# Patient Record
Sex: Female | Born: 1970
Health system: Southern US, Community
[De-identification: ages and names within clinical notes are randomized; demographics above are authoritative.]

## PROBLEM LIST (undated history)

## (undated) DIAGNOSIS — T4145XA Adverse effect of unspecified anesthetic, initial encounter: Secondary | ICD-10-CM

## (undated) DIAGNOSIS — T8859XA Other complications of anesthesia, initial encounter: Secondary | ICD-10-CM

## (undated) DIAGNOSIS — E119 Type 2 diabetes mellitus without complications: Secondary | ICD-10-CM

## (undated) DIAGNOSIS — Z9889 Other specified postprocedural states: Secondary | ICD-10-CM

## (undated) DIAGNOSIS — E785 Hyperlipidemia, unspecified: Secondary | ICD-10-CM

## (undated) DIAGNOSIS — F329 Major depressive disorder, single episode, unspecified: Secondary | ICD-10-CM

## (undated) DIAGNOSIS — J189 Pneumonia, unspecified organism: Secondary | ICD-10-CM

## (undated) DIAGNOSIS — J45909 Unspecified asthma, uncomplicated: Secondary | ICD-10-CM

## (undated) DIAGNOSIS — G4730 Sleep apnea, unspecified: Secondary | ICD-10-CM

## (undated) DIAGNOSIS — K219 Gastro-esophageal reflux disease without esophagitis: Secondary | ICD-10-CM

## (undated) DIAGNOSIS — M549 Dorsalgia, unspecified: Secondary | ICD-10-CM

## (undated) DIAGNOSIS — M2550 Pain in unspecified joint: Secondary | ICD-10-CM

## (undated) DIAGNOSIS — Z8709 Personal history of other diseases of the respiratory system: Secondary | ICD-10-CM

## (undated) DIAGNOSIS — F32A Depression, unspecified: Secondary | ICD-10-CM

## (undated) DIAGNOSIS — E669 Obesity, unspecified: Secondary | ICD-10-CM

## (undated) DIAGNOSIS — R112 Nausea with vomiting, unspecified: Secondary | ICD-10-CM

## (undated) DIAGNOSIS — F419 Anxiety disorder, unspecified: Secondary | ICD-10-CM

## (undated) DIAGNOSIS — K589 Irritable bowel syndrome without diarrhea: Secondary | ICD-10-CM

## (undated) HISTORY — DX: Hyperlipidemia, unspecified: E78.5

## (undated) HISTORY — DX: Dorsalgia, unspecified: M54.9

## (undated) HISTORY — DX: Gastro-esophageal reflux disease without esophagitis: K21.9

## (undated) HISTORY — DX: Sleep apnea, unspecified: G47.30

## (undated) HISTORY — PX: ESOPHAGOPLASTY: SUR459

## (undated) HISTORY — DX: Irritable bowel syndrome, unspecified: K58.9

## (undated) HISTORY — DX: Pain in unspecified joint: M25.50

## (undated) HISTORY — DX: Depression, unspecified: F32.A

## (undated) HISTORY — DX: Major depressive disorder, single episode, unspecified: F32.9

## (undated) HISTORY — PX: TONSILLECTOMY: SUR1361

---
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
## 2005-07-25 ENCOUNTER — Ambulatory Visit (HOSPITAL_COMMUNITY): Admission: RE | Admit: 2005-07-25 | Discharge: 2005-07-25 | Payer: Self-pay | Admitting: Family Medicine

## 2009-12-12 ENCOUNTER — Emergency Department (HOSPITAL_COMMUNITY): Admission: EM | Admit: 2009-12-12 | Discharge: 2009-12-12 | Payer: Self-pay | Admitting: Emergency Medicine

## 2010-01-25 ENCOUNTER — Ambulatory Visit (HOSPITAL_COMMUNITY): Admission: RE | Admit: 2010-01-25 | Discharge: 2010-01-25 | Payer: Self-pay | Admitting: Family Medicine

## 2010-07-02 ENCOUNTER — Emergency Department (HOSPITAL_COMMUNITY): Admission: EM | Admit: 2010-07-02 | Discharge: 2010-07-02 | Payer: Self-pay | Admitting: Family Medicine

## 2010-08-13 ENCOUNTER — Emergency Department (HOSPITAL_COMMUNITY): Admission: EM | Admit: 2010-08-13 | Discharge: 2010-08-13 | Payer: Self-pay | Admitting: Emergency Medicine

## 2010-08-28 ENCOUNTER — Encounter: Admission: RE | Admit: 2010-08-28 | Discharge: 2010-09-26 | Payer: Self-pay | Admitting: Orthopedic Surgery

## 2011-01-29 ENCOUNTER — Ambulatory Visit (HOSPITAL_COMMUNITY)
Admission: RE | Admit: 2011-01-29 | Discharge: 2011-01-29 | Payer: Self-pay | Source: Home / Self Care | Attending: Family Medicine | Admitting: Family Medicine

## 2011-06-19 ENCOUNTER — Inpatient Hospital Stay (INDEPENDENT_AMBULATORY_CARE_PROVIDER_SITE_OTHER)
Admission: RE | Admit: 2011-06-19 | Discharge: 2011-06-19 | Disposition: A | Discharge status: Home / Self Care | Payer: 59 | Source: Ambulatory Visit | Attending: Family Medicine | Admitting: Family Medicine

## 2011-06-19 ENCOUNTER — Inpatient Hospital Stay (HOSPITAL_COMMUNITY)
Admission: RE | Admit: 2011-06-19 | Discharge: 2011-06-19 | Disposition: A | Discharge status: Home / Self Care | Payer: 59 | Source: Ambulatory Visit

## 2011-06-19 DIAGNOSIS — S0083XA Contusion of other part of head, initial encounter: Secondary | ICD-10-CM

## 2011-06-19 DIAGNOSIS — S93409A Sprain of unspecified ligament of unspecified ankle, initial encounter: Secondary | ICD-10-CM

## 2012-01-13 ENCOUNTER — Other Ambulatory Visit (HOSPITAL_COMMUNITY): Payer: Self-pay | Admitting: Family Medicine

## 2012-01-13 DIAGNOSIS — Z1231 Encounter for screening mammogram for malignant neoplasm of breast: Secondary | ICD-10-CM

## 2012-01-15 ENCOUNTER — Encounter (HOSPITAL_COMMUNITY): Payer: Self-pay

## 2012-01-15 ENCOUNTER — Emergency Department (HOSPITAL_COMMUNITY)
Admission: EM | Admit: 2012-01-15 | Discharge: 2012-01-15 | Disposition: A | Discharge status: Home / Self Care | Payer: 59 | Source: Home / Self Care | Attending: Emergency Medicine | Admitting: Emergency Medicine

## 2012-01-15 ENCOUNTER — Other Ambulatory Visit: Payer: Self-pay

## 2012-01-15 DIAGNOSIS — K279 Peptic ulcer, site unspecified, unspecified as acute or chronic, without hemorrhage or perforation: Secondary | ICD-10-CM

## 2012-01-15 DIAGNOSIS — A048 Other specified bacterial intestinal infections: Secondary | ICD-10-CM

## 2012-01-15 HISTORY — DX: Anxiety disorder, unspecified: F41.9

## 2012-01-15 HISTORY — DX: Obesity, unspecified: E66.9

## 2012-01-15 LAB — CBC
HCT: 42.7 % (ref 36.0–46.0)
Hemoglobin: 14.5 g/dL (ref 12.0–15.0)
RBC: 4.9 MIL/uL (ref 3.87–5.11)
WBC: 13.3 10*3/uL — ABNORMAL HIGH (ref 4.0–10.5)

## 2012-01-15 LAB — DIFFERENTIAL
Basophils Absolute: 0.1 10*3/uL (ref 0.0–0.1)
Basophils Relative: 0 % (ref 0–1)
Lymphocytes Relative: 18 % (ref 12–46)
Lymphs Abs: 2.4 10*3/uL (ref 0.7–4.0)
Monocytes Absolute: 0.7 10*3/uL (ref 0.1–1.0)
Monocytes Relative: 5 % (ref 3–12)

## 2012-01-15 LAB — POCT URINALYSIS DIP (DEVICE)
Leukocytes, UA: NEGATIVE
Nitrite: NEGATIVE
Protein, ur: NEGATIVE mg/dL
Specific Gravity, Urine: <=1.005 (ref 1.005–1.030)
Urobilinogen, UA: 0.2 mg/dL (ref 0.0–1.0)

## 2012-01-15 LAB — POCT H PYLORI SCREEN: H. PYLORI SCREEN, POC: POSITIVE — AB

## 2012-01-15 MED ORDER — CLARITHROMYCIN 500 MG PO TABS: 500.0000 mg | ORAL_TABLET | Freq: Two times a day (BID) | ORAL | Status: AC

## 2012-01-15 MED ORDER — OMEPRAZOLE 20 MG PO CPDR: 20.0000 mg | DELAYED_RELEASE_CAPSULE | Freq: Two times a day (BID) | ORAL | Status: DC

## 2012-01-15 MED ORDER — AMOXICILLIN 500 MG PO CAPS: 1000.0000 mg | ORAL_CAPSULE | Freq: Two times a day (BID) | ORAL | Status: AC

## 2012-01-15 NOTE — ED Provider Notes (Signed)
History     CSN: [id number]  Arrival date & time 01/15/12  1110   First MD Initiated Contact with Patient 01/15/12 1115      Chief Complaint  Patient presents with  . Abdominal Pain    (Consider location/radiation/quality/duration/timing/severity/associated sxs/prior treatment) HPI Comments: Pt with intermittent cramping, "gnawing" nonradiating upper abd pain x 3 days. Episodes occur several hours after eating and last for approx 45 min. Had nausea and 2 episodes of nonbloody loose stools this am with sx. Sx have now completely resolved. Pain worse with eating, better with fasting.   Pt did change to high protein, low fat, low carb diet last week in effort to lose more weight. No vomiting, fevers, diaphoresis, CP, SOB, abd distension, urinary c/o, vaginal c/o. Similar sx before, was thought to be gallstones but never had u/s. Has been taking nexium over past 2 days w/o relief.   Patient is a 41 y.o. female presenting with abdominal pain. The history is provided by the patient.  Abdominal Pain The primary symptoms of the illness include abdominal pain. The current episode started more than 2 days ago. The onset of the illness was sudden. The problem has not changed since onset. The patient states that she believes she is currently not pregnant. The patient has not had a change in bowel habit. Symptoms associated with the illness do not include chills, anorexia, diaphoresis, heartburn, constipation, urgency, hematuria, frequency or back pain.    Past Medical History  Diagnosis Date  . Anxiety   . Obesity     Past Surgical History  Procedure Date  . Cesarean section     History reviewed. No pertinent family history.  History  Substance Use Topics  . Smoking status: Former Games developer  . Smokeless tobacco: Not on file  . Alcohol Use: No    OB History    Grav Para Term Preterm Abortions TAB SAB Ect Mult Living                  Review of Systems  Constitutional: Negative for  chills and diaphoresis.  Respiratory: Negative for cough, chest tightness and wheezing.   Cardiovascular: Negative for chest pain.  Gastrointestinal: Positive for abdominal pain. Negative for heartburn, constipation and anorexia.  Genitourinary: Negative for urgency, frequency and hematuria.  Musculoskeletal: Negative for back pain.    Allergies  Sulfa antibiotics  Home Medications   Current Outpatient Rx  Name Route Sig Dispense Refill  . ALPRAZOLAM 0.5 MG PO TABS Oral Take 0.5 mg by mouth at bedtime as needed.    Marland Kitchen FLUOXETINE HCL 20 MG PO CAPS Oral Take 20 mg by mouth daily.    . AMOXICILLIN 500 MG PO CAPS Oral Take 2 capsules (1,000 mg total) by mouth 2 (two) times daily. X 10 days 20 capsule 0  . CLARITHROMYCIN 500 MG PO TABS Oral Take 1 tablet (500 mg total) by mouth 2 (two) times daily. X 10 days 20 tablet 0  . OMEPRAZOLE 20 MG PO CPDR Oral Take 1 capsule (20 mg total) by mouth 2 (two) times daily. X 10 days 40 capsule 0    BP 117/68  Pulse 65  Temp(Src) 97.7 F (36.5 C) (Oral)  SpO2 98%  LMP 01/04/2012  Physical Exam  Nursing note and vitals reviewed. Constitutional: She is oriented to person, place, and time. She appears well-developed and well-nourished.  HENT:  Head: Normocephalic and atraumatic.  Eyes: Conjunctivae and EOM are normal.  Neck: Normal range of motion.  Cardiovascular:  Normal rate, regular rhythm, normal heart sounds and intact distal pulses.   No murmur heard. Pulmonary/Chest: Effort normal and breath sounds normal. No respiratory distress. She has no wheezes. She has no rales. She exhibits no tenderness.  Abdominal: Soft. Normal appearance and bowel sounds are normal. She exhibits no distension and no mass. There is no tenderness. There is no rigidity, no rebound, no guarding, no CVA tenderness and negative Murphy's sign.  Musculoskeletal: Normal range of motion. She exhibits no edema and no tenderness.  Neurological: She is alert and oriented to  person, place, and time.  Skin: Skin is warm and dry.  Psychiatric: She has a normal mood and affect. Her behavior is normal. Judgment and thought content normal.    ED Course  Procedures (including critical care time)  Labs Reviewed  POCT H PYLORI SCREEN - Abnormal; Notable for the following:    H. PYLORI SCREEN, POC POSITIVE (*)    All other components within normal limits  CBC - Abnormal; Notable for the following:    WBC 13.3 (*)    All other components within normal limits  DIFFERENTIAL - Abnormal; Notable for the following:    Neutro Abs 9.9 (*)    All other components within normal limits  POCT URINALYSIS DIP (DEVICE)  LAB REPORT - SCANNED  POCT URINALYSIS DIPSTICK   No results found.   1. H. pylori infection   2. PUD (peptic ulcer disease)      Results for orders placed during the hospital encounter of 01/15/12  POCT H PYLORI SCREEN      Component Value Range   H. PYLORI SCREEN, POC POSITIVE (*) NEGATIVE   CBC      Component Value Range   WBC 13.3 (*) 4.0 - 10.5 (K/uL)   RBC 4.90  3.87 - 5.11 (MIL/uL)   Hemoglobin 14.5  12.0 - 15.0 (g/dL)   HCT 40.9  81.1 - 91.4 (%)   MCV 87.1  78.0 - 100.0 (fL)   MCH 29.6  26.0 - 34.0 (pg)   MCHC 34.0  30.0 - 36.0 (g/dL)   RDW 78.2  95.6 - 21.3 (%)   Platelets 326  150 - 400 (K/uL)  DIFFERENTIAL      Component Value Range   Neutrophils Relative 75  43 - 77 (%)   Neutro Abs 9.9 (*) 1.7 - 7.7 (K/uL)   Lymphocytes Relative 18  12 - 46 (%)   Lymphs Abs 2.4  0.7 - 4.0 (K/uL)   Monocytes Relative 5  3 - 12 (%)   Monocytes Absolute 0.7  0.1 - 1.0 (K/uL)   Eosinophils Relative 1  0 - 5 (%)   Eosinophils Absolute 0.2  0.0 - 0.7 (K/uL)   Basophils Relative 0  0 - 1 (%)   Basophils Absolute 0.1  0.0 - 0.1 (K/uL)  POCT URINALYSIS DIP (DEVICE)      Component Value Range   Glucose, UA NEGATIVE  NEGATIVE (mg/dL)   Bilirubin Urine NEGATIVE  NEGATIVE    Ketones, ur NEGATIVE  NEGATIVE (mg/dL)   Specific Gravity, Urine <=1.005   1.005 - 1.030    Hgb urine dipstick NEGATIVE  NEGATIVE    pH 6.0  5.0 - 8.0    Protein, ur NEGATIVE  NEGATIVE (mg/dL)   Urobilinogen, UA 0.2  0.0 - 1.0 (mg/dL)   Nitrite NEGATIVE  NEGATIVE    Leukocytes, UA NEGATIVE  NEGATIVE     MDM  Previous chart, labs, imaging reviewed.  Date: 01/15/2012  Rate: 69  Rhythm: normal sinus rhythm  QRS Axis: normal  Intervals: normal  ST/T Wave abnormalities: normal  Conduction Disutrbances:none  Narrative Interpretation:   Old EKG Reviewed: none available  Checking EKG, CBC, h pylori. No jaundice. If all neg think that this is most likely cholelithiasis as pt is in 40's, parous, overweight. Pt currenly pain free, VS acceptable, think that pt can have OP workup if no red flags here by PMD. Patient led H. pylori gastritis. Sending home with amoxicillin Biaxin and Prilosec.   On re-evaluation, pt comfortable, VSS, no complaints.   Discussed lab results with patient Emphasized importance of f/u. Pt  agrees.   PMD- Dr. Keturah Barre in [facility]  Luiz Blare, MD 01/16/12 [phone]

## 2012-01-15 NOTE — ED Notes (Signed)
States has been having upper abdominal pain w radiation to mid abdomin, comes and goes for several days; c/o pain @ 10 when comes on, goes away after 30-45 min w rest and heat; has lost weight w diet and exercise; NAD at present, denies pain at present

## 2012-02-11 ENCOUNTER — Ambulatory Visit (HOSPITAL_COMMUNITY)
Admission: RE | Admit: 2012-02-11 | Discharge: 2012-02-11 | Disposition: A | Discharge status: Home / Self Care | Payer: 59 | Source: Ambulatory Visit | Attending: Family Medicine | Admitting: Family Medicine

## 2012-02-11 DIAGNOSIS — Z1231 Encounter for screening mammogram for malignant neoplasm of breast: Secondary | ICD-10-CM | POA: Insufficient documentation

## 2012-02-18 ENCOUNTER — Other Ambulatory Visit: Payer: Self-pay | Admitting: Family Medicine

## 2012-02-18 DIAGNOSIS — R928 Other abnormal and inconclusive findings on diagnostic imaging of breast: Secondary | ICD-10-CM

## 2012-02-24 ENCOUNTER — Encounter (HOSPITAL_COMMUNITY): Payer: Self-pay | Admitting: Emergency Medicine

## 2012-02-24 ENCOUNTER — Emergency Department (HOSPITAL_COMMUNITY)
Admission: EM | Admit: 2012-02-24 | Discharge: 2012-02-24 | Disposition: A | Discharge status: Home / Self Care | Payer: 59 | Source: Home / Self Care | Attending: Emergency Medicine | Admitting: Emergency Medicine

## 2012-02-24 DIAGNOSIS — J069 Acute upper respiratory infection, unspecified: Secondary | ICD-10-CM

## 2012-02-24 MED ORDER — AMOXICILLIN-POT CLAVULANATE 875-125 MG PO TABS: 1.0000 | ORAL_TABLET | Freq: Two times a day (BID) | ORAL | Status: AC

## 2012-02-24 MED ORDER — FLUTICASONE PROPIONATE 50 MCG/ACT NA SUSP: 2.0000 | Freq: Every day | NASAL | Status: DC

## 2012-02-24 NOTE — ED Notes (Signed)
Pt  Has  Symptoms of  Congestion  As  Well   As   sorethroat     And  Earache    Symptoms  X  3  Days   Pt  Is sitting upright on  Exam table  Appears in no   Severe  Distress     Speaking in  Complete  sentances   Symptoms  X  3  Days

## 2012-02-24 NOTE — ED Provider Notes (Signed)
Chief Complaint  Patient presents with  . Otalgia    History of Present Illness:   The patient is a 41 year old female who presents today with a four-day history of left ear fullness, nasal congestion, clear rhinorrhea and sneezing. She also has had a slight sore throat, postnasal drip, dry cough and some wheezing. She denies any fever or chills. She denies any pain in the ear.  Review of Systems:  Other than noted above, the patient denies any of the following symptoms. Systemic:  No fever, chills, sweats, fatigue, myalgias, headache, or anorexia. Eye:  No redness, pain or drainage. ENT:  No earache, nasal congestion, rhinorrhea, sinus pressure, or sore throat. Lungs:  No cough, sputum production, wheezing, shortness of breath. Or chest pain. GI:  No nausea, vomiting, abdominal pain or diarrhea. Skin:  No rash or itching.  PMFSH:  Past medical history, family history, social history, meds, and allergies were reviewed.  Physical Exam:   Vital signs:  BP 112/59  Pulse 70  Temp(Src) 98.1 F (36.7 C) (Oral)  Resp 16  SpO2 100%  LMP 02/01/2012 General:  Alert, in no distress. Eye:  No conjunctival injection or drainage. ENT:  TMs and canals were normal, without erythema or inflammation.  Nasal mucosa was congested, without drainage.  Mucous membranes were moist.  Pharynx was clear, without exudate or drainage.  There were no oral ulcerations or lesions. Neck:  Supple, no adenopathy, tenderness or mass. Lungs:  No respiratory distress.  Lungs were clear to auscultation, without wheezes, rales or rhonchi.  Breath sounds were clear and equal bilaterally. Heart:  Regular rhythm, without gallops, murmers or rubs. Skin:  Clear, warm, and dry, without rash or lesions.   Assessment:   Diagnoses that have been ruled out:  None  Diagnoses that are still under consideration:  None  Final diagnoses:  Viral upper respiratory infection      Plan:   1.  The following meds were prescribed:     New Prescriptions   AMOXICILLIN-CLAVULANATE (AUGMENTIN) 875-125 MG PER TABLET    Take 1 tablet by mouth 2 (two) times daily.   FLUTICASONE (FLONASE) 50 MCG/ACT NASAL SPRAY    Place 2 sprays into the nose daily.   2.  The patient was instructed in symptomatic care and handouts were given. 3.  The patient was told to return if becoming worse in any way, if no better in 3 or 4 days, and given some red flag symptoms that would indicate earlier return.   Roque Lias, MD 02/24/12 [phone]

## 2012-02-24 NOTE — Discharge Instructions (Signed)

## 2012-02-26 ENCOUNTER — Ambulatory Visit
Admission: RE | Admit: 2012-02-26 | Discharge: 2012-02-26 | Disposition: A | Discharge status: Home / Self Care | Payer: 59 | Source: Ambulatory Visit | Attending: Family Medicine | Admitting: Family Medicine

## 2012-02-26 DIAGNOSIS — R928 Other abnormal and inconclusive findings on diagnostic imaging of breast: Secondary | ICD-10-CM

## 2012-09-10 ENCOUNTER — Emergency Department (HOSPITAL_COMMUNITY)
Admission: EM | Admit: 2012-09-10 | Discharge: 2012-09-10 | Disposition: A | Discharge status: Home / Self Care | Payer: 59 | Source: Home / Self Care | Attending: Family Medicine | Admitting: Family Medicine

## 2012-09-10 ENCOUNTER — Encounter (HOSPITAL_COMMUNITY): Payer: Self-pay | Admitting: Emergency Medicine

## 2012-09-10 DIAGNOSIS — L089 Local infection of the skin and subcutaneous tissue, unspecified: Secondary | ICD-10-CM

## 2012-09-10 MED ORDER — DOXYCYCLINE HYCLATE 100 MG PO CAPS: 100.0000 mg | ORAL_CAPSULE | Freq: Two times a day (BID) | ORAL | Status: DC

## 2012-09-10 MED ORDER — IBUPROFEN 800 MG PO TABS: 800.0000 mg | ORAL_TABLET | Freq: Three times a day (TID) | ORAL | Status: AC

## 2012-09-10 MED ORDER — IBUPROFEN 800 MG PO TABS: 800.0000 mg | ORAL_TABLET | Freq: Three times a day (TID) | ORAL | Status: DC

## 2012-09-10 MED ORDER — CEPHALEXIN 500 MG PO CAPS: 500.0000 mg | ORAL_CAPSULE | Freq: Four times a day (QID) | ORAL | Status: AC

## 2012-09-10 MED ORDER — DOXYCYCLINE HYCLATE 100 MG PO CAPS: 100.0000 mg | ORAL_CAPSULE | Freq: Two times a day (BID) | ORAL | Status: AC

## 2012-09-10 MED ORDER — CEPHALEXIN 500 MG PO CAPS: 500.0000 mg | ORAL_CAPSULE | Freq: Four times a day (QID) | ORAL | Status: DC

## 2012-09-10 NOTE — ED Notes (Signed)
PT HERE WITH C/O POSS PARONYCHIA TO RIGHT HAND MIDDLE DISTAL FINGER THAT NOTICED THIS AM.SOAKED IN WARM WATER BUT THROB PAIN NOTED

## 2012-09-10 NOTE — ED Provider Notes (Signed)
History     CSN: [id number]  Arrival date & time 09/10/12  4098   First MD Initiated Contact with Patient 09/10/12 1846      Chief Complaint  Patient presents with  . Nail Problem    (Consider location/radiation/quality/duration/timing/severity/associated sxs/prior treatment) Patient is a 41 y.o. female presenting with hand pain. The history is provided by the patient. No language interpreter was used.  Hand Pain This is a new problem. The current episode started yesterday. The problem occurs constantly. The problem has been gradually worsening. Nothing aggravates the symptoms. Nothing relieves the symptoms. She has tried nothing for the symptoms. The treatment provided no relief.  Pt complains of swelling to his right 3rd finger.  Past Medical History  Diagnosis Date  . Anxiety   . Obesity     Past Surgical History  Procedure Date  . Cesarean section     No family history on file.  History  Substance Use Topics  . Smoking status: Former Games developer  . Smokeless tobacco: Not on file  . Alcohol Use: No    OB History    Grav Para Term Preterm Abortions TAB SAB Ect Mult Living                  Review of Systems  Musculoskeletal: Positive for joint swelling.  All other systems reviewed and are negative.    Allergies  Sulfa antibiotics  Home Medications   Current Outpatient Rx  Name Route Sig Dispense Refill  . ALPRAZOLAM 0.5 MG PO TABS Oral Take 0.5 mg by mouth at bedtime as needed.    . CEPHALEXIN 500 MG PO CAPS Oral Take 1 capsule (500 mg total) by mouth 4 (four) times daily. 40 capsule 0  . DOXYCYCLINE HYCLATE 100 MG PO CAPS Oral Take 1 capsule (100 mg total) by mouth 2 (two) times daily. 20 capsule 0  . FLUOXETINE HCL 20 MG PO CAPS Oral Take 20 mg by mouth daily.    Marland Kitchen FLUTICASONE PROPIONATE 50 MCG/ACT NA SUSP Nasal Place 2 sprays into the nose daily. 16 g 0  . IBUPROFEN 800 MG PO TABS Oral Take 1 tablet (800 mg total) by mouth 3 (three) times daily. 21  tablet 0  . OMEPRAZOLE 20 MG PO CPDR Oral Take 1 capsule (20 mg total) by mouth 2 (two) times daily. X 10 days 40 capsule 0    BP 121/78  Pulse 78  Temp 98.9 F (37.2 C) (Oral)  Resp 14  SpO2 97%  LMP 09/10/2012  Physical Exam  Nursing note and vitals reviewed. Constitutional: She is oriented to person, place, and time. She appears well-developed and well-nourished.  HENT:  Head: Normocephalic and atraumatic.  Musculoskeletal: She exhibits tenderness.       Tender middle finger,  Slight erythema distal tip,  No obvious paronychia  nv and ns intact  Neurological: She is alert and oriented to person, place, and time. She has normal reflexes.  Skin: Skin is warm and dry.  Psychiatric: She has a normal mood and affect.    ED Course  Procedures (including critical care time)  Labs Reviewed - No data to display No results found.   1. Finger infection       MDM  Pt advised soak area, recheck in 2 days,  Keflex and doxycycline        Lonia Skinner [place], [place] 09/10/12 2022

## 2012-09-12 NOTE — ED Provider Notes (Signed)
Medical screening examination/treatment/procedure(s) were performed by resident physician or non-physician practitioner and as supervising physician I was immediately available for consultation/collaboration.   Severo Beber DOUGLAS MD.    Valaree Fresquez D Patience Nuzzo, MD 09/12/12 1046 

## 2012-09-13 ENCOUNTER — Encounter (HOSPITAL_COMMUNITY): Payer: Self-pay | Admitting: *Deleted

## 2012-09-13 ENCOUNTER — Emergency Department (HOSPITAL_COMMUNITY)
Admission: EM | Admit: 2012-09-13 | Discharge: 2012-09-13 | Disposition: A | Discharge status: Home / Self Care | Payer: 59 | Source: Home / Self Care | Attending: Family Medicine | Admitting: Family Medicine

## 2012-09-13 DIAGNOSIS — L03019 Cellulitis of unspecified finger: Secondary | ICD-10-CM

## 2012-09-13 DIAGNOSIS — IMO0001 Reserved for inherently not codable concepts without codable children: Secondary | ICD-10-CM

## 2012-09-13 MED ORDER — TRAMADOL HCL 50 MG PO TABS: 50.0000 mg | ORAL_TABLET | Freq: Four times a day (QID) | ORAL | Status: AC | PRN

## 2012-09-13 MED ORDER — HYDROCODONE-ACETAMINOPHEN 5-325 MG PO TABS: 1.0000 | ORAL_TABLET | Freq: Four times a day (QID) | ORAL | Status: AC | PRN

## 2012-09-13 NOTE — ED Notes (Signed)
Betadine soak. 

## 2012-09-13 NOTE — ED Provider Notes (Signed)
History     CSN: [id number]  Arrival date & time 09/13/12  1113   First MD Initiated Contact with Patient 09/13/12 1115      Chief Complaint  Patient presents with  . Wound Check    (Consider location/radiation/quality/duration/timing/severity/associated sxs/prior treatment) Patient is a 41 y.o. female presenting with hand pain. The history is provided by the patient.  Hand Pain This is a new problem. The current episode started more than 2 days ago (seen 9/12 for paronychia of rmf, sx continue.). The problem has not changed since onset.   Past Medical History  Diagnosis Date  . Anxiety   . Obesity     Past Surgical History  Procedure Date  . Cesarean section     History reviewed. No pertinent family history.  History  Substance Use Topics  . Smoking status: Former Games developer  . Smokeless tobacco: Not on file  . Alcohol Use: No    OB History    Grav Para Term Preterm Abortions TAB SAB Ect Mult Living                  Review of Systems  Constitutional: Negative.   Skin: Positive for rash.    Allergies  Rifampin and Sulfa antibiotics  Home Medications   Current Outpatient Rx  Name Route Sig Dispense Refill  . ALPRAZOLAM 0.5 MG PO TABS Oral Take 0.5 mg by mouth at bedtime as needed.    . CEPHALEXIN 500 MG PO CAPS Oral Take 1 capsule (500 mg total) by mouth 4 (four) times daily. 40 capsule 0  . DOXYCYCLINE HYCLATE 100 MG PO CAPS Oral Take 1 capsule (100 mg total) by mouth 2 (two) times daily. 20 capsule 0  . ESOMEPRAZOLE MAGNESIUM 40 MG PO CPDR Oral Take 40 mg by mouth daily before breakfast.    . FLUOXETINE HCL 20 MG PO CAPS Oral Take 20 mg by mouth daily.    . IBUPROFEN 800 MG PO TABS Oral Take 1 tablet (800 mg total) by mouth 3 (three) times daily. 21 tablet 0  . OMEPRAZOLE 20 MG PO CPDR Oral Take 1 capsule (20 mg total) by mouth 2 (two) times daily. X 10 days 40 capsule 0  . FLUTICASONE PROPIONATE 50 MCG/ACT NA SUSP Nasal Place 2 sprays into the nose  daily. 16 g 0  . HYDROCODONE-ACETAMINOPHEN 5-325 MG PO TABS Oral Take 1 tablet by mouth every 6 (six) hours as needed for pain. 10 tablet 0    BP 112/67  Pulse 64  Temp 98.4 F (36.9 C) (Oral)  Resp 18  SpO2 99%  LMP 09/10/2012  Physical Exam  Nursing note and vitals reviewed. Constitutional: She is oriented to person, place, and time. She appears well-developed and well-nourished.  Musculoskeletal: She exhibits tenderness.       Hands: Neurological: She is alert and oriented to person, place, and time.  Skin: Skin is warm and dry.    ED Course  Drain paronychia Date/Time: 09/13/2012 12:07 PM Performed by: Linna Hoff Authorized by: Bradd Canary D Consent: Verbal consent obtained. Consent given by: patient Preparation: Patient was prepped and draped in the usual sterile fashion. Local anesthesia used: yes Anesthesia: local infiltration Local anesthetic: lidocaine 2% without epinephrine Patient sedated: no Patient tolerance: Patient tolerated the procedure well with no immediate complications. Comments: Culture obtained, betadine soaked., dsd   (including critical care time)   Labs Reviewed  WOUND CULTURE   No results found.   1. Paronychia of third finger,  right       MDM  I+d performed        Linna Hoff, MD 09/13/12 1212

## 2012-09-13 NOTE — ED Notes (Signed)
Here for recheck infection right middle finger - tenderness and swelling remain

## 2012-09-15 LAB — WOUND CULTURE

## 2013-03-01 ENCOUNTER — Other Ambulatory Visit (HOSPITAL_COMMUNITY): Payer: Self-pay | Admitting: Family Medicine

## 2013-03-01 DIAGNOSIS — Z1231 Encounter for screening mammogram for malignant neoplasm of breast: Secondary | ICD-10-CM

## 2013-03-09 ENCOUNTER — Ambulatory Visit (HOSPITAL_COMMUNITY)
Admission: RE | Admit: 2013-03-09 | Discharge: 2013-03-09 | Disposition: A | Discharge status: Home / Self Care | Payer: 59 | Source: Ambulatory Visit | Attending: Family Medicine | Admitting: Family Medicine

## 2013-03-09 DIAGNOSIS — Z1231 Encounter for screening mammogram for malignant neoplasm of breast: Secondary | ICD-10-CM | POA: Insufficient documentation

## 2014-02-24 ENCOUNTER — Other Ambulatory Visit (HOSPITAL_COMMUNITY): Payer: Self-pay | Admitting: Family Medicine

## 2014-02-24 DIAGNOSIS — Z1231 Encounter for screening mammogram for malignant neoplasm of breast: Secondary | ICD-10-CM

## 2014-03-14 ENCOUNTER — Ambulatory Visit (HOSPITAL_COMMUNITY)
Admission: RE | Admit: 2014-03-14 | Discharge: 2014-03-14 | Disposition: A | Discharge status: Home / Self Care | Payer: 59 | Source: Ambulatory Visit | Attending: Family Medicine | Admitting: Family Medicine

## 2014-03-14 DIAGNOSIS — Z1231 Encounter for screening mammogram for malignant neoplasm of breast: Secondary | ICD-10-CM

## 2014-06-02 ENCOUNTER — Ambulatory Visit (INDEPENDENT_AMBULATORY_CARE_PROVIDER_SITE_OTHER): Payer: 59 | Admitting: Family Medicine

## 2014-06-02 VITALS — BP 126/74 | HR 86 | Temp 97.9°F | Resp 16 | Ht 62.0 in | Wt 255.6 lb

## 2014-06-02 DIAGNOSIS — M25519 Pain in unspecified shoulder: Secondary | ICD-10-CM

## 2014-06-02 DIAGNOSIS — M67919 Unspecified disorder of synovium and tendon, unspecified shoulder: Secondary | ICD-10-CM

## 2014-06-02 DIAGNOSIS — M25511 Pain in right shoulder: Secondary | ICD-10-CM

## 2014-06-02 DIAGNOSIS — M719 Bursopathy, unspecified: Secondary | ICD-10-CM

## 2014-06-02 DIAGNOSIS — M7581 Other shoulder lesions, right shoulder: Secondary | ICD-10-CM

## 2014-06-02 DIAGNOSIS — E669 Obesity, unspecified: Secondary | ICD-10-CM | POA: Insufficient documentation

## 2014-06-02 MED ORDER — METHOCARBAMOL 500 MG PO TABS: 500.0000 mg | ORAL_TABLET | Freq: Three times a day (TID) | ORAL | Status: DC | PRN

## 2014-06-02 MED ORDER — MELOXICAM 7.5 MG PO TABS: 7.5000 mg | ORAL_TABLET | Freq: Every day | ORAL | Status: DC

## 2014-06-02 NOTE — Progress Notes (Signed)
[facility] [address] [place] [address] [phone]  Date:  06/02/2014   Name:  Stacey Clark   DOB:  [birth year]   MRN:  [id number]  PCP:  Myrlene Broker, MD    Chief Complaint: Shoulder Pain   History of Present Illness:  Stacey Clark is a 43 y.o. very pleasant female patient who presents with the following:  She is here today with a problem with her right shoulder.  She notes pain especially with abduction and flexion.   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   She had noted trouble with her shoulder for a few months, but just worse now over the last couple of days.   She is right handed.   She can have more pain when she lies down at night Taking OTC medications as needed No prior significant histoyr of shoulder trouble, no acute injury.    History of DM but no medicaitons at thsi time.    There are no active problems to display for this patient.   Past Medical History  Diagnosis Date  . Anxiety   . Obesity     Past Surgical History  Procedure Laterality Date  . Cesarean section      History  Substance Use Topics  . Smoking status: Former Research scientist (life sciences)  . Smokeless tobacco: Not on file  . Alcohol Use: No    No family history on file.  Allergies  Allergen Reactions  . Rifampin   . Sulfa Antibiotics     Medication list has been reviewed and updated.  Current Outpatient Prescriptions on File Prior to Visit  Medication Sig Dispense Refill  . ALPRAZolam (XANAX) 0.5 MG tablet Take 0.5 mg by mouth at bedtime as needed.      Marland Kitchen esomeprazole (NEXIUM) 40 MG capsule Take 40 mg by mouth daily before breakfast.      . FLUoxetine (PROZAC) 20 MG capsule Take 20 mg by mouth daily.      . fluticasone (FLONASE) 50 MCG/ACT nasal spray Place 2 sprays into the nose daily.  16 g  0  . omeprazole (PRILOSEC) 20 MG capsule Take 1 capsule (20 mg total) by mouth 2 (two) times daily. X 10 days   40 capsule  0   No current facility-administered medications on file prior to visit.    Review of Systems:  As per HPI- otherwise negative.   Physical Examination: Filed Vitals:   06/02/14 1615  BP: 126/74  Pulse: 86  Temp: 97.9 F (36.6 C)  Resp: 16   Filed Vitals:   06/02/14 1615  Height: 5\' 2"  (1.575 m)  Weight: 255 lb 9.6 oz (115.939 kg)   Body mass index is 46.74 kg/(m^2). Ideal Body Weight: Weight in (lb) to have BMI = 25: 136.4  GEN: WDWN, NAD, Non-toxic, A & O x 3, obese, looks well HEENT: Atraumatic, Normocephalic. Neck supple. No masses, No LAD. Ears and Nose: No external deformity. CV: RRR, No M/G/R. No JVD. No thrill. No extra heart sounds. PULM: CTA B, no wheezes, crackles, rhonchi. No retractions. No resp. distress. No accessory muscle use. EXTR: No c/c/e NEURO Normal gait.  PSYCH: Normally interactive. Conversant. Not depressed or anxious appearing.  Calm demeanor.  Right shoulder; she is tender over the anterior RCT insertion.  Able to flex to about 90 before she has pain. Abduct to 110 before she has pain.  External  rotation is ok, internal rotation is painful.  Normal strength, negative empty can test.    Assessment and Plan: Right rotator cuff tendonitis - Plan: meloxicam (MOBIC) 7.5 MG tablet, methocarbamol (ROBAXIN) 500 MG tablet  Right shoulder pain  Right shoulder strain.  Treat with mobic and robaxin as needed.  Gave hand- out with RCT exercises.   See patient instructions for more details.     Signed Lamar Blinks, MD

## 2014-06-02 NOTE — Patient Instructions (Signed)
Use your mobic (NSAID) once a day as needed- do not take with ibuprofen or aleve.   Also use the robaxin (muscle relaxer) as needed.  Remember it can make you a bit sleepy, especially if combined with xanax.    Avoid lifting your arms over your heartline when you are working.  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  Use the exercises on a regular basis, and let me know if you want to pursue formal physical therapy or an orthopedist visit in a couple of weeks

## 2014-11-27 ENCOUNTER — Ambulatory Visit (INDEPENDENT_AMBULATORY_CARE_PROVIDER_SITE_OTHER): Payer: 59 | Admitting: Family Medicine

## 2014-11-27 VITALS — BP 136/78 | HR 77 | Temp 97.9°F | Resp 16 | Ht 62.5 in | Wt 259.8 lb

## 2014-11-27 DIAGNOSIS — R05 Cough: Secondary | ICD-10-CM

## 2014-11-27 DIAGNOSIS — J069 Acute upper respiratory infection, unspecified: Secondary | ICD-10-CM

## 2014-11-27 DIAGNOSIS — R059 Cough, unspecified: Secondary | ICD-10-CM

## 2014-11-27 MED ORDER — BENZONATATE 100 MG PO CAPS: 100.0000 mg | ORAL_CAPSULE | Freq: Three times a day (TID) | ORAL | Status: DC | PRN

## 2014-11-27 MED ORDER — AZITHROMYCIN 250 MG PO TABS: ORAL_TABLET | ORAL | Status: DC

## 2014-11-27 NOTE — Progress Notes (Signed)
Subjective:    Patient ID: Stacey Clark, female    DOB: 1971-07-09, 43 y.o.   MRN: [id number] This chart was scribed for Stacey Ray, MD by Marti Sleigh, Medical Scribe. This patient was seen in Room 1 and the patient's care was started a 11:23 AM.    HPI HPI Comments: Stacey Clark is a 43 y.o. female who presents to [facility] complaining of productive sinus congestion with yellow purulent that started three days ago. Pt endorses ear pain, mild cough, swollen glands, and sick contacts. Pt's niece and nephew have both had recent URI with abx treatment. Pt states that she had slight trouble breathing last night - congested, but no wheeze.  Pt denies fever or SOB. Pt has been taking advil sinus and congestion OTC.     Patient Active Problem List   Diagnosis Date Noted  . Obesity, unspecified 06/02/2014   Past Medical History  Diagnosis Date  . Anxiety   . Obesity    Past Surgical History  Procedure Laterality Date  . Cesarean section     Allergies  Allergen Reactions  . Rifampin   . Sulfa Antibiotics    Prior to Admission medications   Medication Sig Start Date End Date Taking? Authorizing Provider  ALPRAZolam Duanne Moron) 0.5 MG tablet Take 0.5 mg by mouth at bedtime as needed.   Yes Historical Provider, MD  buPROPion (WELLBUTRIN SR) 150 MG 12 hr tablet Take 150 mg by mouth 2 (two) times daily.   Yes Historical Provider, MD  escitalopram (LEXAPRO) 5 MG tablet Take 5 mg by mouth daily.   Yes Historical Provider, MD  esomeprazole (NEXIUM) 40 MG capsule Take 40 mg by mouth daily before breakfast.   Yes Historical Provider, MD  fluticasone (FLONASE) 50 MCG/ACT nasal spray Place 2 sprays into the nose daily. 02/24/12 02/23/13  Harden Mo, MD  meloxicam (MOBIC) 7.5 MG tablet Take 1 tablet (7.5 mg total) by mouth daily. Use as needed for shoulder pain Patient not taking: Reported on 11/27/2014 06/02/14   Darreld Mclean, MD  methocarbamol (ROBAXIN) 500 MG tablet Take 1 tablet (500 mg  total) by mouth every 8 (eight) hours as needed for muscle spasms. Patient not taking: Reported on 11/27/2014 06/02/14   Darreld Mclean, MD  omeprazole (PRILOSEC) 20 MG capsule Take 1 capsule (20 mg total) by mouth 2 (two) times daily. X 10 days 01/15/12 01/14/13  Melynda Ripple, MD   History   Social History  . Marital Status: Married    Spouse Name: N/A    Number of Children: N/A  . Years of Education: N/A   Occupational History  . Not on file.   Social History Main Topics  . Smoking status: Former Research scientist (life sciences)  . Smokeless tobacco: Not on file  . Alcohol Use: No  . Drug Use: No  . Sexual Activity: Not on file   Other Topics Concern  . Not on file   Social History Narrative    Review of Systems  Constitutional: Positive for fatigue. Negative for fever.  HENT: Positive for congestion, ear pain, postnasal drip, rhinorrhea and sinus pressure.   Respiratory: Positive for cough.   Neurological: Positive for headaches.       Objective:   Physical Exam  Constitutional: She is oriented to person, place, and time. She appears well-developed and well-nourished. No distress.  HENT:  Head: Normocephalic and atraumatic.  Right Ear: Hearing, tympanic membrane, external ear and ear canal normal.  Left Ear: Hearing, tympanic  membrane, external ear and ear canal normal.  Nose: Nose normal.  Mouth/Throat: Oropharynx is clear and moist. No oropharyngeal exudate.  No sinus tenderness.  Eyes: Conjunctivae and EOM are normal. Pupils are equal, round, and reactive to light.  Neck: Neck supple.  Cardiovascular: Normal rate, regular rhythm, normal heart sounds and intact distal pulses.   No murmur heard. Pulmonary/Chest: Effort normal and breath sounds normal. No respiratory distress. She has no wheezes. She has no rhonchi.  Lymphadenopathy:    She has no cervical adenopathy.  Neurological: She is alert and oriented to person, place, and time.  Skin: Skin is warm and dry. No rash noted.    Psychiatric: She has a normal mood and affect. Her behavior is normal.  Vitals reviewed.    Filed Vitals:   11/27/14 1026  BP: 136/78  Pulse: 77  Temp: 97.9 F (36.6 C)  TempSrc: Oral  Resp: 16  Height: 5' 2.5" (1.588 m)  Weight: 259 lb 12.8 oz (117.845 kg)  SpO2: 96%       Assessment & Plan:   Stacey Clark is a 43 y.o. female Acute upper respiratory infection  Cough - Plan: benzonatate (TESSALON) 100 MG capsule, azithromycin (ZITHROMAX) 250 MG tablet Suspected viral URI. Sx care discussed with Tessalon, Mucinex (samples #8 given), saline NS. Discussed typical self-limited illness, but if not improving in next week - Z pack for sinobronchitis given.  Initially she asked about steroid injection to help with symptomatic improvement quicker, but discussed reasons for not using this at present time.  RTC precautions discussed.   Meds ordered this encounter  . benzonatate (TESSALON) 100 MG capsule    Sig: Take 1 capsule (100 mg total) by mouth 3 (three) times daily as needed for cough.    Dispense:  20 capsule    Refill:  0  . azithromycin (ZITHROMAX) 250 MG tablet    Sig: Take 2 pills by mouth on day 1, then 1 pill by mouth per day on days 2 through 5.    Dispense:  6 tablet    Refill:  0   Patient Instructions  Saline nasal spray atleast 4 times per day, over the counter mucinex or mucinex DM, or tessalon perles if needed for cough, drink plenty of fluids and rest as much as possible. If your cough is not starting to improve in next week - can fill prescription for antibiotic as discussed, but Return to the clinic or go to the nearest emergency room if any of your symptoms worsen or new symptoms occur. If coughing is keeping you awake at night - let me know and I can prescribe stronger medicine if needed.   Upper Respiratory Infection, Adult An upper respiratory infection (URI) is also sometimes known as the common cold. The upper respiratory tract includes the nose,  sinuses, throat, trachea, and bronchi. Bronchi are the airways leading to the lungs. Most people improve within 1 week, but symptoms can last up to 2 weeks. A residual cough may last even longer.  CAUSES Many different viruses can infect the tissues lining the upper respiratory tract. The tissues become irritated and inflamed and often become very moist. Mucus production is also common. A cold is contagious. You can easily spread the virus to others by oral contact. This includes kissing, sharing a glass, coughing, or sneezing. Touching your mouth or nose and then touching a surface, which is then touched by another person, can also spread the virus. SYMPTOMS  Symptoms typically develop 1  to 3 days after you come in contact with a cold virus. Symptoms vary from person to person. They may include:  Runny nose.  Sneezing.  Nasal congestion.  Sinus irritation.  Sore throat.  Loss of voice (laryngitis).  Cough.  Fatigue.  Muscle aches.  Loss of appetite.  Headache.  Low-grade fever. DIAGNOSIS  You might diagnose your own cold based on familiar symptoms, since most people get a cold 2 to 3 times a year. Your caregiver can confirm this based on your exam. Most importantly, your caregiver can check that your symptoms are not due to another disease such as strep throat, sinusitis, pneumonia, asthma, or epiglottitis. Blood tests, throat tests, and X-rays are not necessary to diagnose a common cold, but they may sometimes be helpful in excluding other more serious diseases. Your caregiver will decide if any further tests are required. RISKS AND COMPLICATIONS  You may be at risk for a more severe case of the common cold if you smoke cigarettes, have chronic heart disease (such as heart failure) or lung disease (such as asthma), or if you have a weakened immune system. The very young and very old are also at risk for more serious infections. Bacterial sinusitis, middle ear infections, and  bacterial pneumonia can complicate the common cold. The common cold can worsen asthma and chronic obstructive pulmonary disease (COPD). Sometimes, these complications can require emergency medical care and may be life-threatening. PREVENTION  The best way to protect against getting a cold is to practice good hygiene. Avoid oral or hand contact with people with cold symptoms. Wash your hands often if contact occurs. There is no clear evidence that vitamin C, vitamin E, echinacea, or exercise reduces the chance of developing a cold. However, it is always recommended to get plenty of rest and practice good nutrition. TREATMENT  Treatment is directed at relieving symptoms. There is no cure. Antibiotics are not effective, because the infection is caused by a virus, not by bacteria. Treatment may include:  Increased fluid intake. Sports drinks offer valuable electrolytes, sugars, and fluids.  Breathing heated mist or steam (vaporizer or shower).  Eating chicken soup or other clear broths, and maintaining good nutrition.  Getting plenty of rest.  Using gargles or lozenges for comfort.  Controlling fevers with ibuprofen or acetaminophen as directed by your caregiver.  Increasing usage of your inhaler if you have asthma. Zinc gel and zinc lozenges, taken in the first 24 hours of the common cold, can shorten the duration and lessen the severity of symptoms. Pain medicines may help with fever, muscle aches, and throat pain. A variety of non-prescription medicines are available to treat congestion and runny nose. Your caregiver can make recommendations and may suggest nasal or lung inhalers for other symptoms.  HOME CARE INSTRUCTIONS   Only take over-the-counter or prescription medicines for pain, discomfort, or fever as directed by your caregiver.  Use a warm mist humidifier or inhale steam from a shower to increase air moisture. This may keep secretions moist and make it easier to breathe.  Drink  enough water and fluids to keep your urine clear or pale yellow.  Rest as needed.  Return to work when your temperature has returned to normal or as your caregiver advises. You may need to stay home longer to avoid infecting others. You can also use a face mask and careful hand washing to prevent spread of the virus. SEEK MEDICAL CARE IF:   After the first few days, you feel you are  getting worse rather than better.  You need your caregiver's advice about medicines to control symptoms.  You develop chills, worsening shortness of breath, or brown or red sputum. These may be signs of pneumonia.  You develop yellow or brown nasal discharge or pain in the face, especially when you bend forward. These may be signs of sinusitis.  You develop a fever, swollen neck glands, pain with swallowing, or white areas in the back of your throat. These may be signs of strep throat. SEEK IMMEDIATE MEDICAL CARE IF:   You have a fever.  You develop severe or persistent headache, ear pain, sinus pain, or chest pain.  You develop wheezing, a prolonged cough, cough up blood, or have a change in your usual mucus (if you have chronic lung disease).  You develop sore muscles or a stiff neck. Document Released: 06/11/2001 Document Revised: 03/09/2012 Document Reviewed: 03/23/2014 [facility] Patient Information 2015 [place], [place]. This information is not intended to replace advice given to you by your health care provider. Make sure you discuss any questions you have with your health care provider.   Cough, Adult  A cough is a reflex that helps clear your throat and airways. It can help heal the body or may be a reaction to an irritated airway. A cough may only last 2 or 3 weeks (acute) or may last more than 8 weeks (chronic).  CAUSES Acute cough:  Viral or bacterial infections. Chronic cough:  Infections.  Allergies.  Asthma.  Post-nasal drip.  Smoking.  Heartburn or acid reflux.  Some  medicines.  Chronic lung problems (COPD).  Cancer. SYMPTOMS   Cough.  Fever.  Chest pain.  Increased breathing rate.  High-pitched whistling sound when breathing (wheezing).  Colored mucus that you cough up (sputum). TREATMENT   A bacterial cough may be treated with antibiotic medicine.  A viral cough must run its course and will not respond to antibiotics.  Your caregiver may recommend other treatments if you have a chronic cough. HOME CARE INSTRUCTIONS   Only take over-the-counter or prescription medicines for pain, discomfort, or fever as directed by your caregiver. Use cough suppressants only as directed by your caregiver.  Use a cold steam vaporizer or humidifier in your bedroom or home to help loosen secretions.  Sleep in a semi-upright position if your cough is worse at night.  Rest as needed.  Stop smoking if you smoke. SEEK IMMEDIATE MEDICAL CARE IF:   You have pus in your sputum.  Your cough starts to worsen.  You cannot control your cough with suppressants and are losing sleep.  You begin coughing up blood.  You have difficulty breathing.  You develop pain which is getting worse or is uncontrolled with medicine.  You have a fever. MAKE SURE YOU:   Understand these instructions.  Will watch your condition.  Will get help right away if you are not doing well or get worse. Document Released: 06/14/2011 Document Revised: 03/09/2012 Document Reviewed: 06/14/2011 [facility] Patient Information 2015 [place], [place]. This information is not intended to replace advice given to you by your health care provider. Make sure you discuss any questions you have with your health care provider.     I personally performed the services described in this documentation, which was scribed in my presence. The recorded information has been reviewed and considered, and addended by me as needed.

## 2014-11-27 NOTE — Patient Instructions (Signed)
Saline nasal spray atleast 4 times per day, over the counter mucinex or mucinex DM, or tessalon perles if needed for cough, drink plenty of fluids and rest as much as possible. If your cough is not starting to improve in next week - can fill prescription for antibiotic as discussed, but Return to the clinic or go to the nearest emergency room if any of your symptoms worsen or new symptoms occur. If coughing is keeping you awake at night - let me know and I can prescribe stronger medicine if needed.   Upper Respiratory Infection, Adult An upper respiratory infection (URI) is also sometimes known as the common cold. The upper respiratory tract includes the nose, sinuses, throat, trachea, and bronchi. Bronchi are the airways leading to the lungs. Most people improve within 1 week, but symptoms can last up to 2 weeks. A residual cough may last even longer.  CAUSES Many different viruses can infect the tissues lining the upper respiratory tract. The tissues become irritated and inflamed and often become very moist. Mucus production is also common. A cold is contagious. You can easily spread the virus to others by oral contact. This includes kissing, sharing a glass, coughing, or sneezing. Touching your mouth or nose and then touching a surface, which is then touched by another person, can also spread the virus. SYMPTOMS  Symptoms typically develop 1 to 3 days after you come in contact with a cold virus. Symptoms vary from person to person. They may include:  Runny nose.  Sneezing.  Nasal congestion.  Sinus irritation.  Sore throat.  Loss of voice (laryngitis).  Cough.  Fatigue.  Muscle aches.  Loss of appetite.  Headache.  Low-grade fever. DIAGNOSIS  You might diagnose your own cold based on familiar symptoms, since most people get a cold 2 to 3 times a year. Your caregiver can confirm this based on your exam. Most importantly, your caregiver can check that your symptoms are not due to  another disease such as strep throat, sinusitis, pneumonia, asthma, or epiglottitis. Blood tests, throat tests, and X-rays are not necessary to diagnose a common cold, but they may sometimes be helpful in excluding other more serious diseases. Your caregiver will decide if any further tests are required. RISKS AND COMPLICATIONS  You may be at risk for a more severe case of the common cold if you smoke cigarettes, have chronic heart disease (such as heart failure) or lung disease (such as asthma), or if you have a weakened immune system. The very young and very old are also at risk for more serious infections. Bacterial sinusitis, middle ear infections, and bacterial pneumonia can complicate the common cold. The common cold can worsen asthma and chronic obstructive pulmonary disease (COPD). Sometimes, these complications can require emergency medical care and may be life-threatening. PREVENTION  The best way to protect against getting a cold is to practice good hygiene. Avoid oral or hand contact with people with cold symptoms. Wash your hands often if contact occurs. There is no clear evidence that vitamin C, vitamin E, echinacea, or exercise reduces the chance of developing a cold. However, it is always recommended to get plenty of rest and practice good nutrition. TREATMENT  Treatment is directed at relieving symptoms. There is no cure. Antibiotics are not effective, because the infection is caused by a virus, not by bacteria. Treatment may include:  Increased fluid intake. Sports drinks offer valuable electrolytes, sugars, and fluids.  Breathing heated mist or steam (vaporizer or shower).  Eating  chicken soup or other clear broths, and maintaining good nutrition.  Getting plenty of rest.  Using gargles or lozenges for comfort.  Controlling fevers with ibuprofen or acetaminophen as directed by your caregiver.  Increasing usage of your inhaler if you have asthma. Zinc gel and zinc lozenges,  taken in the first 24 hours of the common cold, can shorten the duration and lessen the severity of symptoms. Pain medicines may help with fever, muscle aches, and throat pain. A variety of non-prescription medicines are available to treat congestion and runny nose. Your caregiver can make recommendations and may suggest nasal or lung inhalers for other symptoms.  HOME CARE INSTRUCTIONS   Only take over-the-counter or prescription medicines for pain, discomfort, or fever as directed by your caregiver.  Use a warm mist humidifier or inhale steam from a shower to increase air moisture. This may keep secretions moist and make it easier to breathe.  Drink enough water and fluids to keep your urine clear or pale yellow.  Rest as needed.  Return to work when your temperature has returned to normal or as your caregiver advises. You may need to stay home longer to avoid infecting others. You can also use a face mask and careful hand washing to prevent spread of the virus. SEEK MEDICAL CARE IF:   After the first few days, you feel you are getting worse rather than better.  You need your caregiver's advice about medicines to control symptoms.  You develop chills, worsening shortness of breath, or brown or red sputum. These may be signs of pneumonia.  You develop yellow or brown nasal discharge or pain in the face, especially when you bend forward. These may be signs of sinusitis.  You develop a fever, swollen neck glands, pain with swallowing, or white areas in the back of your throat. These may be signs of strep throat. SEEK IMMEDIATE MEDICAL CARE IF:   You have a fever.  You develop severe or persistent headache, ear pain, sinus pain, or chest pain.  You develop wheezing, a prolonged cough, cough up blood, or have a change in your usual mucus (if you have chronic lung disease).  You develop sore muscles or a stiff neck. Document Released: 06/11/2001 Document Revised: 03/09/2012 Document  Reviewed: 03/23/2014 [facility] Patient Information 2015 [place], [place]. This information is not intended to replace advice given to you by your health care provider. Make sure you discuss any questions you have with your health care provider.   Cough, Adult  A cough is a reflex that helps clear your throat and airways. It can help heal the body or may be a reaction to an irritated airway. A cough may only last 2 or 3 weeks (acute) or may last more than 8 weeks (chronic).  CAUSES Acute cough:  Viral or bacterial infections. Chronic cough:  Infections.  Allergies.  Asthma.  Post-nasal drip.  Smoking.  Heartburn or acid reflux.  Some medicines.  Chronic lung problems (COPD).  Cancer. SYMPTOMS   Cough.  Fever.  Chest pain.  Increased breathing rate.  High-pitched whistling sound when breathing (wheezing).  Colored mucus that you cough up (sputum). TREATMENT   A bacterial cough may be treated with antibiotic medicine.  A viral cough must run its course and will not respond to antibiotics.  Your caregiver may recommend other treatments if you have a chronic cough. HOME CARE INSTRUCTIONS   Only take over-the-counter or prescription medicines for pain, discomfort, or fever as directed by your caregiver. Use  cough suppressants only as directed by your caregiver.  Use a cold steam vaporizer or humidifier in your bedroom or home to help loosen secretions.  Sleep in a semi-upright position if your cough is worse at night.  Rest as needed.  Stop smoking if you smoke. SEEK IMMEDIATE MEDICAL CARE IF:   You have pus in your sputum.  Your cough starts to worsen.  You cannot control your cough with suppressants and are losing sleep.  You begin coughing up blood.  You have difficulty breathing.  You develop pain which is getting worse or is uncontrolled with medicine.  You have a fever. MAKE SURE YOU:   Understand these instructions.  Will watch your  condition.  Will get help right away if you are not doing well or get worse. Document Released: 06/14/2011 Document Revised: 03/09/2012 Document Reviewed: 06/14/2011 [facility] Patient Information 2015 [place], [place]. This information is not intended to replace advice given to you by your health care provider. Make sure you discuss any questions you have with your health care provider.

## 2015-02-21 ENCOUNTER — Other Ambulatory Visit (HOSPITAL_COMMUNITY): Payer: Self-pay | Admitting: Family Medicine

## 2015-02-21 DIAGNOSIS — Z1231 Encounter for screening mammogram for malignant neoplasm of breast: Secondary | ICD-10-CM

## 2015-03-21 ENCOUNTER — Ambulatory Visit (HOSPITAL_COMMUNITY)
Admission: RE | Admit: 2015-03-21 | Discharge: 2015-03-21 | Disposition: A | Discharge status: Home / Self Care | Payer: 59 | Source: Ambulatory Visit | Attending: Family Medicine | Admitting: Family Medicine

## 2015-03-21 ENCOUNTER — Ambulatory Visit (HOSPITAL_COMMUNITY): Payer: 59

## 2015-03-21 DIAGNOSIS — Z1231 Encounter for screening mammogram for malignant neoplasm of breast: Secondary | ICD-10-CM | POA: Insufficient documentation

## 2015-12-20 ENCOUNTER — Ambulatory Visit (INDEPENDENT_AMBULATORY_CARE_PROVIDER_SITE_OTHER): Payer: 59 | Admitting: Family Medicine

## 2015-12-20 VITALS — BP 122/76 | HR 92 | Temp 98.3°F | Resp 16 | Ht 62.0 in | Wt 245.0 lb

## 2015-12-20 DIAGNOSIS — H6692 Otitis media, unspecified, left ear: Secondary | ICD-10-CM | POA: Diagnosis not present

## 2015-12-20 DIAGNOSIS — J029 Acute pharyngitis, unspecified: Secondary | ICD-10-CM | POA: Diagnosis not present

## 2015-12-20 MED ORDER — HYDROCODONE-ACETAMINOPHEN 5-325 MG PO TABS: 1.0000 | ORAL_TABLET | Freq: Every evening | ORAL | Status: DC | PRN

## 2015-12-20 MED ORDER — AMOXICILLIN 875 MG PO TABS
875.0000 mg | ORAL_TABLET | Freq: Two times a day (BID) | ORAL | Status: DC
Start: 2015-12-20 — End: 2016-08-31

## 2015-12-20 NOTE — Progress Notes (Signed)
@UMFCLOGO @  By signing my name below, I, Raven Small, attest that this documentation has been prepared under the direction and in the presence of Robyn Haber, MD.  Electronically Signed: Thea Alken, ED Scribe. 12/20/2015. 8:45 PM.  Patient ID: Stacey Clark MRN: [id number], DOB: 05-29-1971, 44 y.o. Date of Encounter: 12/20/2015, 8:45 PM  Primary Physician: Myrlene Broker, MD  Chief Complaint:  Chief Complaint  Patient presents with  . Sore Throat    on left x 1 day   . Ear Pain    left, x 1 day   . Sinus Problem   HPI: 44 y.o. year old female with history below presents with sore throat and left ear pain that began yesterday. Pt states she woke up yesterday with otalgia, post nasal drip and eyes watering that worsened today with rhinorrhea and sore throat. She has tried nyquil without relief to symptoms. Pt is allergic to Sulfa drugs.  Her daughter is undergoing chemo for Hodgkin's and is concerned of her symptoms.  Pt works as a Emergency planning/management officer.     Past Medical History  Diagnosis Date  . Anxiety   . Obesity      Home Meds: Prior to Admission medications   Medication Sig Start Date End Date Taking? Authorizing Provider  ALPRAZolam Duanne Moron) 0.5 MG tablet Take 0.5 mg by mouth at bedtime as needed.   Yes Historical Provider, MD  buPROPion (WELLBUTRIN SR) 150 MG 12 hr tablet Take 150 mg by mouth 2 (two) times daily.   Yes Historical Provider, MD  escitalopram (LEXAPRO) 5 MG tablet Take 5 mg by mouth daily.   Yes Historical Provider, MD  esomeprazole (NEXIUM) 40 MG capsule Take 40 mg by mouth daily before breakfast.   Yes Historical Provider, MD  Liraglutide (VICTOZA [place]) Inject 1.2 mg into the skin.   Yes Historical Provider, MD  fluticasone (FLONASE) 50 MCG/ACT nasal spray Place 2 sprays into the nose daily. 02/24/12 02/23/13  Harden Mo, MD  omeprazole (PRILOSEC) 20 MG capsule Take 1 capsule (20 mg total) by mouth 2 (two) times daily. X 10 days 01/15/12 01/14/13  Melynda Ripple, MD    Allergies:  Allergies  Allergen Reactions  . Rifampin   . Sulfa Antibiotics     Social History   Social History  . Marital Status: Married    Spouse Name: N/A  . Number of Children: N/A  . Years of Education: N/A   Occupational History  . Not on file.   Social History Main Topics  . Smoking status: Former Research scientist (life sciences)  . Smokeless tobacco: Never Used  . Alcohol Use: No  . Drug Use: No  . Sexual Activity: Not on file   Other Topics Concern  . Not on file   Social History Narrative     Review of Systems: Constitutional: negative for chills, fever, night sweats, weight changes, or fatigue  HEENT: negative for vision changes, hearing loss, congestion, rhinorrhea, ST, epistaxis, or sinus pressure Cardiovascular: negative for chest pain or palpitations Respiratory: negative for hemoptysis, wheezing, shortness of breath, or cough Abdominal: negative for abdominal pain, nausea, vomiting, diarrhea, or constipation Dermatological: negative for rash Neurologic: negative for headache, dizziness, or syncope All other systems reviewed and are otherwise negative with the exception to those above and in the HPI.   Physical Exam: Blood pressure 122/76, pulse 92, temperature 98.3 F (36.8 C), temperature source Oral, resp. rate 16, height 5\' 2"  (1.575 m), weight 245 lb (111.131 kg), SpO2 98 %., Body mass  index is 44.8 kg/(m^2). General: Well developed, well nourished, in no acute distress. Head: Normocephalic, atraumatic, eyes without discharge, sclera non-icteric, nares are without discharge. Bilateral auditory canals clear, Distended ear drum on the left. Throat with erythema.  Neck: Supple. No thyromegaly. Full ROM. No lymphadenopathy. Msk:  Strength and tone normal for age. Extremities/Skin: Warm and dry. No clubbing or cyanosis. No edema. No rashes or suspicious lesions. Neuro: Alert and oriented X 3. Moves all extremities spontaneously. Gait is normal. CNII-XII  grossly in tact. Psych:  Responds to questions appropriately with a normal affect.    ASSESSMENT AND PLAN:  44 y.o. year old female with otitis and pharyngitis This chart was scribed in my presence and reviewed by me personally.    ICD-9-CM ICD-10-CM   1. Acute left otitis media, recurrence not specified, unspecified otitis media type 382.9 H66.92 amoxicillin (AMOXIL) 875 MG tablet     HYDROcodone-acetaminophen (NORCO) 5-325 MG tablet     Care order/instruction  2. Acute pharyngitis, unspecified etiology 462 J02.9 amoxicillin (AMOXIL) 875 MG tablet     HYDROcodone-acetaminophen (NORCO) 5-325 MG tablet     Care order/instruction      Signed, Robyn Haber, MD 12/20/2015 8:45 PM

## 2015-12-20 NOTE — Patient Instructions (Signed)

## 2015-12-29 ENCOUNTER — Ambulatory Visit (INDEPENDENT_AMBULATORY_CARE_PROVIDER_SITE_OTHER): Payer: 59 | Admitting: Family Medicine

## 2015-12-29 VITALS — BP 114/68 | HR 57 | Temp 97.4°F | Resp 16 | Ht 62.0 in | Wt 245.0 lb

## 2015-12-29 DIAGNOSIS — R062 Wheezing: Secondary | ICD-10-CM

## 2015-12-29 DIAGNOSIS — H6692 Otitis media, unspecified, left ear: Secondary | ICD-10-CM

## 2015-12-29 DIAGNOSIS — J019 Acute sinusitis, unspecified: Secondary | ICD-10-CM

## 2015-12-29 MED ORDER — CLARITHROMYCIN ER 500 MG PO TB24: ORAL_TABLET | ORAL | Status: DC

## 2015-12-29 MED ORDER — ALBUTEROL SULFATE HFA 108 (90 BASE) MCG/ACT IN AERS: 2.0000 | INHALATION_SPRAY | RESPIRATORY_TRACT | Status: DC | PRN

## 2015-12-29 NOTE — Progress Notes (Signed)
Patient ID: Stacey Clark, female    DOB: 20-Oct-1971  Age: 44 y.o. MRN: [id number]  Chief Complaint  Patient presents with  . Sinusitis    x 1 week  . Cough    x 1 week    Subjective:   44 year old lady who was here last week and treated with amoxicillin for an otitis. She has continued to have symptoms. Her ear still bothering her. She has had congestion. She has some hoarseness. She has a mild cough, nonproductive. She's not been febrile.  Current allergies, medications, problem list, past/family and social histories reviewed.  Objective:  BP 114/68 mmHg  Pulse 57  Temp(Src) 97.4 F (36.3 C) (Oral)  Resp 16  Ht 5\' 2"  (1.575 m)  Wt 245 lb (111.131 kg)  BMI 44.80 kg/m2  SpO2 93%  No major acute distress. Her TMs are fairly normal on the right. The left is not inflamed and red, but has some residual prominence of the tiny vessels on the drum. Her neck is supple without significant nodes. Throat looks clear. Chest clear to auscultation except for a little end-expiratory wheeze.  Assessment & Plan:   Assessment: 1. Acute sinusitis, recurrence not specified, unspecified location   2. Wheeze   3. Subacute otitis media of left ear, recurrence not specified, unspecified otitis media type       Plan: See instructions. If not doing better might need chest and/or sinus x-rays.    Meds ordered this encounter  Medications  . albuterol (PROVENTIL HFA;VENTOLIN HFA) 108 (90 Base) MCG/ACT inhaler    Sig: Inhale 2 puffs into the lungs every 4 (four) hours as needed for wheezing or shortness of breath (cough, shortness of breath or wheezing.).    Dispense:  1 Inhaler    Refill:  1  . clarithromycin (BIAXIN XL) 500 MG 24 hr tablet    Sig: Take one twice daily for infection    Dispense:  20 tablet    Refill:  0         Patient Instructions  Use the fluticasone (Flonase) nose spray that you have 2 sprays each nostril twice daily for about 3-4 days, then decrease to once daily  for a week or so  Use the albuterol inhaler 2 inhalations every 4-6 hours as needed for chest symptoms  Take the Biaxin (clarithromycin) one twice daily with food  You can also take a over-the-counter Claritin-D or Allegra-D if needed for too much congestion.  Return if not improving     Return if symptoms worsen or fail to improve.   Garner Dullea, MD 12/29/2015

## 2015-12-29 NOTE — Patient Instructions (Signed)
Use the fluticasone (Flonase) nose spray that you have 2 sprays each nostril twice daily for about 3-4 days, then decrease to once daily for a week or so  Use the albuterol inhaler 2 inhalations every 4-6 hours as needed for chest symptoms  Take the Biaxin (clarithromycin) one twice daily with food  You can also take a over-the-counter Claritin-D or Allegra-D if needed for too much congestion.  Return if not improving

## 2016-01-01 MED FILL — VENTOLIN HFA 90 MCG INHALER: 108 (90 BAS | 17 days supply | Qty: 18 | Fill #0

## 2016-01-09 MED FILL — VICTOZA 18 MG/3 ML INJECT P: 18 | 90 days supply | Qty: 18 | Fill #1

## 2016-01-09 MED FILL — UNIFINE PENTIPS 8MM 31G: 31G X 8 MM | 90 days supply | Qty: 100 | Fill #1

## 2016-01-15 MED FILL — OMEPRAZOLE DR 40 MG CAPSULE: 40 | 90 days supply | Qty: 90 | Fill #0

## 2016-01-23 MED FILL — BUPROPION SR 150 MG TABLET: 150 | 90 days supply | Qty: 180 | Fill #2

## 2016-01-24 MED FILL — ALPRAZolam 0.5 MG TABS: 0.5 | 90 days supply | Qty: 180 | Fill #0

## 2016-02-28 ENCOUNTER — Other Ambulatory Visit (HOSPITAL_BASED_OUTPATIENT_CLINIC_OR_DEPARTMENT_OTHER): Payer: Self-pay | Admitting: Family Medicine

## 2016-02-28 DIAGNOSIS — Z1231 Encounter for screening mammogram for malignant neoplasm of breast: Secondary | ICD-10-CM

## 2016-03-14 MED FILL — ESCITALOPRAM 10 MG TABLET: 10 | 90 days supply | Qty: 90 | Fill #2

## 2016-03-21 ENCOUNTER — Ambulatory Visit (HOSPITAL_BASED_OUTPATIENT_CLINIC_OR_DEPARTMENT_OTHER)
Admission: RE | Admit: 2016-03-21 | Discharge: 2016-03-21 | Disposition: A | Discharge status: Home / Self Care | Payer: 59 | Source: Ambulatory Visit | Attending: Family Medicine | Admitting: Family Medicine

## 2016-03-21 DIAGNOSIS — Z1231 Encounter for screening mammogram for malignant neoplasm of breast: Secondary | ICD-10-CM | POA: Insufficient documentation

## 2016-03-21 DIAGNOSIS — R928 Other abnormal and inconclusive findings on diagnostic imaging of breast: Secondary | ICD-10-CM | POA: Diagnosis not present

## 2016-03-22 ENCOUNTER — Ambulatory Visit (HOSPITAL_BASED_OUTPATIENT_CLINIC_OR_DEPARTMENT_OTHER): Payer: 59

## 2016-03-25 ENCOUNTER — Other Ambulatory Visit: Payer: Self-pay | Admitting: Family Medicine

## 2016-03-25 DIAGNOSIS — J111 Influenza due to unidentified influenza virus with other respiratory manifestations: Secondary | ICD-10-CM | POA: Diagnosis not present

## 2016-03-25 DIAGNOSIS — R928 Other abnormal and inconclusive findings on diagnostic imaging of breast: Secondary | ICD-10-CM

## 2016-04-03 ENCOUNTER — Ambulatory Visit
Admission: RE | Admit: 2016-04-03 | Discharge: 2016-04-03 | Disposition: A | Discharge status: Home / Self Care | Payer: 59 | Source: Ambulatory Visit | Attending: Family Medicine | Admitting: Family Medicine

## 2016-04-03 DIAGNOSIS — R928 Other abnormal and inconclusive findings on diagnostic imaging of breast: Secondary | ICD-10-CM

## 2016-04-03 DIAGNOSIS — N6001 Solitary cyst of right breast: Secondary | ICD-10-CM | POA: Diagnosis not present

## 2016-04-03 DIAGNOSIS — N63 Unspecified lump in breast: Secondary | ICD-10-CM | POA: Diagnosis not present

## 2016-04-23 MED FILL — VICTOZA 18 MG/3 ML INJECT P: 18 | 90 days supply | Qty: 18 | Fill #0

## 2016-05-20 MED FILL — UNIFINE PENTIPS 8MM 31G: 31G X 8 MM | 90 days supply | Qty: 100 | Fill #2

## 2016-06-04 MED FILL — ALPRAZolam 0.5 MG TABS: 0.5 | 90 days supply | Qty: 180 | Fill #1

## 2016-06-04 MED FILL — ESCITALOPRAM 10 MG TABLET: 10 | 90 days supply | Qty: 90 | Fill #3

## 2016-08-09 DIAGNOSIS — E349 Endocrine disorder, unspecified: Secondary | ICD-10-CM | POA: Diagnosis not present

## 2016-08-09 DIAGNOSIS — E1165 Type 2 diabetes mellitus with hyperglycemia: Secondary | ICD-10-CM | POA: Diagnosis not present

## 2016-08-09 DIAGNOSIS — Z01419 Encounter for gynecological examination (general) (routine) without abnormal findings: Secondary | ICD-10-CM | POA: Diagnosis not present

## 2016-08-09 MED FILL — VICTOZA 18 MG/3 ML INJECT P: 18 | 90 days supply | Qty: 18 | Fill #0

## 2016-08-19 MED FILL — ALPRAZolam 0.5 MG TABS: 0.5 | 90 days supply | Qty: 180 | Fill #0

## 2016-08-28 DIAGNOSIS — R05 Cough: Secondary | ICD-10-CM | POA: Diagnosis not present

## 2016-08-28 DIAGNOSIS — Z0001 Encounter for general adult medical examination with abnormal findings: Secondary | ICD-10-CM | POA: Diagnosis not present

## 2016-08-28 MED FILL — ESCITALOPRAM 20 MG TABLET: 20 | 90 days supply | Qty: 90 | Fill #0 | Status: TO

## 2016-08-28 MED FILL — BUPROPION SR 150 MG TABLET: 150 | 90 days supply | Qty: 180 | Fill #0 | Status: TO

## 2016-08-28 MED FILL — OMEPRAZOLE DR 40 MG CAPSULE: 40 | 90 days supply | Qty: 90 | Fill #0 | Status: TO

## 2016-08-31 ENCOUNTER — Emergency Department (HOSPITAL_BASED_OUTPATIENT_CLINIC_OR_DEPARTMENT_OTHER)
Admission: EM | Admit: 2016-08-31 | Discharge: 2016-08-31 | Disposition: A | Discharge status: Home / Self Care | Payer: 59 | Attending: Emergency Medicine | Admitting: Emergency Medicine

## 2016-08-31 ENCOUNTER — Encounter (HOSPITAL_BASED_OUTPATIENT_CLINIC_OR_DEPARTMENT_OTHER): Payer: Self-pay

## 2016-08-31 ENCOUNTER — Emergency Department (HOSPITAL_BASED_OUTPATIENT_CLINIC_OR_DEPARTMENT_OTHER): Payer: 59

## 2016-08-31 DIAGNOSIS — Z87891 Personal history of nicotine dependence: Secondary | ICD-10-CM | POA: Insufficient documentation

## 2016-08-31 DIAGNOSIS — M545 Low back pain: Secondary | ICD-10-CM | POA: Diagnosis not present

## 2016-08-31 LAB — URINALYSIS, ROUTINE W REFLEX MICROSCOPIC
Bilirubin Urine: NEGATIVE
GLUCOSE, UA: NEGATIVE mg/dL
Hgb urine dipstick: NEGATIVE
Ketones, ur: NEGATIVE mg/dL
LEUKOCYTES UA: NEGATIVE
Nitrite: NEGATIVE
PROTEIN: NEGATIVE mg/dL
SPECIFIC GRAVITY, URINE: 1.005 (ref 1.005–1.030)
pH: 6 (ref 5.0–8.0)

## 2016-08-31 LAB — PREGNANCY, URINE: PREG TEST UR: NEGATIVE

## 2016-08-31 MED ORDER — IBUPROFEN 400 MG PO TABS
600.0000 mg | ORAL_TABLET | Freq: Once | ORAL | Status: AC
  Administered 2016-08-31: 600 mg via ORAL
  Filled 2016-08-31: qty 1

## 2016-08-31 MED ORDER — IBUPROFEN 600 MG PO TABS: 600.0000 mg | ORAL_TABLET | Freq: Four times a day (QID) | ORAL | 0 refills | Status: DC | PRN

## 2016-08-31 MED ORDER — OXYCODONE-ACETAMINOPHEN 5-325 MG PO TABS
1.0000 | ORAL_TABLET | Freq: Once | ORAL | Status: AC
Start: 2016-08-31 — End: 2016-08-31
  Administered 2016-08-31: 1 via ORAL
  Filled 2016-08-31: qty 1

## 2016-08-31 MED ORDER — CYCLOBENZAPRINE HCL 10 MG PO TABS
10.0000 mg | ORAL_TABLET | Freq: Two times a day (BID) | ORAL | 0 refills | Status: DC | PRN
Start: 2016-08-31 — End: 2018-04-10

## 2016-08-31 NOTE — ED Notes (Signed)
Pt given d/c instructions as per chart. Rx x 2. Verbalizes understanding. No questions. 

## 2016-08-31 NOTE — Discharge Instructions (Signed)
Follow-up with your primary doctor, call to schedule follow-up appointment. Consider physical therapy.

## 2016-08-31 NOTE — ED Provider Notes (Signed)
[facility] Provider Note   CSN: [id number] Arrival date & time: 08/31/16  1600 By signing my name below, I, Dyke Brackett, attest that this documentation has been prepared under the direction and in the presence of Dorie Rank, MD . Electronically Signed: Dyke Brackett, Scribe. 08/31/2016. 5:17 PM.   History   Chief Complaint Chief Complaint  Patient presents with  . Back Pain    HPI Stacey Clark is a 45 y.o. female who presents to the Emergency Department complaining of sudden onset, moderate lower back pain onset this morning. Pt describes her pain as constant, sharp and shooting.  She also notes radiation to left leg and groin. No alleviating or modifying factors noted. She has taken ibuprofen with no relief. She denies any fall or trauma. Pt denies fever, chills, abdominal pain, nausea, vomiting, weakness, or numbness.  Patient also is concerned because she has a family member was diagnosed with bone cancer in her 57s. She presented initially with back pain.  The history is provided by the patient. No language interpreter was used.    Past Medical History:  Diagnosis Date  . Anxiety   . Obesity     Patient Active Problem List   Diagnosis Date Noted  . Obesity, unspecified 06/02/2014    Past Surgical History:  Procedure Laterality Date  . CESAREAN SECTION      OB History    No data available       Home Medications    Prior to Admission medications   Medication Sig Start Date End Date Taking? Authorizing Provider  albuterol (PROVENTIL HFA;VENTOLIN HFA) 108 (90 Base) MCG/ACT inhaler Inhale 2 puffs into the lungs every 4 (four) hours as needed for wheezing or shortness of breath (cough, shortness of breath or wheezing.). 12/29/15   Posey Boyer, MD  ALPRAZolam Duanne Moron) 0.5 MG tablet Take 0.5 mg by mouth at bedtime as needed.    Historical Provider, MD  buPROPion (WELLBUTRIN SR) 150 MG 12 hr tablet Take 150 mg by mouth 2 (two) times daily.    Historical  Provider, MD  cyclobenzaprine (FLEXERIL) 10 MG tablet Take 1 tablet (10 mg total) by mouth 2 (two) times daily as needed for muscle spasms. 08/31/16   Dorie Rank, MD  escitalopram (LEXAPRO) 5 MG tablet Take 5 mg by mouth daily.    Historical Provider, MD  ibuprofen (ADVIL,MOTRIN) 600 MG tablet Take 1 tablet (600 mg total) by mouth every 6 (six) hours as needed. 08/31/16   Dorie Rank, MD  Liraglutide (VICTOZA [place]) Inject 1.2 mg into the skin.    Historical Provider, MD  omeprazole (PRILOSEC) 20 MG capsule Take 1 capsule (20 mg total) by mouth 2 (two) times daily. X 10 days 01/15/12 01/14/13  Melynda Ripple, MD    Family History No family history on file.  Social History Social History  Substance Use Topics  . Smoking status: Former Research scientist (life sciences)  . Smokeless tobacco: Never Used  . Alcohol use No    Allergies   Hydrocodone; Rifampin; and Sulfa antibiotics  Review of Systems Review of Systems  Gastrointestinal: Negative for nausea and vomiting.  Musculoskeletal: Positive for back pain and myalgias.  Neurological: Negative for weakness and numbness.   Physical Exam Updated Vital Signs BP 123/57 (BP Location: Right Arm)   Pulse (!) 59   Temp 98.6 F (37 C) (Oral)   Resp 18   Ht 5\' 1"  (1.549 m)   Wt 114.8 kg   LMP 08/02/2016   SpO2 98%  BMI 47.80 kg/m   Physical Exam  Constitutional: She appears well-developed and well-nourished. No distress.  HENT:  Head: Normocephalic and atraumatic.  Right Ear: External ear normal.  Left Ear: External ear normal.  Nose: Nose normal.  Eyes: Conjunctivae and EOM are normal. Right eye exhibits no discharge. Left eye exhibits no discharge. No scleral icterus.  Neck: Neck supple. No tracheal deviation present.  Cardiovascular: Normal rate.   Pulmonary/Chest: Effort normal. No stridor. No respiratory distress.  Musculoskeletal: She exhibits no edema or tenderness.       Lumbar back: She exhibits decreased range of motion, pain and spasm. She exhibits  no swelling and no edema.  Neurological: She is alert. She is not disoriented. No sensory deficit. Cranial nerve deficit: no gross deficits. She exhibits normal muscle tone. Coordination normal.  5 out of 5 plantar flexion and dorsiflexion bilaterally, normal sensation to the lower extremities bilaterally  Skin: Skin is warm and dry. No rash noted. She is not diaphoretic. No erythema.  Psychiatric: She has a normal mood and affect. Her behavior is normal. Thought content normal.  Nursing note and vitals reviewed.   ED Treatments / Results  DIAGNOSTIC STUDIES:  Oxygen Saturation is 98% on RA, normal by my interpretation.    COORDINATION OF CARE:  5:14 PM Discussed treatment plan which includes DG lumbar spine with pt at bedside and pt agreed to plan.   Radiology Dg Lumbar Spine Complete  Result Date: 08/31/2016 CLINICAL DATA:  Acute low back/ lumbar spine pain today. Left leg pain and numbness. Initial encounter. EXAM: LUMBAR SPINE - COMPLETE 4+ VIEW COMPARISON:  None. FINDINGS: Mild compression of T12 and L1 identified-age indeterminate. Moderate degenerative disc disease at T12-L1 and L1-2 noted. There is no evidence of subluxation. No focal bony lesions or spondylolysis noted. IMPRESSION: Mild compression of T12 and L1 -age indeterminate. Moderate degenerative disc disease and spondylosis at T12-L1 and L1-2. Electronically Signed   By: Margarette Canada M.D.   On: 08/31/2016 18:26   Ct Lumbar Spine Wo Contrast  Result Date: 08/31/2016 CLINICAL DATA:  Sudden onset moderate low back pain this morning. Pain radiates to the left leg and groin. Thoracolumbar compression deformities on radiographs. EXAM: CT LUMBAR SPINE WITHOUT CONTRAST TECHNIQUE: Multidetector CT imaging of the lumbar spine was performed without intravenous contrast administration. Multiplanar CT image reconstructions were also generated. COMPARISON:  Lumbar spine radiographs earlier today FINDINGS: Lumbar segmentation is normal.  Minimal retrolisthesis of L1 on L2 measures 2 mm, likely degenerative. Mild disc space narrowing and endplate irregularity are present from T9-10 to L1-2 with multiple small Schmorl's node type deformities, most notable posteriorly at T11-12 and T12-L1. No frank compression fracture is evident. Anterior and lateral endplate spurring and mild degenerative endplate sclerosis are present at these levels in the lower thoracic and upper lumbar spine. The paraspinal soft tissues are unremarkable. There is a small partially calcified left central disc protrusion at T11-12 without gross stenosis. Leftward disc bulging and spurring are present at T12-L1, also without gross stenosis. Discs are normal in appearance from L2-3 to L5-S1. IMPRESSION: 1. No evidence of fracture. 2. Lower thoracic and upper lumbar disc degeneration with multiple small Schmorl's node deformities. No gross stenosis. Electronically Signed   By: Logan Bores M.D.   On: 08/31/2016 20:35    Procedures Procedures (including critical care time)  Medications Ordered in ED Medications  ibuprofen (ADVIL,MOTRIN) tablet 600 mg (600 mg Oral Given 08/31/16 1951)  oxyCODONE-acetaminophen (PERCOCET/ROXICET) 5-325 MG per tablet 1 tablet (  1 tablet Oral Given 08/31/16 1951)     Initial Impression / Assessment and Plan / ED Course  I have reviewed the triage vital signs and the nursing notes.  Pertinent labs & imaging results that were available during my care of the patient were reviewed by me and considered in my medical decision making (see chart for details).  Clinical Course  Comment By Time  Plain films reviewed.  Will add on CT scans Dorie Rank, MD 09/02 1903    Patient's CT scan does not show any compression fracture. I suspect her symptoms are related to degenerative disc disease. Plan on discharge home with prescription for NSAIDs and muscle relaxant. We discussed outpatient follow-up with her primary doctor. Consider physical  therapy  Final Clinical Impressions(s) / ED Diagnoses   Final diagnoses:  Left low back pain, with sciatica presence unspecified    New Prescriptions New Prescriptions   CYCLOBENZAPRINE (FLEXERIL) 10 MG TABLET    Take 1 tablet (10 mg total) by mouth 2 (two) times daily as needed for muscle spasms.   IBUPROFEN (ADVIL,MOTRIN) 600 MG TABLET    Take 1 tablet (600 mg total) by mouth every 6 (six) hours as needed.  I personally performed the services described in this documentation, which was scribed in my presence.  The recorded information has been reviewed and is accurate.    Dorie Rank, MD 08/31/16 2112

## 2016-08-31 NOTE — ED Triage Notes (Signed)
C/o lower back pain that radiates to left leg and groin-NAD-steady gait

## 2016-09-11 DIAGNOSIS — G4733 Obstructive sleep apnea (adult) (pediatric): Secondary | ICD-10-CM | POA: Diagnosis not present

## 2016-09-18 DIAGNOSIS — M9903 Segmental and somatic dysfunction of lumbar region: Secondary | ICD-10-CM | POA: Diagnosis not present

## 2016-09-18 DIAGNOSIS — M5417 Radiculopathy, lumbosacral region: Secondary | ICD-10-CM | POA: Diagnosis not present

## 2016-10-09 MED FILL — ULTICARE PEN NDL 8MM 31G: 31G X 8 MM | 90 days supply | Qty: 100 | Fill #0

## 2016-10-11 DIAGNOSIS — G4733 Obstructive sleep apnea (adult) (pediatric): Secondary | ICD-10-CM | POA: Diagnosis not present

## 2016-10-16 MED FILL — VICTOZA 18 MG/3 ML INJECT P: 18 | 30 days supply | Qty: 9 | Fill #0

## 2016-10-31 DIAGNOSIS — M5417 Radiculopathy, lumbosacral region: Secondary | ICD-10-CM | POA: Diagnosis not present

## 2016-10-31 DIAGNOSIS — M9903 Segmental and somatic dysfunction of lumbar region: Secondary | ICD-10-CM | POA: Diagnosis not present

## 2016-11-11 DIAGNOSIS — G4733 Obstructive sleep apnea (adult) (pediatric): Secondary | ICD-10-CM | POA: Diagnosis not present

## 2016-11-18 MED FILL — VICTOZA 18 MG/3 ML INJECT P: 18 | 30 days supply | Qty: 9 | Fill #1

## 2016-11-19 DIAGNOSIS — H5213 Myopia, bilateral: Secondary | ICD-10-CM | POA: Diagnosis not present

## 2016-11-19 DIAGNOSIS — E119 Type 2 diabetes mellitus without complications: Secondary | ICD-10-CM | POA: Diagnosis not present

## 2016-11-19 DIAGNOSIS — H52223 Regular astigmatism, bilateral: Secondary | ICD-10-CM | POA: Diagnosis not present

## 2016-11-19 DIAGNOSIS — Z7984 Long term (current) use of oral hypoglycemic drugs: Secondary | ICD-10-CM | POA: Diagnosis not present

## 2016-11-19 DIAGNOSIS — H524 Presbyopia: Secondary | ICD-10-CM | POA: Diagnosis not present

## 2016-12-11 DIAGNOSIS — G4733 Obstructive sleep apnea (adult) (pediatric): Secondary | ICD-10-CM | POA: Diagnosis not present

## 2016-12-13 DIAGNOSIS — G4733 Obstructive sleep apnea (adult) (pediatric): Secondary | ICD-10-CM | POA: Diagnosis not present

## 2016-12-25 MED FILL — ESCITALOPRAM 20 MG TABLET: 20 | 90 days supply | Qty: 90 | Fill #0

## 2016-12-25 MED FILL — BUPROPION SR 150 MG TABLET: 150 | 90 days supply | Qty: 180 | Fill #0

## 2016-12-25 MED FILL — ALPRAZolam 0.5 MG TABS: 0.5 | 90 days supply | Qty: 180 | Fill #0

## 2016-12-25 MED FILL — VICTOZA 18 MG/3 ML INJECT P: 18 | 30 days supply | Qty: 9 | Fill #2

## 2016-12-25 MED FILL — OMEPRAZOLE DR 40 MG CAPSULE: 40 | 90 days supply | Qty: 90 | Fill #0

## 2017-01-11 DIAGNOSIS — G4733 Obstructive sleep apnea (adult) (pediatric): Secondary | ICD-10-CM | POA: Diagnosis not present

## 2017-01-15 MED FILL — ULTICARE PEN NDL 8MM 31G: 31G X 8 MM | 90 days supply | Qty: 100 | Fill #1

## 2017-02-05 MED FILL — VICTOZA 18 MG/3 ML INJECT P: 18 | 30 days supply | Qty: 9 | Fill #3

## 2017-02-11 DIAGNOSIS — G4733 Obstructive sleep apnea (adult) (pediatric): Secondary | ICD-10-CM | POA: Diagnosis not present

## 2017-03-04 ENCOUNTER — Other Ambulatory Visit: Payer: Self-pay | Admitting: Family Medicine

## 2017-03-04 DIAGNOSIS — Z1231 Encounter for screening mammogram for malignant neoplasm of breast: Secondary | ICD-10-CM

## 2017-03-04 MED FILL — VICTOZA 18 MG/3 ML INJECT P: 18 | 30 days supply | Qty: 9 | Fill #0

## 2017-03-11 DIAGNOSIS — G4733 Obstructive sleep apnea (adult) (pediatric): Secondary | ICD-10-CM | POA: Diagnosis not present

## 2017-03-25 MED FILL — ESCITALOPRAM 20 MG TABLET: 20 | 90 days supply | Qty: 90 | Fill #1

## 2017-04-11 DIAGNOSIS — G4733 Obstructive sleep apnea (adult) (pediatric): Secondary | ICD-10-CM | POA: Diagnosis not present

## 2017-04-11 MED FILL — ULTICARE PEN NDL 8MM 31G: 31G X 8 MM | 90 days supply | Qty: 100 | Fill #2

## 2017-04-11 MED FILL — VICTOZA 18 MG/3 ML INJECT P: 18 | 60 days supply | Qty: 18 | Fill #1

## 2017-04-11 MED FILL — OMEPRAZOLE DR 40 MG CAPSULE: 40 | 90 days supply | Qty: 90 | Fill #1

## 2017-04-11 MED FILL — BUPROPION SR 150 MG TABLET: 150 | 90 days supply | Qty: 180 | Fill #1

## 2017-04-17 DIAGNOSIS — Z6841 Body Mass Index (BMI) 40.0 and over, adult: Secondary | ICD-10-CM | POA: Diagnosis not present

## 2017-04-17 DIAGNOSIS — R590 Localized enlarged lymph nodes: Secondary | ICD-10-CM | POA: Diagnosis not present

## 2017-04-17 MED FILL — AMOX-CLAV 875-125 MG TABLET: 875-125 | 14 days supply | Qty: 28 | Fill #0

## 2017-04-21 DIAGNOSIS — R590 Localized enlarged lymph nodes: Secondary | ICD-10-CM | POA: Diagnosis not present

## 2017-04-21 DIAGNOSIS — T360X5A Adverse effect of penicillins, initial encounter: Secondary | ICD-10-CM | POA: Diagnosis not present

## 2017-04-21 MED FILL — CLARITHROMYCIN 500 MG TAB: 500 | 10 days supply | Qty: 10 | Fill #0

## 2017-04-22 ENCOUNTER — Ambulatory Visit
Admission: RE | Admit: 2017-04-22 | Discharge: 2017-04-22 | Disposition: A | Discharge status: Home / Self Care | Payer: 59 | Source: Ambulatory Visit | Attending: Family Medicine | Admitting: Family Medicine

## 2017-04-22 DIAGNOSIS — Z1231 Encounter for screening mammogram for malignant neoplasm of breast: Secondary | ICD-10-CM | POA: Diagnosis not present

## 2017-04-23 ENCOUNTER — Other Ambulatory Visit: Payer: Self-pay | Admitting: Family Medicine

## 2017-04-23 DIAGNOSIS — R928 Other abnormal and inconclusive findings on diagnostic imaging of breast: Secondary | ICD-10-CM

## 2017-04-25 ENCOUNTER — Other Ambulatory Visit: Payer: Self-pay | Admitting: Family Medicine

## 2017-04-25 ENCOUNTER — Ambulatory Visit
Admission: RE | Admit: 2017-04-25 | Discharge: 2017-04-25 | Disposition: A | Discharge status: Home / Self Care | Payer: 59 | Source: Ambulatory Visit | Attending: Family Medicine | Admitting: Family Medicine

## 2017-04-25 DIAGNOSIS — R590 Localized enlarged lymph nodes: Secondary | ICD-10-CM | POA: Diagnosis not present

## 2017-04-25 DIAGNOSIS — R928 Other abnormal and inconclusive findings on diagnostic imaging of breast: Secondary | ICD-10-CM

## 2017-04-25 DIAGNOSIS — R599 Enlarged lymph nodes, unspecified: Secondary | ICD-10-CM

## 2017-05-01 DIAGNOSIS — R5383 Other fatigue: Secondary | ICD-10-CM | POA: Diagnosis not present

## 2017-05-01 DIAGNOSIS — Z6841 Body Mass Index (BMI) 40.0 and over, adult: Secondary | ICD-10-CM | POA: Diagnosis not present

## 2017-05-01 DIAGNOSIS — R590 Localized enlarged lymph nodes: Secondary | ICD-10-CM | POA: Diagnosis not present

## 2017-05-01 DIAGNOSIS — Z1389 Encounter for screening for other disorder: Secondary | ICD-10-CM | POA: Diagnosis not present

## 2017-05-06 MED FILL — DOXYCYCLINE HYCLATE 100 MG: 100 | 10 days supply | Qty: 20 | Fill #0

## 2017-05-11 DIAGNOSIS — G4733 Obstructive sleep apnea (adult) (pediatric): Secondary | ICD-10-CM | POA: Diagnosis not present

## 2017-06-18 DIAGNOSIS — F39 Unspecified mood [affective] disorder: Secondary | ICD-10-CM | POA: Diagnosis not present

## 2017-06-18 DIAGNOSIS — E1165 Type 2 diabetes mellitus with hyperglycemia: Secondary | ICD-10-CM | POA: Diagnosis not present

## 2017-06-18 DIAGNOSIS — K219 Gastro-esophageal reflux disease without esophagitis: Secondary | ICD-10-CM | POA: Diagnosis not present

## 2017-06-18 DIAGNOSIS — M79641 Pain in right hand: Secondary | ICD-10-CM | POA: Diagnosis not present

## 2017-06-18 MED FILL — VICTOZA 18 MG/3 ML INJECT P: 18 | 90 days supply | Qty: 27 | Fill #0

## 2017-06-18 MED FILL — ALPRAZolam 0.5 MG TABS: 0.5 | 90 days supply | Qty: 180 | Fill #0

## 2017-06-18 MED FILL — predniSONE 10 MG TABS: 10 | 6 days supply | Qty: 21 | Fill #0

## 2017-06-18 MED FILL — MELOXICAM 15 MG TABLET: 15 | 30 days supply | Qty: 30 | Fill #0

## 2017-06-18 MED FILL — ESCITALOPRAM 20 MG TABLET: 20 | 90 days supply | Qty: 90 | Fill #0

## 2017-06-24 ENCOUNTER — Ambulatory Visit
Admission: RE | Admit: 2017-06-24 | Discharge: 2017-06-24 | Disposition: A | Discharge status: Home / Self Care | Payer: 59 | Source: Ambulatory Visit | Attending: Family Medicine | Admitting: Family Medicine

## 2017-06-24 DIAGNOSIS — R599 Enlarged lymph nodes, unspecified: Secondary | ICD-10-CM

## 2017-06-24 DIAGNOSIS — R590 Localized enlarged lymph nodes: Secondary | ICD-10-CM | POA: Diagnosis not present

## 2017-07-22 DIAGNOSIS — L247 Irritant contact dermatitis due to plants, except food: Secondary | ICD-10-CM | POA: Diagnosis not present

## 2017-07-24 MED FILL — METHYLPREDNISOLONE 4 MG TAB: 4 | 6 days supply | Qty: 21 | Fill #0

## 2017-07-24 MED FILL — hydrOXYzine HCL 25 MG TABS: 25 | 10 days supply | Qty: 30 | Fill #0

## 2017-07-29 MED FILL — TRIAMCINOLONE 0.5% CREAM: 0.5 | 15 days supply | Qty: 15 | Fill #0

## 2017-08-03 ENCOUNTER — Ambulatory Visit (HOSPITAL_COMMUNITY)
Admission: EM | Admit: 2017-08-03 | Discharge: 2017-08-03 | Disposition: A | Discharge status: Home / Self Care | Payer: 59 | Attending: Internal Medicine | Admitting: Internal Medicine

## 2017-08-03 ENCOUNTER — Encounter (HOSPITAL_COMMUNITY): Payer: Self-pay | Admitting: Emergency Medicine

## 2017-08-03 DIAGNOSIS — R21 Rash and other nonspecific skin eruption: Secondary | ICD-10-CM | POA: Diagnosis not present

## 2017-08-03 DIAGNOSIS — L2489 Irritant contact dermatitis due to other agents: Secondary | ICD-10-CM | POA: Diagnosis not present

## 2017-08-03 MED ORDER — HYDROXYZINE HCL 25 MG PO TABS: 25.0000 mg | ORAL_TABLET | Freq: Four times a day (QID) | ORAL | 0 refills | Status: DC

## 2017-08-03 MED ORDER — METHYLPREDNISOLONE SODIUM SUCC 125 MG IJ SOLR
INTRAMUSCULAR | Status: AC
  Filled 2017-08-03: qty 2

## 2017-08-03 MED ORDER — PREDNISONE 10 MG (21) PO TBPK: ORAL_TABLET | Freq: Every day | ORAL | 0 refills | Status: DC

## 2017-08-03 MED ORDER — METHYLPREDNISOLONE SODIUM SUCC 125 MG IJ SOLR
125.0000 mg | Freq: Once | INTRAMUSCULAR | Status: AC
  Administered 2017-08-03: 125 mg via INTRAMUSCULAR

## 2017-08-03 NOTE — Discharge Instructions (Signed)
You were treated for contact dermatitis. You were given higher dosages of prednisone, which may change your blood glucose level. Contact your PCP tomorrow to discuss blood glucose. Take prednisone as directed. Hydroxyzine for itchiness as needed. Avoid hot showers, monitor your rashes, if not improving, please follow up with PCP for referrals needed to dermatology. If experiencing worsening of symptoms, trouble breathing, trouble swallowing, swelling of the throat, go to the ED for further evaluation.

## 2017-08-03 NOTE — ED Provider Notes (Signed)
CSN: [id number]     Arrival date & time 08/03/17  1201 History   None    Chief Complaint  Patient presents with  . Rash   (Consider location/radiation/quality/duration/timing/severity/associated sxs/prior Treatment) 46 year old female with history of anxiety, obesity, diabetes, comes in for 2 week history of rash. She was treated by her primary care for contact dermatitis, poison oak, with hydrocortisone cream, Kenalog injection once, and prednisone taper of 4 mg, 6-5-4-3-2-1. Patient has been taking Zyrtec and Zantac for itchiness. Has noticed that heat exacerbates the symptoms. Denies fever, chills, night sweats. Denies trouble breathing, trouble swallowing, swelling of the throat. Patient is diabetic, with well-controlled sugars, last A1c 6.0. She does not check her glucose daily due to well-controlled sugars.      Past Medical History:  Diagnosis Date  . Anxiety   . Obesity    Past Surgical History:  Procedure Laterality Date  . CESAREAN SECTION     History reviewed. No pertinent family history. Social History  Substance Use Topics  . Smoking status: Former Research scientist (life sciences)  . Smokeless tobacco: Never Used  . Alcohol use No   OB History    No data available     Review of Systems  Reason unable to perform ROS: See HPI as above.  Constitutional: Negative for chills, diaphoresis, fatigue and fever.  Skin: Positive for rash. Negative for wound.    Allergies  Hydrocodone; Rifampin; and Sulfa antibiotics  Home Medications   Prior to Admission medications   Medication Sig Start Date End Date Taking? Authorizing Provider  albuterol (PROVENTIL HFA;VENTOLIN HFA) 108 (90 Base) MCG/ACT inhaler Inhale 2 puffs into the lungs every 4 (four) hours as needed for wheezing or shortness of breath (cough, shortness of breath or wheezing.). 12/29/15   Posey Boyer, MD  ALPRAZolam Duanne Moron) 0.5 MG tablet Take 0.5 mg by mouth at bedtime as needed.    [provider]  buPROPion (WELLBUTRIN  SR) 150 MG 12 hr tablet Take 150 mg by mouth 2 (two) times daily.    [provider]  cyclobenzaprine (FLEXERIL) 10 MG tablet Take 1 tablet (10 mg total) by mouth 2 (two) times daily as needed for muscle spasms. 08/31/16   Dorie Rank, MD  escitalopram (LEXAPRO) 5 MG tablet Take 5 mg by mouth daily.    [provider]  hydrOXYzine (ATARAX/VISTARIL) 25 MG tablet Take 1 tablet (25 mg total) by mouth every 6 (six) hours. 08/03/17   Tasia Catchings, Jolly Carlini V, PA-C  ibuprofen (ADVIL,MOTRIN) 600 MG tablet Take 1 tablet (600 mg total) by mouth every 6 (six) hours as needed. 08/31/16   Dorie Rank, MD  Liraglutide (VICTOZA [place]) Inject 1.2 mg into the skin.    [provider]  omeprazole (PRILOSEC) 20 MG capsule Take 1 capsule (20 mg total) by mouth 2 (two) times daily. X 10 days 01/15/12 01/14/13  Melynda Ripple, MD  predniSONE (STERAPRED UNI-PAK 21 TAB) 10 MG (21) TBPK tablet Take by mouth daily. Take 6 tabs by mouth day 1, then 5 tabs, then 4 tabs, then 3 tabs, 2 tabs, then 1 tab for the last day 08/03/17   Ok Edwards, PA-C   Meds Ordered and Administered this Visit   Medications  methylPREDNISolone sodium succinate (SOLU-MEDROL) 125 mg/2 mL injection 125 mg (125 mg Intramuscular Given 08/03/17 1321)    BP 125/72 (BP Location: Left Arm)   Pulse 79   Temp 98.4 F (36.9 C) (Oral)   Resp 16   SpO2 97%  No data  found.   Physical Exam  Constitutional: She is oriented to person, place, and time. She appears well-developed and well-nourished. No distress.  HENT:  Head: Normocephalic and atraumatic.  Pulmonary/Chest: Effort normal and breath sounds normal.  Neurological: She is alert and oriented to person, place, and time.  Skin:  Urticaria rashes on her face, right neck, bilateral arms, abdomen. Scratch marks seen on the arms. No increased warmth, wounds, discharge seen.         Urgent Care Course     Procedures (including critical care time)  Labs Review Labs Reviewed - No data to  display  Imaging Review No results found.     MDM   1. Irritant contact dermatitis due to other agents    Discussed with patient history and exam consistent with contact dermatitis. Discussed possible continued exposure, to have patient monitor closely. Given patient had seen improvement with Kenalog injection, discussed with patient may need increased dosage of prednisone to fully resolve rash. Given patient with DM, discussed risks of elevated blood sugars. Patient has been well controlled on Victoza for many years, last a1c 6. Patient will like to attempt treatment, and will contact PCP for glucose monitoring. Solu-Medrol shot in the office injection in the office. Prednisone taper pack. Hydroxyzine for itchiness. No signs of bacterial infection today, patient to monitor for worsening of symptoms, increased warmth/redness, fever, follow-up with PCP for further evaluation. Patient to discuss with PCP for possible referral as needed if rash does not resolve. Patient to go to the emergency department if experiencing trouble breathing, trouble swallowing, swelling of the throat.   Ok Edwards, PA-C 08/03/17 1357

## 2017-08-03 NOTE — ED Triage Notes (Signed)
The patient presented to the [facility] with a complaint of a rash all over her body x 2 weeks. The patient stated that she had a kenalog injection and completed a 6 day course of oral steroids yesterday.

## 2017-08-25 DIAGNOSIS — L918 Other hypertrophic disorders of the skin: Secondary | ICD-10-CM | POA: Diagnosis not present

## 2017-08-25 DIAGNOSIS — D171 Benign lipomatous neoplasm of skin and subcutaneous tissue of trunk: Secondary | ICD-10-CM | POA: Diagnosis not present

## 2017-08-25 DIAGNOSIS — L820 Inflamed seborrheic keratosis: Secondary | ICD-10-CM | POA: Diagnosis not present

## 2017-08-29 DIAGNOSIS — F39 Unspecified mood [affective] disorder: Secondary | ICD-10-CM | POA: Diagnosis not present

## 2017-08-29 DIAGNOSIS — E1165 Type 2 diabetes mellitus with hyperglycemia: Secondary | ICD-10-CM | POA: Diagnosis not present

## 2017-08-29 DIAGNOSIS — Z0000 Encounter for general adult medical examination without abnormal findings: Secondary | ICD-10-CM | POA: Diagnosis not present

## 2017-08-29 MED FILL — FREESTYLE LITE TEST STRIP: 90 days supply | Qty: 100 | Fill #0 | Status: TO

## 2017-08-29 MED FILL — FREESTYLE LANCETS: 90 days supply | Qty: 100 | Fill #0 | Status: TO

## 2017-08-29 MED FILL — FREESTYLE LITE METER: 30 days supply | Qty: 1 | Fill #0

## 2017-09-02 MED FILL — PRAVASTATIN NA 20 MG TAB: 20 | 90 days supply | Qty: 90 | Fill #0

## 2017-09-15 MED FILL — TRULICITY 0.75 MG/0.5 ML PE: 0.75 | 28 days supply | Qty: 2 | Fill #0

## 2017-09-25 DIAGNOSIS — D171 Benign lipomatous neoplasm of skin and subcutaneous tissue of trunk: Secondary | ICD-10-CM | POA: Diagnosis not present

## 2017-09-30 ENCOUNTER — Other Ambulatory Visit: Payer: Self-pay | Admitting: General Surgery

## 2017-09-30 DIAGNOSIS — D171 Benign lipomatous neoplasm of skin and subcutaneous tissue of trunk: Secondary | ICD-10-CM

## 2017-10-03 ENCOUNTER — Ambulatory Visit
Admission: RE | Admit: 2017-10-03 | Discharge: 2017-10-03 | Disposition: A | Discharge status: Home / Self Care | Payer: 59 | Source: Ambulatory Visit | Attending: General Surgery | Admitting: General Surgery

## 2017-10-03 DIAGNOSIS — R221 Localized swelling, mass and lump, neck: Secondary | ICD-10-CM | POA: Diagnosis not present

## 2017-10-03 DIAGNOSIS — D171 Benign lipomatous neoplasm of skin and subcutaneous tissue of trunk: Secondary | ICD-10-CM

## 2017-10-09 ENCOUNTER — Other Ambulatory Visit: Payer: Self-pay | Admitting: General Surgery

## 2017-10-09 DIAGNOSIS — D171 Benign lipomatous neoplasm of skin and subcutaneous tissue of trunk: Secondary | ICD-10-CM

## 2017-10-17 ENCOUNTER — Ambulatory Visit
Admission: RE | Admit: 2017-10-17 | Discharge: 2017-10-17 | Disposition: A | Discharge status: Home / Self Care | Payer: 59 | Source: Ambulatory Visit | Attending: General Surgery | Admitting: General Surgery

## 2017-10-17 DIAGNOSIS — D171 Benign lipomatous neoplasm of skin and subcutaneous tissue of trunk: Secondary | ICD-10-CM

## 2017-10-17 DIAGNOSIS — J101 Influenza due to other identified influenza virus with other respiratory manifestations: Secondary | ICD-10-CM | POA: Diagnosis not present

## 2017-10-17 DIAGNOSIS — R918 Other nonspecific abnormal finding of lung field: Secondary | ICD-10-CM | POA: Diagnosis not present

## 2017-10-17 HISTORY — DX: Type 2 diabetes mellitus without complications: E11.9

## 2017-10-17 HISTORY — DX: Unspecified asthma, uncomplicated: J45.909

## 2017-10-17 MED ORDER — IOPAMIDOL (ISOVUE-300) INJECTION 61%
75.0000 mL | Freq: Once | INTRAVENOUS | Status: AC | PRN
  Administered 2017-10-17: 75 mL via INTRAVENOUS

## 2017-10-23 MED FILL — levoFLOXacin 750 MG TABS: 750 | 10 days supply | Qty: 10 | Fill #0

## 2017-10-29 MED FILL — ALPRAZolam 0.5 MG TABS: 0.5 | 90 days supply | Qty: 180 | Fill #0

## 2017-10-29 MED FILL — TRULICITY 0.75 MG/0.5 ML PE: 0.75 | 28 days supply | Qty: 2 | Fill #1

## 2017-10-29 MED FILL — ESCITALOPRAM 20 MG TABLET: 20 | 90 days supply | Qty: 90 | Fill #1

## 2017-10-29 MED FILL — BUPROPION SR 150 MG TABLET: 150 | 90 days supply | Qty: 180 | Fill #0

## 2017-10-31 DIAGNOSIS — J189 Pneumonia, unspecified organism: Secondary | ICD-10-CM | POA: Diagnosis not present

## 2017-10-31 DIAGNOSIS — J181 Lobar pneumonia, unspecified organism: Secondary | ICD-10-CM | POA: Diagnosis not present

## 2017-10-31 MED FILL — levoFLOXacin 750 MG TABS: 750 | 4 days supply | Qty: 4 | Fill #0

## 2017-11-19 DIAGNOSIS — R5381 Other malaise: Secondary | ICD-10-CM | POA: Diagnosis not present

## 2017-11-19 DIAGNOSIS — J4531 Mild persistent asthma with (acute) exacerbation: Secondary | ICD-10-CM | POA: Diagnosis not present

## 2017-11-19 DIAGNOSIS — J189 Pneumonia, unspecified organism: Secondary | ICD-10-CM | POA: Diagnosis not present

## 2017-11-19 DIAGNOSIS — R5383 Other fatigue: Secondary | ICD-10-CM | POA: Diagnosis not present

## 2017-11-19 DIAGNOSIS — J181 Lobar pneumonia, unspecified organism: Secondary | ICD-10-CM | POA: Diagnosis not present

## 2017-11-19 MED FILL — SYMBICORT 160-4.5 MCG INH: 160-4.5 | 30 days supply | Qty: 10 | Fill #0

## 2017-11-26 DIAGNOSIS — E119 Type 2 diabetes mellitus without complications: Secondary | ICD-10-CM | POA: Diagnosis not present

## 2017-12-03 MED FILL — TRULICITY 0.75 MG/0.5 ML PE: 0.75 | 28 days supply | Qty: 2 | Fill #2

## 2017-12-04 MED FILL — IBUPROFEN 600 MG TABLET: 600 | 4 days supply | Qty: 16 | Fill #0

## 2017-12-04 MED FILL — TRAMADOL-ACETAMINOPHN 37.5-: 37.5-325 | 2 days supply | Qty: 10 | Fill #0

## 2017-12-04 MED FILL — CLINDAMYCIN HCL 150 MG CAPS: 150 | 6 days supply | Qty: 25 | Fill #0

## 2017-12-31 MED FILL — TRULICITY 0.75 MG/0.5 ML PE: 0.75 | 28 days supply | Qty: 2 | Fill #3

## 2018-01-26 MED FILL — TRULICITY 0.75 MG/0.5 ML PE: 0.75 | 28 days supply | Qty: 2 | Fill #4

## 2018-01-26 MED FILL — ALPRAZolam 0.5 MG TABS: 0.5 | 90 days supply | Qty: 180 | Fill #1

## 2018-01-26 MED FILL — BUPROPION SR 150 MG TABLET: 150 | 90 days supply | Qty: 180 | Fill #1

## 2018-01-29 DIAGNOSIS — G4700 Insomnia, unspecified: Secondary | ICD-10-CM | POA: Diagnosis not present

## 2018-01-29 DIAGNOSIS — E1165 Type 2 diabetes mellitus with hyperglycemia: Secondary | ICD-10-CM | POA: Diagnosis not present

## 2018-01-29 DIAGNOSIS — F39 Unspecified mood [affective] disorder: Secondary | ICD-10-CM | POA: Diagnosis not present

## 2018-01-29 DIAGNOSIS — E349 Endocrine disorder, unspecified: Secondary | ICD-10-CM | POA: Diagnosis not present

## 2018-01-29 MED FILL — VIIBRYD 20 MG TABLET: 20 | 30 days supply | Qty: 30 | Fill #0

## 2018-01-29 MED FILL — FREESTYLE LITE METER: 30 days supply | Qty: 1 | Fill #0

## 2018-02-18 DIAGNOSIS — E2740 Unspecified adrenocortical insufficiency: Secondary | ICD-10-CM | POA: Diagnosis not present

## 2018-02-25 MED FILL — TRULICITY 0.75 MG/0.5 ML PE: 0.75 | 28 days supply | Qty: 2 | Fill #5

## 2018-03-09 MED FILL — ESCITALOPRAM 20 MG TABLET: 20 | 90 days supply | Qty: 90 | Fill #2

## 2018-03-12 DIAGNOSIS — B370 Candidal stomatitis: Secondary | ICD-10-CM | POA: Diagnosis not present

## 2018-03-12 MED FILL — CLOTRIMAZOLE 10 MG TROCHE: 10 | 10 days supply | Qty: 50 | Fill #0

## 2018-03-30 ENCOUNTER — Other Ambulatory Visit: Payer: Self-pay | Admitting: Family Medicine

## 2018-03-30 DIAGNOSIS — Z139 Encounter for screening, unspecified: Secondary | ICD-10-CM

## 2018-03-31 MED FILL — TRULICITY 0.75 MG/0.5 ML PE: 0.75 | 28 days supply | Qty: 2 | Fill #6

## 2018-04-02 ENCOUNTER — Ambulatory Visit: Payer: 59 | Admitting: Endocrinology

## 2018-04-02 MED FILL — MAGIC MW (BENADRYL/KAOPEC): 3 days supply | Qty: 240 | Fill #0

## 2018-04-08 DIAGNOSIS — K120 Recurrent oral aphthae: Secondary | ICD-10-CM | POA: Diagnosis not present

## 2018-04-08 DIAGNOSIS — K141 Geographic tongue: Secondary | ICD-10-CM | POA: Diagnosis not present

## 2018-04-08 DIAGNOSIS — E1165 Type 2 diabetes mellitus with hyperglycemia: Secondary | ICD-10-CM | POA: Diagnosis not present

## 2018-04-08 DIAGNOSIS — K148 Other diseases of tongue: Secondary | ICD-10-CM | POA: Diagnosis not present

## 2018-04-08 MED FILL — metFORMIN HCL 500 MG TABS: 500 | 90 days supply | Qty: 180 | Fill #0

## 2018-04-08 MED FILL — TRIAMCINOLONE 0.1% PASTE: 0.1 | 10 days supply | Qty: 5 | Fill #0

## 2018-04-10 ENCOUNTER — Encounter: Payer: Self-pay | Admitting: Endocrinology

## 2018-04-10 ENCOUNTER — Ambulatory Visit: Payer: 59 | Admitting: Endocrinology

## 2018-04-10 VITALS — BP 126/70 | HR 76 | Ht 61.0 in | Wt 278.4 lb

## 2018-04-10 DIAGNOSIS — N951 Menopausal and female climacteric states: Secondary | ICD-10-CM | POA: Diagnosis not present

## 2018-04-10 DIAGNOSIS — R635 Abnormal weight gain: Secondary | ICD-10-CM | POA: Diagnosis not present

## 2018-04-10 DIAGNOSIS — R5383 Other fatigue: Secondary | ICD-10-CM

## 2018-04-10 NOTE — Progress Notes (Addendum)
Patient ID: Stacey Clark, female   DOB: Feb 18, 1971, 47 y.o.   MRN: [id number]          Referring physician: Janace Litten  Chief complaint: Weight gain  History of Present Illness:   She started having weight gain in August 2018 She has gained 30 pounds although this is leveled off now About the same time she started having much more fatigue; she gets tired very easily She feels like if she is working 8 hours she feels like she has worked much longer However she does feel tired when she wakes up also  Thyroid levels including free T4 were normal from PCP evaluation recently  In August 2018 she was also having problems with more difficulty sleeping, irritability was given Lexapro in addition to her Wellbutrin for depression  She also has had hot flashes for several months sweating although not waking up at night frequently with these Over the last couple of years she has had reduced frequency and duration of menstrual cycles, did have a cycle each month the last 2 months but relatively light  She had low normal estradiol level of 29 with an [place] of 5.3 in 1/19  Her primary care physician was also testing her for cortisol which was low at 1.8 However she has had treatment with steroids including Kenalog periodically including in last December, ACTH level was also low    Past Medical History:  Diagnosis Date  . Anxiety   . Asthma   . Diabetes mellitus without complication ([place])   . Obesity     Past Surgical History:  Procedure Laterality Date  . CESAREAN SECTION      Family History  Problem Relation Age of Onset  . Thyroid disease Neg Hx     Social History:  reports that she has quit smoking. She has never used smokeless tobacco. She reports that she does not drink alcohol or use drugs.  Allergies:  Allergies  Allergen Reactions  . Cephalexin Swelling  . Hydrocodone Nausea And Vomiting  . Rifampin Nausea And Vomiting, Rash and Other (See Comments)    Nausea,  Vomiting  . Sulfa Antibiotics Hives  . Augmentin [Amoxicillin-Pot Clavulanate] Nausea And Vomiting  . Penicillins Swelling    Allergies as of 04/10/2018      Reactions   Cephalexin Swelling   Hydrocodone Nausea And Vomiting   Rifampin Nausea And Vomiting, Rash, Other (See Comments)   Nausea, Vomiting   Sulfa Antibiotics Hives   Augmentin [amoxicillin-pot Clavulanate] Nausea And Vomiting   Penicillins Swelling      Medication List        Accurate as of 04/10/18 11:59 PM. Always use your most recent med list.          ALPRAZolam 0.5 MG tablet Commonly known as:  XANAX Take 0.5 mg by mouth at bedtime as needed.   buPROPion 150 MG 12 hr tablet Commonly known as:  WELLBUTRIN SR Take 150 mg by mouth 2 (two) times daily.   escitalopram 5 MG tablet Commonly known as:  LEXAPRO Take 5 mg by mouth daily.   omeprazole 20 MG capsule Commonly known as:  PRILOSEC Take 1 capsule (20 mg total) by mouth 2 (two) times daily. X 10 days   TRULICITY 1.5 FO/2.7XA Sopn Generic drug:  Dulaglutide Inject 1.5 mg into the skin once a week.       LABS:  No visits with results within 1 Week(s) from this visit.  Latest known visit with results is:  Admission on 08/31/2016, Discharged on 08/31/2016  Component Date Value Ref Range Status  . Color, Urine 08/31/2016 YELLOW  YELLOW Final  . APPearance 08/31/2016 CLEAR  CLEAR Final  . Specific Gravity, Urine 08/31/2016 1.005  1.005 - 1.030 Final  . pH 08/31/2016 6.0  5.0 - 8.0 Final  . Glucose, UA 08/31/2016 NEGATIVE  NEGATIVE mg/dL Final  . Hgb urine dipstick 08/31/2016 NEGATIVE  NEGATIVE Final  . Bilirubin Urine 08/31/2016 NEGATIVE  NEGATIVE Final  . Ketones, ur 08/31/2016 NEGATIVE  NEGATIVE mg/dL Final  . Protein, ur 08/31/2016 NEGATIVE  NEGATIVE mg/dL Final  . Nitrite 08/31/2016 NEGATIVE  NEGATIVE Final  . Leukocytes, UA 08/31/2016 NEGATIVE  NEGATIVE Final   MICROSCOPIC NOT DONE ON URINES WITH NEGATIVE PROTEIN, BLOOD, LEUKOCYTES,  NITRITE, OR GLUCOSE <1000 mg/dL.  . Preg Test, Ur 08/31/2016 NEGATIVE  NEGATIVE Final   Comment:        THE SENSITIVITY OF THIS METHODOLOGY IS >20 mIU/mL.         Review of Systems  Constitutional: Positive for weight gain.  HENT: Negative for headaches.   Eyes: Negative for blurred vision.  Respiratory: Negative for shortness of breath.   Cardiovascular: Negative for palpitations and leg swelling.  Gastrointestinal: Positive for diarrhea.       He has increased postprandial bowel movements  Endocrine: Positive for menstrual changes and fatigue.        She has had diabetes since [age], taking Trulicity, last W4R 6.8 done in 1/19  Musculoskeletal: Negative for joint pain.  Skin:       No excessive bruising  Neurological: Negative for weakness.  Psychiatric/Behavioral: Positive for insomnia.Negative for depressed mood.       On treatment for 26 yrs, Lexapro added in 2018     PHYSICAL EXAM:  BP 126/70 (BP Location: Left Arm, Patient Position: Sitting, Cuff Size: Large)   Pulse 76   Ht 5\' 1"  (1.549 m)   Wt 278 lb 6.4 oz (126.3 kg)   SpO2 96%   BMI 52.60 kg/m   GENERAL: She has generalized obesity without cushingoid features  No pallor, clubbing or edema.    Skin:  no rash or pigmentation.  No tenting of the skin, ecchymoses, purplish striae.  EYES:  Externally normal.  ENT: Oral mucosa and tongue normal.  THYROID:  Not palpable No cervical lymphadenopathy  HEART:  Normal  S1 and S2; no murmur or click.  CHEST:  Normal shape.  Lungs: Vescicular breath sounds heard equally.  No crepitations/ wheeze.  ABDOMEN:  No distention.  Liver and spleen not palpable.   No other mass or tenderness.  NEUROLOGICAL: .Reflexes are bilaterally normal at biceps.  JOINTS:  Normal.   ASSESSMENT:    Weight gain: This is likely to be from her going through depression, menopause, lack of exercise and exposure to recurrent long-acting steroids in the last few months.  Her weight  has leveled off.  However she is still having difficulty with weight loss and does need to be in a weight loss program especially since she is not able to do this on her own, not exercising currently and needs supervision and possible pharmacological treatment  Fatigue: This is not related to any endocrine disease as her thyroid levels are quite normal  Low cortisol levels measured on her labs: This is related to various long-acting steroids including Kenalog she had prior to testing.  She has no evidence of Cushing's disease clinically  Perimenopausal status with oligomenorrhea, hot flashes.  Although her  FSH is currently normal her estradiol level is low normal.  No other evidence of hypopituitarism  Diabetes, mild with last A1c 6.8, currently being managed on TRULICITY   PLAN:   Explained to the patient that she does not have any endocrine abnormality explaining her symptoms   Although she is a good candidate for HRT for symptomatic relief of her perimenopausal symptoms she is not wanting to do this now She agrees to try OTC Black cohosh for symptomatic relief May need to have follow-up if [facility] level done when she stops having menstrual cycles and if this is still not high with consider pituitary evaluation  For her weight loss would recommend that she continue taking Trulicity and not switch to metformin since she will start gaining weight with the change.  May also do better with other weight loss medications  She agrees to be referred to the weight loss management at Dr. Migdalia Dk office since she will need ongoing treatment and monitoring  She needs to continue with her PCP for management of her depression, insomnia and other issues  Follow-up here as needed  Consultation note sent to the referring physician  Elayne Snare 04/17/2018, 11:30 AM

## 2018-04-10 NOTE — Patient Instructions (Signed)
Black Cohosh

## 2018-04-13 ENCOUNTER — Ambulatory Visit: Payer: 59 | Admitting: Endocrinology

## 2018-04-17 ENCOUNTER — Encounter: Payer: Self-pay | Admitting: Endocrinology

## 2018-04-21 ENCOUNTER — Ambulatory Visit: Payer: 59 | Admitting: Endocrinology

## 2018-04-24 ENCOUNTER — Ambulatory Visit
Admission: RE | Admit: 2018-04-24 | Discharge: 2018-04-24 | Disposition: A | Discharge status: Home / Self Care | Payer: 59 | Source: Ambulatory Visit | Attending: Family Medicine | Admitting: Family Medicine

## 2018-04-24 DIAGNOSIS — Z139 Encounter for screening, unspecified: Secondary | ICD-10-CM

## 2018-04-24 DIAGNOSIS — Z1231 Encounter for screening mammogram for malignant neoplasm of breast: Secondary | ICD-10-CM | POA: Diagnosis not present

## 2018-04-30 ENCOUNTER — Encounter (INDEPENDENT_AMBULATORY_CARE_PROVIDER_SITE_OTHER): Payer: Self-pay

## 2018-05-01 MED FILL — glipiZIDE 5 MG TABS: 5 | 90 days supply | Qty: 180 | Fill #0

## 2018-05-01 MED FILL — TRULICITY 0.75 MG/0.5 ML PE: 0.75 | 28 days supply | Qty: 2 | Fill #7 | Status: TO

## 2018-05-04 DIAGNOSIS — L859 Epidermal thickening, unspecified: Secondary | ICD-10-CM | POA: Diagnosis not present

## 2018-05-04 DIAGNOSIS — R0982 Postnasal drip: Secondary | ICD-10-CM | POA: Diagnosis not present

## 2018-05-04 DIAGNOSIS — Z1281 Encounter for screening for malignant neoplasm of oral cavity: Secondary | ICD-10-CM | POA: Diagnosis not present

## 2018-05-04 DIAGNOSIS — K148 Other diseases of tongue: Secondary | ICD-10-CM | POA: Diagnosis not present

## 2018-05-04 DIAGNOSIS — Z87891 Personal history of nicotine dependence: Secondary | ICD-10-CM | POA: Diagnosis not present

## 2018-05-04 DIAGNOSIS — K1329 Other disturbances of oral epithelium, including tongue: Secondary | ICD-10-CM | POA: Diagnosis not present

## 2018-05-04 DIAGNOSIS — Z7289 Other problems related to lifestyle: Secondary | ICD-10-CM | POA: Diagnosis not present

## 2018-05-07 ENCOUNTER — Encounter (INDEPENDENT_AMBULATORY_CARE_PROVIDER_SITE_OTHER): Payer: Self-pay

## 2018-05-14 ENCOUNTER — Encounter (INDEPENDENT_AMBULATORY_CARE_PROVIDER_SITE_OTHER): Payer: Self-pay | Admitting: Family Medicine

## 2018-05-14 ENCOUNTER — Ambulatory Visit (INDEPENDENT_AMBULATORY_CARE_PROVIDER_SITE_OTHER): Payer: 59 | Admitting: Family Medicine

## 2018-05-14 VITALS — BP 115/74 | HR 61 | Temp 98.0°F | Ht 61.0 in | Wt 277.0 lb

## 2018-05-14 DIAGNOSIS — Z6841 Body Mass Index (BMI) 40.0 and over, adult: Secondary | ICD-10-CM | POA: Diagnosis not present

## 2018-05-14 DIAGNOSIS — E7849 Other hyperlipidemia: Secondary | ICD-10-CM | POA: Diagnosis not present

## 2018-05-14 DIAGNOSIS — Z9189 Other specified personal risk factors, not elsewhere classified: Secondary | ICD-10-CM | POA: Diagnosis not present

## 2018-05-14 DIAGNOSIS — E119 Type 2 diabetes mellitus without complications: Secondary | ICD-10-CM | POA: Diagnosis not present

## 2018-05-14 DIAGNOSIS — G4733 Obstructive sleep apnea (adult) (pediatric): Secondary | ICD-10-CM | POA: Diagnosis not present

## 2018-05-14 DIAGNOSIS — R5383 Other fatigue: Secondary | ICD-10-CM

## 2018-05-14 DIAGNOSIS — R0602 Shortness of breath: Secondary | ICD-10-CM | POA: Diagnosis not present

## 2018-05-14 DIAGNOSIS — Z1331 Encounter for screening for depression: Secondary | ICD-10-CM | POA: Diagnosis not present

## 2018-05-14 DIAGNOSIS — Z0289 Encounter for other administrative examinations: Secondary | ICD-10-CM

## 2018-05-14 MED FILL — BUPROPION SR 150 MG TABLET: 150 | 90 days supply | Qty: 180 | Fill #2

## 2018-05-15 LAB — FOLATE: Folate: 7.7 ng/mL (ref >3.0)

## 2018-05-15 LAB — COMPREHENSIVE METABOLIC PANEL
ALBUMIN: 3.9 g/dL (ref 3.5–5.5)
ALT: 15 IU/L (ref 0–32)
AST: 19 IU/L (ref 0–40)
Albumin/Globulin Ratio: 1.4 (ref 1.2–2.2)
Alkaline Phosphatase: 93 IU/L (ref 39–117)
BUN/Creatinine Ratio: 12 (ref 9–23)
BUN: 9 mg/dL (ref 6–24)
Bilirubin Total: <0.2 mg/dL (ref 0.0–1.2)
CALCIUM: 9.3 mg/dL (ref 8.7–10.2)
CHLORIDE: 101 mmol/L (ref 96–106)
CO2: 22 mmol/L (ref 20–29)
CREATININE: 0.75 mg/dL (ref 0.57–1.00)
GFR, EST AFRICAN AMERICAN: 109 mL/min/{1.73_m2} (ref >59)
GFR, EST NON AFRICAN AMERICAN: 95 mL/min/{1.73_m2} (ref >59)
GLUCOSE: 85 mg/dL (ref 65–99)
Globulin, Total: 2.7 g/dL (ref 1.5–4.5)
Potassium: 4.4 mmol/L (ref 3.5–5.2)
Sodium: 137 mmol/L (ref 134–144)
TOTAL PROTEIN: 6.6 g/dL (ref 6.0–8.5)

## 2018-05-15 LAB — CBC WITH DIFFERENTIAL
Basophils Absolute: 0.1 10*3/uL (ref 0.0–0.2)
Basos: 1 %
EOS (ABSOLUTE): 0.2 10*3/uL (ref 0.0–0.4)
Eos: 2 %
HEMOGLOBIN: 13.2 g/dL (ref 11.1–15.9)
Hematocrit: 39.8 % (ref 34.0–46.6)
IMMATURE GRANS (ABS): 0 10*3/uL (ref 0.0–0.1)
Immature Granulocytes: 0 %
LYMPHS: 23 %
Lymphocytes Absolute: 2.3 10*3/uL (ref 0.7–3.1)
MCH: 28.1 pg (ref 26.6–33.0)
MCHC: 33.2 g/dL (ref 31.5–35.7)
MCV: 85 fL (ref 79–97)
MONOCYTES: 6 %
Monocytes Absolute: 0.6 10*3/uL (ref 0.1–0.9)
NEUTROS ABS: 6.9 10*3/uL (ref 1.4–7.0)
NEUTROS PCT: 68 %
RBC: 4.69 x10E6/uL (ref 3.77–5.28)
RDW: 14.4 % (ref 12.3–15.4)
WBC: 10 10*3/uL (ref 3.4–10.8)

## 2018-05-15 LAB — VITAMIN D 25 HYDROXY (VIT D DEFICIENCY, FRACTURES): VIT D 25 HYDROXY: 30.9 ng/mL (ref 30.0–100.0)

## 2018-05-15 LAB — T3: T3 TOTAL: 137 ng/dL (ref 71–180)

## 2018-05-15 LAB — INSULIN, RANDOM: INSULIN: 41.6 u[IU]/mL — AB (ref 2.6–24.9)

## 2018-05-15 LAB — VITAMIN B12: Vitamin B-12: 262 pg/mL (ref 232–1245)

## 2018-05-15 LAB — LIPID PANEL WITH LDL/HDL RATIO
CHOLESTEROL TOTAL: 200 mg/dL — AB (ref 100–199)
HDL: 46 mg/dL (ref >39)
LDL Calculated: 129 mg/dL — ABNORMAL HIGH (ref 0–99)
LDl/HDL Ratio: 2.8 ratio (ref 0.0–3.2)
TRIGLYCERIDES: 125 mg/dL (ref 0–149)
VLDL Cholesterol Cal: 25 mg/dL (ref 5–40)

## 2018-05-15 LAB — T4, FREE: Free T4: 0.99 ng/dL (ref 0.82–1.77)

## 2018-05-15 LAB — HEMOGLOBIN A1C
ESTIMATED AVERAGE GLUCOSE: 148 mg/dL
Hgb A1c MFr Bld: 6.8 % — ABNORMAL HIGH (ref 4.8–5.6)

## 2018-05-15 LAB — TSH: TSH: 2.36 u[IU]/mL (ref 0.450–4.500)

## 2018-05-18 MED FILL — ALPRAZolam 0.5 MG TABS: 0.5 | 90 days supply | Qty: 180 | Fill #0

## 2018-05-18 NOTE — Progress Notes (Signed)
.  Office: [phone]  /  Fax: [phone]   HPI:   Chief Complaint: Stacey Clark (MR# [id number]) is a 47 y.o. female who presents on 05/14/2018 for obesity evaluation and treatment. Current BMI is Body mass index is 52.34 kg/m.Marland Kitchen Stacey Clark has struggled with obesity for years and has been unsuccessful in either losing weight or maintaining long term weight loss. Stacey Clark attended our information session and states she is currently in the action stage of change and ready to dedicate time achieving and maintaining a healthier weight.  Stacey Clark has done multiple things to lose weight like HCG and phentermine in the past, but hasn't been able to keep off weight.   Stacey Clark states her family eats meals together she thinks her family will eat healthier with  her her desired weight loss is 137 lbs she has been heavy most of  her life she started gaining weight last year around August her heaviest weight ever was 280 lbs she has significant food cravings issues  she skips meals frequently she is frequently drinking liquids with calories she frequently makes poor food choices she frequently eats larger portions than normal  she struggles with emotional eating    Fatigue Stacey Clark feels her energy is lower than it should be. This has worsened with weight gain and has not worsened recently. Stacey Clark admits to daytime somnolence and  admits to waking up still tired. Stacey Clark has a history of obstructive sleep apnea with the use of CPAP. Patent has a history of symptoms of daytime fatigue. Stacey Clark generally gets 6 hours of sleep per night, and states they generally have nightime awakenings. Snoring is present. Apneic episodes are present. Epworth Sleepiness Score is 7.  Dyspnea on exertion Dimitra notes increasing shortness of breath with exercising and seems to be worsening over time with weight gain. She notes getting out of breath sooner with activity than she used to. This has not gotten worse  recently. Sherrell denies orthopnea.  Diabetes II Stacey Clark has a diagnosis of diabetes type II. Fizza is on Trulicity and glimiperide, didn't tolerate metformin due to GI side effects. She is not checking BGs at home, unknown control. She denies any hypoglycemic episodes. She has been working on intensive lifestyle modifications including diet, exercise, and weight loss to help control her blood glucose levels.  Hyperlipidemia Stacey Clark has hyperlipidemia and she is not on a statin. She denies chest pain and would like to try to diet control her cholesterol levels with intensive lifestyle modification including a low saturated fat diet, exercise and weight loss.  Sleep Apnea Stacey Clark has sleep apnea. She has a CPAP but not using it. She notes fatigue, witnessed apnea, and snoring.  At risk for cardiovascular disease Stacey Clark is at a higher than average risk for cardiovascular disease due to obesity, diabetes II, hyperlipidemia, and sleep apnea. She currently denies any chest pain.  Depression Screen Stacey Clark (modified PHQ-9) score was  Depression screen PHQ 2/9 05/14/2018  Decreased Interest 3  Down, Depressed, Hopeless 2  PHQ - 2 Score 5  Altered sleeping 3  Tired, decreased energy 3  Change in appetite 3  Feeling bad or failure about yourself  0  Trouble concentrating 1  Moving slowly or fidgety/restless 0  Suicidal thoughts 0  PHQ-9 Score 15  Difficult doing work/chores Not difficult at all    ALLERGIES: Allergies  Allergen Reactions  . Cephalexin Swelling  . Hydrocodone Nausea And Vomiting  . Rifampin Nausea And Vomiting, Rash and  Other (See Comments)    Nausea, Vomiting  . Sulfa Antibiotics Hives  . Augmentin [Amoxicillin-Pot Clavulanate] Nausea And Vomiting  . Metformin And Related Other (See Comments)    GI upset  . Penicillins Swelling    MEDICATIONS: Current Outpatient Medications on File Prior to Visit  Medication Sig Dispense Refill  . ALPRAZolam (XANAX) 0.5 MG  tablet Take 0.5 mg by mouth at bedtime as needed.    Marland Kitchen buPROPion (WELLBUTRIN SR) 150 MG 12 hr tablet Take 150 mg by mouth 2 (two) times daily.    . Dulaglutide (TRULICITY) 1.5 ZO/1.0RU SOPN Inject 1.5 mg into the skin once a week.    . escitalopram (LEXAPRO) 5 MG tablet Take 5 mg by mouth daily.    Marland Kitchen glipiZIDE (GLUCOTROL) 5 MG tablet Take 5 mg by mouth 2 (two) times daily before a meal.     No current facility-administered medications on file prior to visit.     PAST MEDICAL HISTORY: Past Medical History:  Diagnosis Date  . Anxiety   . Asthma   . Back pain   . Depression   . Diabetes mellitus without complication ([place])   . GERD (gastroesophageal reflux disease)   . HLD (hyperlipidemia)   . IBS (irritable bowel syndrome)   . Joint pain   . Obesity   . Sleep apnea     PAST SURGICAL HISTORY: Past Surgical History:  Procedure Laterality Date  . CESAREAN SECTION    . TONSILLECTOMY      SOCIAL HISTORY: Social History   Tobacco Use  . Smoking status: Former Smoker    Last attempt to quit: 12/30/2006    Years since quitting: 11.3  . Smokeless tobacco: Never Used  Substance Use Topics  . Alcohol use: No    Alcohol/week: 0.0 oz  . Drug use: No    FAMILY HISTORY: Family History  Problem Relation Age of Onset  . Hypertension Mother   . Cancer Mother   . Depression Mother   . Anxiety disorder Mother   . Sleep apnea Mother   . Obesity Mother   . Hyperlipidemia Father   . Cancer Father   . Depression Father   . Anxiety disorder Father   . Bipolar disorder Father   . Alcoholism Father   . Obesity Father   . Thyroid disease Neg Hx     ROS: Review of Systems  Constitutional: Positive for malaise/fatigue. Negative for weight loss.       + Trouble sleeping  Eyes:       + Wear glasses or contacts  Respiratory: Positive for shortness of breath (with exertion).   Cardiovascular: Negative for chest pain and orthopnea.  Gastrointestinal: Positive for diarrhea and  heartburn.  Genitourinary: Positive for frequency.  Skin:       + Dryness  Neurological: Positive for weakness.  Endo/Heme/Allergies: Positive for polydipsia.  Psychiatric/Behavioral: Positive for depression. Negative for suicidal ideas.       + Stress    PHYSICAL EXAM: Blood pressure 115/74, pulse 61, temperature 98 F (36.7 C), temperature source Oral, height 5\' 1"  (1.549 m), weight 277 lb (125.6 kg), last menstrual period 04/28/2018, SpO2 98 %. Body mass index is 52.34 kg/m. Physical Exam  Constitutional: She is oriented to person, place, and time. She appears well-developed and well-nourished.  HENT:  Head: Normocephalic and atraumatic.  Nose: Nose normal.  Eyes: EOM are normal. No scleral icterus.  Neck: Normal range of motion. Neck supple. No thyromegaly present.  Cardiovascular: Normal rate.  Pulmonary/Chest: Effort normal. No respiratory distress.  Abdominal: Soft. There is no tenderness.  + Obesity  Musculoskeletal:  Range of Motion normal in all 4 extremities Trace edema noted in bilateral lower extremities  Neurological: She is alert and oriented to person, place, and time. Coordination normal.  Skin: Skin is warm and dry.  Psychiatric: She has a normal Clark and affect. Her behavior is normal.  Vitals reviewed.   RECENT LABS AND TESTS: BMET    Component Value Date/Time   NA 137 05/14/2018 0919   K 4.4 05/14/2018 0919   CL 101 05/14/2018 0919   CO2 22 05/14/2018 0919   GLUCOSE 85 05/14/2018 0919   BUN 9 05/14/2018 0919   CREATININE 0.75 05/14/2018 0919   CALCIUM 9.3 05/14/2018 0919   GFRNONAA 95 05/14/2018 0919   GFRAA 109 05/14/2018 0919   Lab Results  Component Value Date   HGBA1C 6.8 (H) 05/14/2018   Lab Results  Component Value Date   INSULIN 41.6 (H) 05/14/2018   CBC    Component Value Date/Time   WBC 10.0 05/14/2018 0919   WBC 13.3 (H) 01/15/2012 1237   RBC 4.69 05/14/2018 0919   RBC 4.90 01/15/2012 1237   HGB 13.2 05/14/2018 0919    HCT 39.8 05/14/2018 0919   PLT 326 01/15/2012 1237   MCV 85 05/14/2018 0919   MCH 28.1 05/14/2018 0919   MCH 29.6 01/15/2012 1237   MCHC 33.2 05/14/2018 0919   MCHC 34.0 01/15/2012 1237   RDW 14.4 05/14/2018 0919   LYMPHSABS 2.3 05/14/2018 0919   MONOABS 0.7 01/15/2012 1237   EOSABS 0.2 05/14/2018 0919   BASOSABS 0.1 05/14/2018 0919   Iron/TIBC/Ferritin/ %Sat No results found for: IRON, TIBC, FERRITIN, IRONPCTSAT Lipid Panel     Component Value Date/Time   CHOL 200 (H) 05/14/2018 0919   TRIG 125 05/14/2018 0919   HDL 46 05/14/2018 0919   LDLCALC 129 (H) 05/14/2018 0919   Hepatic Function Panel     Component Value Date/Time   PROT 6.6 05/14/2018 0919   ALBUMIN 3.9 05/14/2018 0919   AST 19 05/14/2018 0919   ALT 15 05/14/2018 0919   ALKPHOS 93 05/14/2018 0919   BILITOT <0.2 05/14/2018 0919      Component Value Date/Time   TSH 2.360 05/14/2018 0919   Vitamin D No recent labs  ECG  shows NSR with a rate of 62 BPM INDIRECT CALORIMETER done today shows a VO2 of 318 and a REE of 2211. Her calculated basal metabolic rate is 4174 thus her basal metabolic rate is better than expected.    ASSESSMENT AND PLAN: Other fatigue - Plan: EKG 12-Lead, Vitamin B12, CBC With Differential, Folate, T3, T4, free, TSH, VITAMIN D 25 Hydroxy (Vit-D Deficiency, Fractures)  Shortness of breath on exertion - Plan: CBC With Differential  Type 2 diabetes mellitus without complication, without long-term current use of insulin (HCC) - Plan: Comprehensive metabolic panel, Hemoglobin A1c, Insulin, random  Other hyperlipidemia - Plan: Lipid Panel With LDL/HDL Ratio  Obstructive sleep apnea syndrome  Depression screening  At risk for heart disease  Class 3 severe obesity with serious comorbidity and body mass index (BMI) of 50.0 to 59.9 in adult, unspecified obesity type (HCC)  PLAN:  Fatigue Stacey Clark was informed that her fatigue may be related to obesity, depression or many other causes.  Labs will be ordered, and in the meanwhile Stacey Clark has agreed to work on diet, exercise and weight loss to help with fatigue. Proper sleep hygiene was  discussed including the need for 7-8 hours of quality sleep each night. A sleep study was not ordered based on symptoms and Epworth score.  Dyspnea on exertion Stacey Clark's shortness of breath appears to be obesity related and exercise induced. She has agreed to work on weight loss and gradually increase exercise to treat her exercise induced shortness of breath. If Keyara follows our instructions and loses weight without improvement of her shortness of breath, we will plan to refer to pulmonology. We will monitor this condition regularly. Valicia agrees to this plan.  Diabetes II Stacey Clark has been given extensive diabetes education by myself today including ideal fasting and post-prandial blood glucose readings, individual ideal Hgb A1c goals and hypoglycemia prevention. We discussed the importance of good blood sugar control to decrease the likelihood of diabetic complications such as nephropathy, neuropathy, limb loss, blindness, coronary artery disease, and death. We discussed the importance of intensive lifestyle modification including diet, exercise and weight loss as the first line treatment for diabetes. We will check labs and Stacey Clark is to start diet and exercise. Stacey Clark agrees to continue her diabetes medications and she agrees to follow up with our clinic in 2 weeks.  Hyperlipidemia Stacey Clark was informed of the American Heart Association Guidelines emphasizing intensive lifestyle modifications as the first line treatment for hyperlipidemia. We discussed many lifestyle modifications today in depth, and Dandria will continue to work on decreasing saturated fats such as fatty red meat, butter and many fried foods. We will check labs and Stacey Clark is to start diet, and she will also increase vegetables and lean protein in her diet and continue to work on exercise and weight  loss efforts. Stacey Clark agrees to follow up with our clinic in 2 weeks.  Sleep Apnea Stacey Clark was educated on the importance of using CPAP and weight loss, she agreed to start using CPAP. Stacey Clark agrees to follow up with our clinic in 2 weeks.  Cardiovascular risk counselling Stacey Clark was given extended (15 minutes) coronary artery disease prevention counseling today. She is 47 y.o. female and has risk factors for heart disease including obesity, diabetes II, hyperlipidemia, and sleep apnea. We discussed intensive lifestyle modifications today with an emphasis on specific weight loss instructions and strategies. Pt was also informed of the importance of increasing exercise and decreasing saturated fats to help prevent heart disease.  Depression Screen Stacey Clark had a strongly positive depression screening. Depression is commonly associated with obesity and often results in emotional eating behaviors. We will monitor this closely and work on CBT to help improve the non-hunger eating patterns. Referral to Psychology may be required if no improvement is seen as she continues in our clinic.  Obesity Stacey Clark is currently in the action stage of change and her goal is to continue with weight loss efforts She has agreed to follow the Category 3 plan Stacey Clark has been instructed to work up to a goal of 150 minutes of combined cardio and strengthening exercise per week for weight loss and overall health benefits. We discussed the following Behavioral Modification Strategies today: increasing lean protein intake, decreasing simple carbohydrates  and work on meal planning and easy cooking plans  Calisa has agreed to follow up with our clinic in 2 weeks. She was informed of the importance of frequent follow up visits to maximize her success with intensive lifestyle modifications for her multiple health conditions. She was informed we would discuss her lab results at her next visit unless there is a critical issue that needs to be  addressed  sooner. Antigone agreed to keep her next visit at the agreed upon time to discuss these results.    OBESITY BEHAVIORAL INTERVENTION VISIT  Today's visit was # 1 out of 22.  Starting weight: 277 lbs Starting date: 05/14/18 Today's weight : 277 lbs  Today's date: 05/14/2018 Total lbs lost to date: 0 (Patients must lose 7 lbs in the first 6 months to continue with counseling)   ASK: We discussed the diagnosis of obesity with Julieta Gutting today and Nikesha agreed to give [place] permission to discuss obesity behavioral modification therapy today.  ASSESS: Tavi has the diagnosis of obesity and her BMI today is 52.37 Poet is in the action stage of change   ADVISE: Cherisa was educated on the multiple health risks of obesity as well as the benefit of weight loss to improve her health. She was advised of the need for long term treatment and the importance of lifestyle modifications.  AGREE: Multiple dietary modification options and treatment options were discussed and  Kenzlie agreed to the above obesity treatment plan.   I, Trixie Dredge, am acting as transcriptionist for Dennard Nip, MD   I have reviewed the above documentation for accuracy and completeness, and I agree with the above. -Dennard Nip, MD

## 2018-05-20 ENCOUNTER — Encounter (INDEPENDENT_AMBULATORY_CARE_PROVIDER_SITE_OTHER): Payer: Self-pay | Admitting: Family Medicine

## 2018-05-28 ENCOUNTER — Ambulatory Visit (INDEPENDENT_AMBULATORY_CARE_PROVIDER_SITE_OTHER): Payer: 59 | Admitting: Family Medicine

## 2018-05-28 VITALS — BP 116/76 | HR 72 | Temp 97.9°F | Ht 61.0 in | Wt 269.0 lb

## 2018-05-28 DIAGNOSIS — Z6841 Body Mass Index (BMI) 40.0 and over, adult: Secondary | ICD-10-CM | POA: Diagnosis not present

## 2018-05-28 DIAGNOSIS — E7849 Other hyperlipidemia: Secondary | ICD-10-CM

## 2018-05-28 DIAGNOSIS — Z9189 Other specified personal risk factors, not elsewhere classified: Secondary | ICD-10-CM

## 2018-05-28 DIAGNOSIS — E559 Vitamin D deficiency, unspecified: Secondary | ICD-10-CM

## 2018-05-28 DIAGNOSIS — E119 Type 2 diabetes mellitus without complications: Secondary | ICD-10-CM | POA: Diagnosis not present

## 2018-05-28 MED ORDER — VITAMIN D (ERGOCALCIFEROL) 1.25 MG (50000 UNIT) PO CAPS: 50000.0000 [IU] | ORAL_CAPSULE | ORAL | 0 refills | Status: DC

## 2018-05-29 MED FILL — VIT D2 1.25 MG (50,000 UNIT: 1.25 MG | 28 days supply | Qty: 4 | Fill #0

## 2018-05-29 MED FILL — TRULICITY 0.75 MG/0.5 ML PE: 0.75 | 28 days supply | Qty: 2 | Fill #0

## 2018-05-29 NOTE — Progress Notes (Signed)
Office: [phone]  /  Fax: [phone]   HPI:   Chief Complaint: OBESITY Stacey Clark is here to discuss her progress with her obesity treatment plan. She is on the  follow the Category 3 plan and is following her eating plan approximately 95 % of the time. She states she is exercising 30 minutes 7 times per week. Stacey Clark continues to do well with weight loss on the Category 3 meal plan but did get tired of eating eggs. She struggles to eat all her food, her hunger was controlled but she would like to look at additional options. Her weight is 269 lb (122 kg) today and has had a weight loss of 2 pounds over a period of 2 weeks since her last visit. She has lost 8 lbs since starting treatment with [place].  Hyperlipidemia Stacey Clark has hyperlipidemia and has been trying to improve her cholesterol levels with intensive lifestyle modification including a low saturated fat diet, exercise and weight loss. She denies any chest pain, claudication or myalgias. She is currently on a statin.   Vitamin D deficiency Stacey Clark has a new diagnosis of vitamin D deficiency. She is not currently taking vit D and denies nausea, vomiting or muscle weakness. She admits to having fatigue.   Diabetes II Stacey Clark has a diagnosis of diabetes type II. Stacey Clark states BGs range between 95 and 125 and denies any hypoglycemic episodes. Last A1c was Hemoglobin A1C Latest Ref Rng & Units 05/14/2018  HGBA1C 4.8 - 5.6 % 6.8(H)  Some recent data might be hidden    She has been working on intensive lifestyle modifications including diet, exercise, and weight loss to help control her blood glucose levels. We discontinued her glipizide due to high chances of hypoglycemia. Her polyphagia has dereased.    At risk for cardiovascular disease Stacey Clark is at a higher than average risk for cardiovascular disease due to obesity. She currently denies any chest pain.    ALLERGIES: Allergies  Allergen Reactions  . Cephalexin Swelling  . Hydrocodone  Nausea And Vomiting  . Rifampin Nausea And Vomiting, Rash and Other (See Comments)    Nausea, Vomiting  . Sulfa Antibiotics Hives  . Augmentin [Amoxicillin-Pot Clavulanate] Nausea And Vomiting  . Metformin And Related Other (See Comments)    GI upset  . Penicillins Swelling    MEDICATIONS: Current Outpatient Medications on File Prior to Visit  Medication Sig Dispense Refill  . ALPRAZolam (XANAX) 0.5 MG tablet Take 0.5 mg by mouth at bedtime as needed.    Marland Kitchen buPROPion (WELLBUTRIN SR) 150 MG 12 hr tablet Take 150 mg by mouth 2 (two) times daily.    . Dulaglutide (TRULICITY) 1.5 AS/3.4HD SOPN Inject 1.5 mg into the skin once a week.    . escitalopram (LEXAPRO) 5 MG tablet Take 5 mg by mouth daily.     No current facility-administered medications on file prior to visit.     PAST MEDICAL HISTORY: Past Medical History:  Diagnosis Date  . Anxiety   . Asthma   . Back pain   . Depression   . Diabetes mellitus without complication ([place])   . GERD (gastroesophageal reflux disease)   . HLD (hyperlipidemia)   . IBS (irritable bowel syndrome)   . Joint pain   . Obesity   . Sleep apnea     PAST SURGICAL HISTORY: Past Surgical History:  Procedure Laterality Date  . CESAREAN SECTION    . TONSILLECTOMY      SOCIAL HISTORY: Social History   Tobacco  Use  . Smoking status: Former Smoker    Last attempt to quit: 12/30/2006    Years since quitting: 11.4  . Smokeless tobacco: Never Used  Substance Use Topics  . Alcohol use: No    Alcohol/week: 0.0 oz  . Drug use: No    FAMILY HISTORY: Family History  Problem Relation Age of Onset  . Hypertension Mother   . Cancer Mother   . Depression Mother   . Anxiety disorder Mother   . Sleep apnea Mother   . Obesity Mother   . Hyperlipidemia Father   . Cancer Father   . Depression Father   . Anxiety disorder Father   . Bipolar disorder Father   . Alcoholism Father   . Obesity Father   . Thyroid disease Neg Hx     ROS: Review of  Systems  Constitutional: Positive for malaise/fatigue and weight loss.  All other systems reviewed and are negative.   PHYSICAL EXAM: Blood pressure 116/76, pulse 72, temperature 97.9 F (36.6 C), temperature source Oral, height 5\' 1"  (1.549 m), weight 269 lb (122 kg), last menstrual period 04/28/2018, SpO2 97 %. Body mass index is 50.83 kg/m. Physical Exam  Constitutional: She appears well-developed.  HENT:  Head: Normocephalic.  Eyes: EOM are normal.  Neck: Normal range of motion.  Pulmonary/Chest: Effort normal.  Neurological: She is alert.  Skin: Skin is warm and dry.  Psychiatric: She has a normal mood and affect.  Vitals reviewed.   RECENT LABS AND TESTS: BMET    Component Value Date/Time   NA 137 05/14/2018 0919   K 4.4 05/14/2018 0919   CL 101 05/14/2018 0919   CO2 22 05/14/2018 0919   GLUCOSE 85 05/14/2018 0919   BUN 9 05/14/2018 0919   CREATININE 0.75 05/14/2018 0919   CALCIUM 9.3 05/14/2018 0919   GFRNONAA 95 05/14/2018 0919   GFRAA 109 05/14/2018 0919   Lab Results  Component Value Date   HGBA1C 6.8 (H) 05/14/2018   Lab Results  Component Value Date   INSULIN 41.6 (H) 05/14/2018   CBC    Component Value Date/Time   WBC 10.0 05/14/2018 0919   WBC 13.3 (H) 01/15/2012 1237   RBC 4.69 05/14/2018 0919   RBC 4.90 01/15/2012 1237   HGB 13.2 05/14/2018 0919   HCT 39.8 05/14/2018 0919   PLT 326 01/15/2012 1237   MCV 85 05/14/2018 0919   MCH 28.1 05/14/2018 0919   MCH 29.6 01/15/2012 1237   MCHC 33.2 05/14/2018 0919   MCHC 34.0 01/15/2012 1237   RDW 14.4 05/14/2018 0919   LYMPHSABS 2.3 05/14/2018 0919   MONOABS 0.7 01/15/2012 1237   EOSABS 0.2 05/14/2018 0919   BASOSABS 0.1 05/14/2018 0919   Iron/TIBC/Ferritin/ %Sat No results found for: IRON, TIBC, FERRITIN, IRONPCTSAT Lipid Panel     Component Value Date/Time   CHOL 200 (H) 05/14/2018 0919   TRIG 125 05/14/2018 0919   HDL 46 05/14/2018 0919   LDLCALC 129 (H) 05/14/2018 0919   Hepatic  Function Panel     Component Value Date/Time   PROT 6.6 05/14/2018 0919   ALBUMIN 3.9 05/14/2018 0919   AST 19 05/14/2018 0919   ALT 15 05/14/2018 0919   ALKPHOS 93 05/14/2018 0919   BILITOT <0.2 05/14/2018 0919      Component Value Date/Time   TSH 2.360 05/14/2018 0919    ASSESSMENT AND PLAN: Other hyperlipidemia  Vitamin D deficiency  At risk for heart disease  Type 2 diabetes mellitus without complication,  without long-term current use of insulin (HCC)  Class 3 severe obesity with serious comorbidity and body mass index (BMI) of 50.0 to 59.9 in adult, unspecified obesity type ([place])  PLAN: Hyperlipidemia Stacey Clark was informed of the American Heart Association Guidelines emphasizing intensive lifestyle modifications as the first line treatment for hyperlipidemia. We discussed many lifestyle modifications today in depth, and Stacey Clark will continue to work on decreasing saturated fats such as fatty red meat, butter and many fried foods. She will also increase vegetables and lean protein in her diet and continue to work on exercise and weight loss efforts. Will recheck labs in 3 months.   Vitamin D Deficiency Stacey Clark was informed that low vitamin D levels contributes to fatigue and are associated with obesity, breast, and colon cancer. She agrees to start a new prescription Vit D @50 ,000 IU every week and will follow up for routine testing of vitamin D, at least 2-3 times per year. She was informed of the risk of over-replacement of vitamin D and agrees to not increase her dose unless she discusses this with [place] first.  Diabetes II Stacey Clark has been given extensive diabetes education by myself today including ideal fasting and post-prandial blood glucose readings, individual ideal HgA1c goals  and hypoglycemia prevention. We discussed the importance of good blood sugar control to decrease the likelihood of diabetic complications such as nephropathy, neuropathy, limb loss, blindness, coronary  artery disease, and death. We discussed the importance of intensive lifestyle modification including diet, exercise and weight loss as the first line treatment for diabetes. Stacey Clark agrees to continue her diabetes medications and will follow up at the agreed upon time. Will trulicity as prescribed at this current time and follow.   Cardiovascular risk counselling Stacey Clark was given extended (15 minutes) coronary artery disease prevention counseling today. She is 47 y.o. female and has risk factors for heart disease including obesity. We discussed intensive lifestyle modifications today with an emphasis on specific weight loss instructions and strategies. Pt was also informed of the importance of increasing exercise and decreasing saturated fats to help prevent heart disease.  Obesity Stacey Clark is currently in the action stage of change. As such, her goal is to continue with weight loss efforts She has agreed to follow the Category 3 plan and journaling 300-400 calories plus 30 grams of protein for breakfast.  Stacey Clark has been instructed to work up to a goal of 150 minutes of combined cardio and strengthening exercise per week for weight loss and overall health benefits. We discussed the following Behavioral Modification Stratagies today: increasing lean protein intake, decreasing simple carbohydrates  and work on meal planning and easy cooking plans   Stacey Clark has agreed to follow up with our clinic in 3 weeks. She was informed of the importance of frequent follow up visits to maximize her success with intensive lifestyle modifications for her multiple health conditions.   OBESITY BEHAVIORAL INTERVENTION VISIT  Today's visit was # 2 out of 22.  Starting weight: 277 Starting date: 05/14/18 Today's weight : Weight: 269 lb (122 kg)  Today's date: 05/29/2018 Total lbs lost to date: 8 (Patients must lose 7 lbs in the first 6 months to continue with counseling)   ASK: We discussed the diagnosis of obesity  with Stacey Clark today and Stacey Clark agreed to give [place] permission to discuss obesity behavioral modification therapy today.  ASSESS: Stacey Clark has the diagnosis of obesity and her BMI today is @TBMI @ Stacey Clark is in the action stage of change   ADVISE:  Stacey Clark was educated on the multiple health risks of obesity as well as the benefit of weight loss to improve her health. She was advised of the need for long term treatment and the importance of lifestyle modifications.  AGREE: Multiple dietary modification options and treatment options were discussed and  Stacey Clark agreed to the above obesity treatment plan.  I, April Moore, am acting as Location manager for Dennard Nip, MD  I have reviewed the above documentation for accuracy and completeness, and I agree with the above. -Dennard Nip, MD

## 2018-06-16 ENCOUNTER — Ambulatory Visit (INDEPENDENT_AMBULATORY_CARE_PROVIDER_SITE_OTHER): Payer: 59 | Admitting: Family Medicine

## 2018-06-16 VITALS — BP 112/76 | HR 62 | Temp 98.2°F | Ht 61.0 in | Wt 266.0 lb

## 2018-06-16 DIAGNOSIS — Z9189 Other specified personal risk factors, not elsewhere classified: Secondary | ICD-10-CM

## 2018-06-16 DIAGNOSIS — E559 Vitamin D deficiency, unspecified: Secondary | ICD-10-CM

## 2018-06-16 DIAGNOSIS — E119 Type 2 diabetes mellitus without complications: Secondary | ICD-10-CM | POA: Diagnosis not present

## 2018-06-16 DIAGNOSIS — Z6841 Body Mass Index (BMI) 40.0 and over, adult: Secondary | ICD-10-CM

## 2018-06-16 MED ORDER — VITAMIN D (ERGOCALCIFEROL) 1.25 MG (50000 UNIT) PO CAPS: 50000.0000 [IU] | ORAL_CAPSULE | ORAL | 0 refills | Status: DC

## 2018-06-16 NOTE — Progress Notes (Signed)
Office: [phone]  /  Fax: [phone]   HPI:   Chief Complaint: OBESITY Stacey Clark is here to discuss her progress with her obesity treatment plan. She is on the keep a food journal with 300 to 400 calories and 30 grams of protein for breakfast and the Category 3 plan and is following her eating plan approximately 95 % of the time. She states she is walking for 20 minutes 5 times per week. Stacey Clark continues to los weight on the category 3 plan, but she struggles to eat all her dinner. She was allowed to journal for breakfast and would like to journal all day. Her weight is 266 lb (120.7 kg) today and has had a weight loss of 3 pounds over a period of 2 to 3 weeks since her last visit. She has lost 11 lbs since starting treatment with [place].  Vitamin D deficiency Stacey Clark has a diagnosis of vitamin D deficiency. Stacey Clark is stable on vit D, but she is not yet at goal. Fatigue is improving and she denies nausea, vomiting or muscle weakness.  Diabetes II Stacey Clark has a diagnosis of diabetes type II. She is stable on Trulicity. Stacey Clark's fasting BGs range in the 90'smostly, per patient. She is doing well on her diet prescription and she denies any hypoglycemic episodes. Last A1c was at 6.8 She has been working on intensive lifestyle modifications including diet, exercise, and weight loss to help control her blood glucose levels.  At risk for cardiovascular disease Stacey Clark is at a higher than average risk for cardiovascular disease due to obesity and diabetes. She currently denies any chest pain.  ALLERGIES: Allergies  Allergen Reactions  . Cephalexin Swelling  . Hydrocodone Nausea And Vomiting  . Rifampin Nausea And Vomiting, Rash and Other (See Comments)    Nausea, Vomiting  . Sulfa Antibiotics Hives  . Augmentin [Amoxicillin-Pot Clavulanate] Nausea And Vomiting  . Metformin And Related Other (See Comments)    GI upset  . Penicillins Swelling    MEDICATIONS: Current Outpatient Medications on File  Prior to Visit  Medication Sig Dispense Refill  . ALPRAZolam (XANAX) 0.5 MG tablet Take 0.5 mg by mouth at bedtime as needed.    Stacey Clark Kitchen buPROPion (WELLBUTRIN SR) 150 MG 12 hr tablet Take 150 mg by mouth 2 (two) times daily.    . Dulaglutide (TRULICITY) 1.5 OV/5.6EP SOPN Inject 1.5 mg into the skin once a week.    . escitalopram (LEXAPRO) 5 MG tablet Take 5 mg by mouth daily.     No current facility-administered medications on file prior to visit.     PAST MEDICAL HISTORY: Past Medical History:  Diagnosis Date  . Anxiety   . Asthma   . Back pain   . Depression   . Diabetes mellitus without complication ([place])   . GERD (gastroesophageal reflux disease)   . HLD (hyperlipidemia)   . IBS (irritable bowel syndrome)   . Joint pain   . Obesity   . Sleep apnea     PAST SURGICAL HISTORY: Past Surgical History:  Procedure Laterality Date  . CESAREAN SECTION    . TONSILLECTOMY      SOCIAL HISTORY: Social History   Tobacco Use  . Smoking status: Former Smoker    Last attempt to quit: 12/30/2006    Years since quitting: 11.4  . Smokeless tobacco: Never Used  Substance Use Topics  . Alcohol use: No    Alcohol/week: 0.0 oz  . Drug use: No    FAMILY HISTORY: Family History  Problem Relation Age of Onset  . Hypertension Mother   . Cancer Mother   . Depression Mother   . Anxiety disorder Mother   . Sleep apnea Mother   . Obesity Mother   . Hyperlipidemia Father   . Cancer Father   . Depression Father   . Anxiety disorder Father   . Bipolar disorder Father   . Alcoholism Father   . Obesity Father   . Thyroid disease Neg Hx     ROS: Review of Systems  Constitutional: Positive for malaise/fatigue and weight loss.  Cardiovascular: Negative for chest pain.  Gastrointestinal: Negative for nausea and vomiting.  Musculoskeletal:       Negative for muscle weakness  Endo/Heme/Allergies:       Negative for hypoglycemia    PHYSICAL EXAM: Blood pressure 112/76, pulse 62,  temperature 98.2 F (36.8 C), temperature source Oral, height 5\' 1"  (1.549 m), weight 266 lb (120.7 kg), last menstrual period 04/30/2018, SpO2 97 %. Body mass index is 50.26 kg/m. Physical Exam  Constitutional: She is oriented to person, place, and time. She appears well-developed and well-nourished.  Cardiovascular: Normal rate.  Pulmonary/Chest: Effort normal.  Musculoskeletal: Normal range of motion.  Neurological: She is oriented to person, place, and time.  Skin: Skin is warm and dry.  Psychiatric: She has a normal mood and affect. Her behavior is normal.  Vitals reviewed.   RECENT LABS AND TESTS: BMET    Component Value Date/Time   NA 137 05/14/2018 0919   K 4.4 05/14/2018 0919   CL 101 05/14/2018 0919   CO2 22 05/14/2018 0919   GLUCOSE 85 05/14/2018 0919   BUN 9 05/14/2018 0919   CREATININE 0.75 05/14/2018 0919   CALCIUM 9.3 05/14/2018 0919   GFRNONAA 95 05/14/2018 0919   GFRAA 109 05/14/2018 0919   Lab Results  Component Value Date   HGBA1C 6.8 (H) 05/14/2018   Lab Results  Component Value Date   INSULIN 41.6 (H) 05/14/2018   CBC    Component Value Date/Time   WBC 10.0 05/14/2018 0919   WBC 13.3 (H) 01/15/2012 1237   RBC 4.69 05/14/2018 0919   RBC 4.90 01/15/2012 1237   HGB 13.2 05/14/2018 0919   HCT 39.8 05/14/2018 0919   PLT 326 01/15/2012 1237   MCV 85 05/14/2018 0919   MCH 28.1 05/14/2018 0919   MCH 29.6 01/15/2012 1237   MCHC 33.2 05/14/2018 0919   MCHC 34.0 01/15/2012 1237   RDW 14.4 05/14/2018 0919   LYMPHSABS 2.3 05/14/2018 0919   MONOABS 0.7 01/15/2012 1237   EOSABS 0.2 05/14/2018 0919   BASOSABS 0.1 05/14/2018 0919   Iron/TIBC/Ferritin/ %Sat No results found for: IRON, TIBC, FERRITIN, IRONPCTSAT Lipid Panel     Component Value Date/Time   CHOL 200 (H) 05/14/2018 0919   TRIG 125 05/14/2018 0919   HDL 46 05/14/2018 0919   LDLCALC 129 (H) 05/14/2018 0919   Hepatic Function Panel     Component Value Date/Time   PROT 6.6 05/14/2018  0919   ALBUMIN 3.9 05/14/2018 0919   AST 19 05/14/2018 0919   ALT 15 05/14/2018 0919   ALKPHOS 93 05/14/2018 0919   BILITOT <0.2 05/14/2018 0919      Component Value Date/Time   TSH 2.360 05/14/2018 0919   Results for [name], WIEGAND (MRN [id number]) as of 06/16/2018 14:11  Ref. Range 05/14/2018 09:19  Vitamin D, 25-Hydroxy Latest Ref Range: 30.0 - 100.0 ng/mL 30.9   ASSESSMENT AND PLAN: Type 2 diabetes mellitus  without complication, without long-term current use of insulin (HCC)  At risk for heart disease  Vitamin D deficiency - Plan: Vitamin D, Ergocalciferol, (DRISDOL) 50000 units CAPS capsule  Class 3 severe obesity with serious comorbidity and body mass index (BMI) of 50.0 to 59.9 in adult, unspecified obesity type (HCC)  PLAN:  Vitamin D Deficiency Stacey Clark was informed that low vitamin D levels contributes to fatigue and are associated with obesity, breast, and colon cancer. She agrees to continue to take prescription Vit D @50 ,000 IU every week #4 with no refills and will follow up for routine testing of vitamin D, at least 2-3 times per year. She was informed of the risk of over-replacement of vitamin D and agrees to not increase her dose unless she discusses this with [place] first. Stacey Clark agrees to follow up as directed.  Diabetes II Stacey Clark has been given extensive diabetes education by myself today including ideal fasting and post-prandial blood glucose readings, individual ideal Hgb A1c goals and hypoglycemia prevention. We discussed the importance of good blood sugar control to decrease the likelihood of diabetic complications such as nephropathy, neuropathy, limb loss, blindness, coronary artery disease, and death. We discussed the importance of intensive lifestyle modification including diet, exercise and weight loss as the first line treatment for diabetes. Stacey Clark agrees to continue Trulicity, diet and exercise and she will follow up at the agreed upon time.  Cardiovascular risk  counseling Stacey Clark was given extended (15 minutes) coronary artery disease prevention counseling today. She is 47 y.o. female and has risk factors for heart disease including obesity and diabetes. We discussed intensive lifestyle modifications today with an emphasis on specific weight loss instructions and strategies. Pt was also informed of the importance of increasing exercise and decreasing saturated fats to help prevent heart disease.  Obesity Stacey Clark is currently in the action stage of change. As such, her goal is to continue with weight loss efforts She has agreed to change to keeping a food journal with 1400 to 1600 calories and 100+ grams of protein daily Stacey Clark has been instructed to work up to a goal of 150 minutes of combined cardio and strengthening exercise per week for weight loss and overall health benefits. We discussed the following Behavioral Modification Strategies today: increasing lean protein intake, decreasing simple carbohydrates  and work on meal planning and easy cooking plans  Stacey Clark has agreed to follow up with our clinic in 2 to 3 weeks. She was informed of the importance of frequent follow up visits to maximize her success with intensive lifestyle modifications for her multiple health conditions.   OBESITY BEHAVIORAL INTERVENTION VISIT  Today's visit was # 3 out of 22.  Starting weight: 277 lbs Starting date: 05/14/18 Today's weight : 266 lbs  Today's date: 06/16/2018 Total lbs lost to date: 11 (Patients must lose 7 lbs in the first 6 months to continue with counseling)   ASK: We discussed the diagnosis of obesity with Julieta Gutting today and [place] agreed to give [place] permission to discuss obesity behavioral modification therapy today.  ASSESS: Mahdiya has the diagnosis of obesity and her BMI today is 50.29 Tashia is in the action stage of change   ADVISE: Stacey Clark was educated on the multiple health risks of obesity as well as the benefit of weight loss to improve her  health. She was advised of the need for long term treatment and the importance of lifestyle modifications.  AGREE: Multiple dietary modification options and treatment options were discussed and  Stacey Clark  agreed to the above obesity treatment plan.  I, Doreene Nest, am acting as transcriptionist for Dennard Nip, MD  I have reviewed the above documentation for accuracy and completeness, and I agree with the above. -Dennard Nip, MD

## 2018-06-22 MED FILL — ESCITALOPRAM 20 MG TABLET: 20 | 90 days supply | Qty: 90 | Fill #0

## 2018-06-22 MED FILL — TRULICITY 0.75 MG/0.5 ML PE: 0.75 | 28 days supply | Qty: 2 | Fill #1

## 2018-06-22 MED FILL — VIT D2 1.25 MG (50,000 UNIT: 1.25 MG | 28 days supply | Qty: 4 | Fill #0

## 2018-06-25 DIAGNOSIS — R1314 Dysphagia, pharyngoesophageal phase: Secondary | ICD-10-CM | POA: Diagnosis not present

## 2018-06-25 DIAGNOSIS — E1165 Type 2 diabetes mellitus with hyperglycemia: Secondary | ICD-10-CM | POA: Diagnosis not present

## 2018-06-30 ENCOUNTER — Ambulatory Visit (INDEPENDENT_AMBULATORY_CARE_PROVIDER_SITE_OTHER): Payer: 59 | Admitting: Physician Assistant

## 2018-07-01 ENCOUNTER — Ambulatory Visit (INDEPENDENT_AMBULATORY_CARE_PROVIDER_SITE_OTHER): Payer: 59 | Admitting: Physician Assistant

## 2018-07-01 VITALS — BP 102/68 | HR 83 | Temp 98.0°F | Ht 61.0 in | Wt 264.0 lb

## 2018-07-01 DIAGNOSIS — E7849 Other hyperlipidemia: Secondary | ICD-10-CM

## 2018-07-01 DIAGNOSIS — Z9189 Other specified personal risk factors, not elsewhere classified: Secondary | ICD-10-CM | POA: Diagnosis not present

## 2018-07-01 DIAGNOSIS — E119 Type 2 diabetes mellitus without complications: Secondary | ICD-10-CM

## 2018-07-01 DIAGNOSIS — Z6841 Body Mass Index (BMI) 40.0 and over, adult: Secondary | ICD-10-CM

## 2018-07-01 NOTE — Progress Notes (Signed)
Office: [phone]  /  Fax: [phone]   HPI:   Chief Complaint: OBESITY Stacey Clark is here to discuss her progress with her obesity treatment plan. She is on the keep a food journal with 1400-1600 calories and 100+ grams of protein daily and is following her eating plan approximately 100 % of the time. She states she is walking the dog for 15-20 minutes 7 times per week. Stacey Clark continues to do well with weight loss. She incorporates protein shakes on occasions.  Her weight is 264 lb (119.7 kg) today and has had a weight loss of 2 pounds over a period of 2 weeks since her last visit. She has lost 13 lbs since starting treatment with [place].  Hyperlipidemia Stacey Clark has hyperlipidemia and has been trying to improve her cholesterol levels with intensive lifestyle modification including a low saturated fat diet, exercise and weight loss. She is not on medications, declines. She denies any chest pain, claudication or myalgias.  Diabetes II Stacey Clark has a diagnosis of diabetes type II. Stacey Clark states fasting BGs range between 80 and 90's and she denies any hypoglycemic episodes. She is on Trulicity. Last A1c was 6.8. She has been working on intensive lifestyle modifications including diet, exercise, and weight loss to help control her blood glucose levels.  At risk for cardiovascular disease Stacey Clark is at a higher than average risk for cardiovascular disease due to obesity, hyperlipidemia, and diabetes II. She currently denies any chest pain.  ALLERGIES: Allergies  Allergen Reactions  . Cephalexin Swelling  . Hydrocodone Nausea And Vomiting  . Rifampin Nausea And Vomiting, Rash and Other (See Comments)    Nausea, Vomiting  . Sulfa Antibiotics Hives  . Augmentin [Amoxicillin-Pot Clavulanate] Nausea And Vomiting  . Metformin And Related Other (See Comments)    GI upset  . Penicillins Swelling    MEDICATIONS: Current Outpatient Medications on File Prior to Visit  Medication Sig Dispense Refill  .  ALPRAZolam (XANAX) 0.5 MG tablet Take 0.5 mg by mouth at bedtime as needed.    Marland Kitchen buPROPion (WELLBUTRIN SR) 150 MG 12 hr tablet Take 150 mg by mouth 2 (two) times daily.    . Dulaglutide (TRULICITY) 1.5 JS/9.7WY SOPN Inject 1.5 mg into the skin once a week.    . escitalopram (LEXAPRO) 5 MG tablet Take 5 mg by mouth daily.    . Vitamin D, Ergocalciferol, (DRISDOL) 50000 units CAPS capsule Take 1 capsule (50,000 Units total) by mouth every 7 (seven) days. 4 capsule 0   No current facility-administered medications on file prior to visit.     PAST MEDICAL HISTORY: Past Medical History:  Diagnosis Date  . Anxiety   . Asthma   . Back pain   . Depression   . Diabetes mellitus without complication ([place])   . GERD (gastroesophageal reflux disease)   . HLD (hyperlipidemia)   . IBS (irritable bowel syndrome)   . Joint pain   . Obesity   . Sleep apnea     PAST SURGICAL HISTORY: Past Surgical History:  Procedure Laterality Date  . CESAREAN SECTION    . TONSILLECTOMY      SOCIAL HISTORY: Social History   Tobacco Use  . Smoking status: Former Smoker    Last attempt to quit: 12/30/2006    Years since quitting: 11.5  . Smokeless tobacco: Never Used  Substance Use Topics  . Alcohol use: No    Alcohol/week: 0.0 oz  . Drug use: No    FAMILY HISTORY: Family History  Problem  Relation Age of Onset  . Hypertension Mother   . Cancer Mother   . Depression Mother   . Anxiety disorder Mother   . Sleep apnea Mother   . Obesity Mother   . Hyperlipidemia Father   . Cancer Father   . Depression Father   . Anxiety disorder Father   . Bipolar disorder Father   . Alcoholism Father   . Obesity Father   . Thyroid disease Neg Hx     ROS: Review of Systems  Constitutional: Positive for weight loss.  Cardiovascular: Negative for chest pain and claudication.  Musculoskeletal: Negative for myalgias.  Endo/Heme/Allergies:       Negative hypoglycemia    PHYSICAL EXAM: Blood pressure  102/68, pulse 83, temperature 98 F (36.7 C), temperature source Oral, height 5\' 1"  (1.549 m), weight 264 lb (119.7 kg), SpO2 98 %. Body mass index is 49.88 kg/m. Physical Exam  Constitutional: She is oriented to person, place, and time. She appears well-developed and well-nourished.  Cardiovascular: Normal rate.  Pulmonary/Chest: Effort normal.  Musculoskeletal: Normal range of motion.  Neurological: She is oriented to person, place, and time.  Skin: Skin is warm and dry.  Psychiatric: She has a normal mood and affect. Her behavior is normal.  Vitals reviewed.   RECENT LABS AND TESTS: BMET    Component Value Date/Time   NA 137 05/14/2018 0919   K 4.4 05/14/2018 0919   CL 101 05/14/2018 0919   CO2 22 05/14/2018 0919   GLUCOSE 85 05/14/2018 0919   BUN 9 05/14/2018 0919   CREATININE 0.75 05/14/2018 0919   CALCIUM 9.3 05/14/2018 0919   GFRNONAA 95 05/14/2018 0919   GFRAA 109 05/14/2018 0919   Lab Results  Component Value Date   HGBA1C 6.8 (H) 05/14/2018   Lab Results  Component Value Date   INSULIN 41.6 (H) 05/14/2018   CBC    Component Value Date/Time   WBC 10.0 05/14/2018 0919   WBC 13.3 (H) 01/15/2012 1237   RBC 4.69 05/14/2018 0919   RBC 4.90 01/15/2012 1237   HGB 13.2 05/14/2018 0919   HCT 39.8 05/14/2018 0919   PLT 326 01/15/2012 1237   MCV 85 05/14/2018 0919   MCH 28.1 05/14/2018 0919   MCH 29.6 01/15/2012 1237   MCHC 33.2 05/14/2018 0919   MCHC 34.0 01/15/2012 1237   RDW 14.4 05/14/2018 0919   LYMPHSABS 2.3 05/14/2018 0919   MONOABS 0.7 01/15/2012 1237   EOSABS 0.2 05/14/2018 0919   BASOSABS 0.1 05/14/2018 0919   Iron/TIBC/Ferritin/ %Sat No results found for: IRON, TIBC, FERRITIN, IRONPCTSAT Lipid Panel     Component Value Date/Time   CHOL 200 (H) 05/14/2018 0919   TRIG 125 05/14/2018 0919   HDL 46 05/14/2018 0919   LDLCALC 129 (H) 05/14/2018 0919   Hepatic Function Panel     Component Value Date/Time   PROT 6.6 05/14/2018 0919   ALBUMIN  3.9 05/14/2018 0919   AST 19 05/14/2018 0919   ALT 15 05/14/2018 0919   ALKPHOS 93 05/14/2018 0919   BILITOT <0.2 05/14/2018 0919      Component Value Date/Time   TSH 2.360 05/14/2018 0919    ASSESSMENT AND PLAN: Other hyperlipidemia  Type 2 diabetes mellitus without complication, without long-term current use of insulin (HCC)  At risk for heart disease  Class 3 severe obesity with serious comorbidity and body mass index (BMI) of 45.0 to 49.9 in adult, unspecified obesity type (HCC)  PLAN:  Hyperlipidemia Stacey Clark was informed of  the American Heart Association Guidelines emphasizing intensive lifestyle modifications as the first line treatment for hyperlipidemia. We discussed many lifestyle modifications today in depth, and Stacey Clark will continue to work on decreasing saturated fats such as fatty red meat, butter and many fried foods. She will also increase vegetables and lean protein in her diet and continue to work on diet, exercise, and weight loss efforts. Stacey Clark agrees to follow up with our clinic in 2 weeks.  Diabetes II Stacey Clark has been given extensive diabetes education by myself today including ideal fasting and post-prandial blood glucose readings, individual ideal Hgb A1c goals and hypoglycemia prevention. We discussed the importance of good blood sugar control to decrease the likelihood of diabetic complications such as nephropathy, neuropathy, limb loss, blindness, coronary artery disease, and death. We discussed the importance of intensive lifestyle modification including diet, exercise and weight loss as the first line treatment for diabetes. Stacey Clark agrees to continue her diabetes medications and she agrees to follow up with our clinic in 2 weeks.  Cardiovascular risk counselling Stacey Clark was given extended (15 minutes) coronary artery disease prevention counseling today. She is 47 y.o. female and has risk factors for heart disease including obesity, hyperlipidemia, and diabetes II. We  discussed intensive lifestyle modifications today with an emphasis on specific weight loss instructions and strategies. Pt was also informed of the importance of increasing exercise and decreasing saturated fats to help prevent heart disease.  Obesity Stacey Clark is currently in the action stage of change. As such, her goal is to continue with weight loss efforts She has agreed to keep a food journal with 1500 calories and 100 grams of protein daily Stacey Clark has been instructed to work up to a goal of 150 minutes of combined cardio and strengthening exercise per week for weight loss and overall health benefits. We discussed the following Behavioral Modification Strategies today: increasing lean protein intake and keep a strict food journal   Stacey Clark has agreed to follow up with our clinic in 2 weeks. She was informed of the importance of frequent follow up visits to maximize her success with intensive lifestyle modifications for her multiple health conditions.   OBESITY BEHAVIORAL INTERVENTION VISIT  Today's visit was # 4 out of 22.  Starting weight: 277 lbs Starting date: 05/14/18 Today's weight : 264 lbs Today's date: 07/01/2018 Total lbs lost to date: 13 (Patients must lose 7 lbs in the first 6 months to continue with counseling)   ASK: We discussed the diagnosis of obesity with Stacey Clark today and Stacey Clark agreed to give [place] permission to discuss obesity behavioral modification therapy today.  ASSESS: Stacey Clark has the diagnosis of obesity and her BMI today is 49.91 Stacey Clark is in the action stage of change   ADVISE: Stacey Clark was educated on the multiple health risks of obesity as well as the benefit of weight loss to improve her health. She was advised of the need for long term treatment and the importance of lifestyle modifications.  AGREE: Multiple dietary modification options and treatment options were discussed and  Stacey Clark agreed to the above obesity treatment plan.   Wilhemena Durie, am  acting as transcriptionist for Lacy Duverney, PA-C I, [facility], have reviewed this note and agree with its content

## 2018-07-22 ENCOUNTER — Ambulatory Visit (INDEPENDENT_AMBULATORY_CARE_PROVIDER_SITE_OTHER): Payer: 59 | Admitting: Family Medicine

## 2018-07-22 ENCOUNTER — Encounter: Payer: Self-pay | Admitting: Family Medicine

## 2018-07-22 ENCOUNTER — Ambulatory Visit (INDEPENDENT_AMBULATORY_CARE_PROVIDER_SITE_OTHER): Payer: Self-pay | Admitting: Family Medicine

## 2018-07-22 VITALS — BP 112/70 | HR 90 | Temp 98.0°F | Wt 266.2 lb

## 2018-07-22 VITALS — BP 112/73 | HR 80 | Temp 97.9°F | Ht 61.0 in | Wt 262.0 lb

## 2018-07-22 DIAGNOSIS — K219 Gastro-esophageal reflux disease without esophagitis: Secondary | ICD-10-CM | POA: Diagnosis not present

## 2018-07-22 DIAGNOSIS — R1314 Dysphagia, pharyngoesophageal phase: Secondary | ICD-10-CM | POA: Diagnosis not present

## 2018-07-22 DIAGNOSIS — E559 Vitamin D deficiency, unspecified: Secondary | ICD-10-CM

## 2018-07-22 DIAGNOSIS — Z9189 Other specified personal risk factors, not elsewhere classified: Secondary | ICD-10-CM

## 2018-07-22 DIAGNOSIS — Z8371 Family history of colonic polyps: Secondary | ICD-10-CM | POA: Diagnosis not present

## 2018-07-22 DIAGNOSIS — H9201 Otalgia, right ear: Secondary | ICD-10-CM

## 2018-07-22 DIAGNOSIS — H65191 Other acute nonsuppurative otitis media, right ear: Secondary | ICD-10-CM

## 2018-07-22 DIAGNOSIS — Z6841 Body Mass Index (BMI) 40.0 and over, adult: Secondary | ICD-10-CM

## 2018-07-22 DIAGNOSIS — J019 Acute sinusitis, unspecified: Secondary | ICD-10-CM

## 2018-07-22 MED ORDER — VITAMIN D (ERGOCALCIFEROL) 1.25 MG (50000 UNIT) PO CAPS: 50000.0000 [IU] | ORAL_CAPSULE | ORAL | 0 refills | Status: DC

## 2018-07-22 MED ORDER — DOXYCYCLINE HYCLATE 100 MG PO TABS: 100.0000 mg | ORAL_TABLET | Freq: Two times a day (BID) | ORAL | 0 refills | Status: DC

## 2018-07-22 MED ORDER — AZELASTINE HCL 0.1 % NA SOLN: 2.0000 | Freq: Two times a day (BID) | NASAL | 0 refills | Status: DC

## 2018-07-22 MED FILL — PANTOPRAZOLE SOD DR 40 MG T: 40 | 30 days supply | Qty: 30 | Fill #0

## 2018-07-22 MED FILL — TRULICITY 0.75 MG/0.5 ML PE: 0.75 | 28 days supply | Qty: 2 | Fill #2

## 2018-07-22 MED FILL — VIT D2 1.25 MG (50,000 UNIT: 1.25 MG | 28 days supply | Qty: 4 | Fill #0

## 2018-07-22 MED FILL — DOXYCYCLINE HYCLATE 100 MG: 100 | 10 days supply | Qty: 20 | Fill #0

## 2018-07-22 MED FILL — AZELASTINE HCL 137 MCG SPRY: 0.1 | 25 days supply | Qty: 30 | Fill #0

## 2018-07-22 NOTE — Progress Notes (Signed)
Office: [phone]  /  Fax: [phone]   HPI:   Chief Complaint: OBESITY Stacey Clark is here to discuss her progress with her obesity treatment plan. She is on the keep a food journal with 1500 calories and 100 grams of protein daily and is following her eating plan approximately 90 % of the time. She states she is walking on the beach for 30 minutes 2 times per week. Stacey Clark continues to do well with weight loss. She was was even on vacation and did well with eating mindfully. She is getting good support from husband.  Her weight is 262 lb (118.8 kg) today and has had a weight loss of 2 pounds over a period of 3 weeks since her last visit. She has lost 15 lbs since starting treatment with [place].  Vitamin D Deficiency Stacey Clark has a diagnosis of vitamin D deficiency. She is stable on prescription Vit D, not yet at goal. She denies nausea, vomiting or muscle weakness.  At risk for osteopenia and osteoporosis Stacey Clark is at higher risk of osteopenia and osteoporosis due to vitamin D deficiency.   ALLERGIES: Allergies  Allergen Reactions  . Cephalexin Swelling  . Hydrocodone Nausea And Vomiting  . Rifampin Nausea And Vomiting, Rash and Other (See Comments)    Nausea, Vomiting  . Sulfa Antibiotics Hives  . Augmentin [Amoxicillin-Pot Clavulanate] Nausea And Vomiting  . Metformin And Related Other (See Comments)    GI upset  . Penicillins Swelling    MEDICATIONS: Current Outpatient Medications on File Prior to Visit  Medication Sig Dispense Refill  . ALPRAZolam (XANAX) 0.5 MG tablet Take 0.5 mg by mouth at bedtime as needed.    Marland Kitchen buPROPion (WELLBUTRIN SR) 150 MG 12 hr tablet Take 150 mg by mouth 2 (two) times daily.    . Dulaglutide (TRULICITY) 1.5 EZ/6.6QH SOPN Inject 1.5 mg into the skin once a week.    . escitalopram (LEXAPRO) 5 MG tablet Take 5 mg by mouth daily.     No current facility-administered medications on file prior to visit.     PAST MEDICAL HISTORY: Past Medical History:    Diagnosis Date  . Anxiety   . Asthma   . Back pain   . Depression   . Diabetes mellitus without complication ([place])   . GERD (gastroesophageal reflux disease)   . HLD (hyperlipidemia)   . IBS (irritable bowel syndrome)   . Joint pain   . Obesity   . Sleep apnea     PAST SURGICAL HISTORY: Past Surgical History:  Procedure Laterality Date  . CESAREAN SECTION    . TONSILLECTOMY      SOCIAL HISTORY: Social History   Tobacco Use  . Smoking status: Former Smoker    Last attempt to quit: 12/30/2006    Years since quitting: 11.5  . Smokeless tobacco: Never Used  Substance Use Topics  . Alcohol use: No    Alcohol/week: 0.0 oz  . Drug use: No    FAMILY HISTORY: Family History  Problem Relation Age of Onset  . Hypertension Mother   . Cancer Mother   . Depression Mother   . Anxiety disorder Mother   . Sleep apnea Mother   . Obesity Mother   . Hyperlipidemia Father   . Cancer Father   . Depression Father   . Anxiety disorder Father   . Bipolar disorder Father   . Alcoholism Father   . Obesity Father   . Thyroid disease Neg Hx     ROS: Review  of Systems  Constitutional: Positive for weight loss.  Gastrointestinal: Negative for nausea and vomiting.  Musculoskeletal:       Negative muscle weakness    PHYSICAL EXAM: Blood pressure 112/73, pulse 80, temperature 97.9 F (36.6 C), temperature source Oral, height 5\' 1"  (1.549 m), weight 262 lb (118.8 kg), SpO2 96 %. Body mass index is 49.5 kg/m. Physical Exam  Constitutional: She is oriented to person, place, and time. She appears well-developed and well-nourished.  Cardiovascular: Normal rate.  Pulmonary/Chest: Effort normal.  Musculoskeletal: Normal range of motion.  Neurological: She is oriented to person, place, and time.  Skin: Skin is warm and dry.  Psychiatric: She has a normal mood and affect. Her behavior is normal.  Vitals reviewed.   RECENT LABS AND TESTS: BMET    Component Value Date/Time   NA  137 05/14/2018 0919   K 4.4 05/14/2018 0919   CL 101 05/14/2018 0919   CO2 22 05/14/2018 0919   GLUCOSE 85 05/14/2018 0919   BUN 9 05/14/2018 0919   CREATININE 0.75 05/14/2018 0919   CALCIUM 9.3 05/14/2018 0919   GFRNONAA 95 05/14/2018 0919   GFRAA 109 05/14/2018 0919   Lab Results  Component Value Date   HGBA1C 6.8 (H) 05/14/2018   Lab Results  Component Value Date   INSULIN 41.6 (H) 05/14/2018   CBC    Component Value Date/Time   WBC 10.0 05/14/2018 0919   WBC 13.3 (H) 01/15/2012 1237   RBC 4.69 05/14/2018 0919   RBC 4.90 01/15/2012 1237   HGB 13.2 05/14/2018 0919   HCT 39.8 05/14/2018 0919   PLT 326 01/15/2012 1237   MCV 85 05/14/2018 0919   MCH 28.1 05/14/2018 0919   MCH 29.6 01/15/2012 1237   MCHC 33.2 05/14/2018 0919   MCHC 34.0 01/15/2012 1237   RDW 14.4 05/14/2018 0919   LYMPHSABS 2.3 05/14/2018 0919   MONOABS 0.7 01/15/2012 1237   EOSABS 0.2 05/14/2018 0919   BASOSABS 0.1 05/14/2018 0919   Iron/TIBC/Ferritin/ %Sat No results found for: IRON, TIBC, FERRITIN, IRONPCTSAT Lipid Panel     Component Value Date/Time   CHOL 200 (H) 05/14/2018 0919   TRIG 125 05/14/2018 0919   HDL 46 05/14/2018 0919   LDLCALC 129 (H) 05/14/2018 0919   Hepatic Function Panel     Component Value Date/Time   PROT 6.6 05/14/2018 0919   ALBUMIN 3.9 05/14/2018 0919   AST 19 05/14/2018 0919   ALT 15 05/14/2018 0919   ALKPHOS 93 05/14/2018 0919   BILITOT <0.2 05/14/2018 0919      Component Value Date/Time   TSH 2.360 05/14/2018 0919  Results for JENNIFIER, SMITHERMAN (MRN [id number]) as of 07/22/2018 13:44  Ref. Range 05/14/2018 09:19  Vitamin D, 25-Hydroxy Latest Ref Range: 30.0 - 100.0 ng/mL 30.9    ASSESSMENT AND PLAN: Vitamin D deficiency - Plan: Vitamin D, Ergocalciferol, (DRISDOL) 50000 units CAPS capsule  At risk for osteoporosis  Class 3 severe obesity with serious comorbidity and body mass index (BMI) of 45.0 to 49.9 in adult, unspecified obesity type  ([place])  PLAN:  Vitamin D Deficiency Stacey Clark was informed that low vitamin D levels contributes to fatigue and are associated with obesity, breast, and colon cancer. Stacey Clark agrees to continue taking prescription Vit D @50 ,000 IU every week #4 and we will refill for 1 month. She will follow up for routine testing of vitamin D, at least 2-3 times per year. She was informed of the risk of over-replacement of vitamin D  and agrees to not increase her dose unless she discusses this with [place] first. Stacey Clark agrees to follow up with our clinic in 2 to 3 weeks.  At risk for osteopenia and osteoporosis Stacey Clark is at risk for osteopenia and osteoporsis due to her vitamin D deficiency. She was encouraged to take her vitamin D and follow her higher calcium diet and increase strengthening exercise to help strengthen her bones and decrease her risk of osteopenia and osteoporosis.  Obesity Stacey Clark is currently in the action stage of change. As such, her goal is to continue with weight loss efforts She has agreed to keep a food journal with 1500 calories and 100 grams of protein daily Stacey Clark has been instructed to work up to a goal of 150 minutes of combined cardio and strengthening exercise per week or start cardio group classes for weight loss and overall health benefits. We discussed the following Behavioral Modification Strategies today: increasing lean protein intake, work on meal planning and easy cooking plans and dealing with family or coworker sabotage   Stacey Clark has agreed to follow up with our clinic in 2 to 3 weeks. She was informed of the importance of frequent follow up visits to maximize her success with intensive lifestyle modifications for her multiple health conditions.   OBESITY BEHAVIORAL INTERVENTION VISIT  Today's visit was # 5 out of 22.  Starting weight: 277 lbs Starting date: 05/14/18 Today's weight : 262 lbs Today's date: 07/22/2018 Total lbs lost to date: 15    ASK: We discussed the  diagnosis of obesity with Stacey Clark today and Stacey Clark agreed to give [place] permission to discuss obesity behavioral modification therapy today.  ASSESS: Stacey Clark has the diagnosis of obesity and her BMI today is 49.53 Stacey Clark is in the action stage of change   ADVISE: Stacey Clark was educated on the multiple health risks of obesity as well as the benefit of weight loss to improve her health. She was advised of the need for long term treatment and the importance of lifestyle modifications.  AGREE: Multiple dietary modification options and treatment options were discussed and  Stacey Clark agreed to the above obesity treatment plan.  I, Trixie Dredge, am acting as transcriptionist for Dennard Nip, MD  I have reviewed the above documentation for accuracy and completeness, and I agree with the above. -Dennard Nip, MD

## 2018-07-22 NOTE — Progress Notes (Signed)
Stacey Clark is a 47 y.o. female who presents today with concerns of sinus pain, congestion, and ear pain x 4 days. She has attempted to use an OTC allergy tablet with little relief. She is under the care of her PCP for chronic health conditions of Type 2 diabetes and has multiple allergies to medications. She denies any other treatment up to this point or similar illness in the last 30 days.  Review of Systems  Constitutional: Negative for chills, fever and malaise/fatigue.  HENT: Positive for ear pain and sore throat. Negative for congestion, ear discharge and sinus pain.   Eyes: Negative.   Respiratory: Negative for cough, sputum production and shortness of breath.   Cardiovascular: Negative.  Negative for chest pain.  Gastrointestinal: Negative for abdominal pain, diarrhea, nausea and vomiting.  Genitourinary: Negative for dysuria, frequency, hematuria and urgency.  Musculoskeletal: Negative for myalgias.  Skin: Negative.   Neurological: Negative for headaches.  Endo/Heme/Allergies: Negative.   Psychiatric/Behavioral: Negative.     O: Vitals:   07/22/18 1200  BP: 112/70  Pulse: 90  Temp: 98 F (36.7 C)  SpO2: 95%     Physical Exam  Constitutional: She is oriented to person, place, and time. Vital signs are normal. She appears well-developed and well-nourished. She is active.  Non-toxic appearance. She does not have a sickly appearance.  HENT:  Head: Normocephalic.  Right Ear: Hearing, external ear and ear canal normal. Tympanic membrane is erythematous. A middle ear effusion is present.  Left Ear: Hearing, tympanic membrane, external ear and ear canal normal.  Nose: Rhinorrhea present.  Mouth/Throat: Uvula is midline and oropharynx is clear and moist.  Neck: Normal range of motion. Neck supple.  Cardiovascular: Normal rate, regular rhythm, normal heart sounds and normal pulses.  Pulmonary/Chest: Effort normal and breath sounds normal.  Abdominal: Soft. Bowel sounds are  normal.  Musculoskeletal: Normal range of motion.  Lymphadenopathy:       Head (right side): No submental and no submandibular adenopathy present.       Head (left side): No submental and no submandibular adenopathy present.    She has no cervical adenopathy.  Neurological: She is alert and oriented to person, place, and time.  Psychiatric: She has a normal mood and affect.  Vitals reviewed.    A: 1. Acute sinusitis, recurrence not specified, unspecified location   2. Right ear pain   3. Acute effusion of right ear      P: Exam findings, diagnosis etiology and medication use and indications reviewed with patient. Follow- Up and discharge instructions provided. No emergent/urgent issues found on exam.  Patient verbalized understanding of information provided and agrees with plan of care (POC), all questions answered.  1. Acute sinusitis, recurrence not specified, unspecified location - doxycycline (VIBRA-TABS) 100 MG tablet; Take 1 tablet (100 mg total) by mouth 2 (two) times daily. - azelastine (ASTELIN) 0.1 % nasal spray; Place 2 sprays into both nostrils 2 (two) times daily. Use in each nostril as directed  2. Right ear pain  3. Acute effusion of right ear

## 2018-07-22 NOTE — Patient Instructions (Signed)

## 2018-07-24 ENCOUNTER — Telehealth: Payer: Self-pay | Admitting: Emergency Medicine

## 2018-07-24 ENCOUNTER — Other Ambulatory Visit (HOSPITAL_COMMUNITY): Payer: Self-pay | Admitting: Gastroenterology

## 2018-07-24 DIAGNOSIS — R1310 Dysphagia, unspecified: Secondary | ICD-10-CM

## 2018-07-24 NOTE — Telephone Encounter (Signed)
Called patient to follow up with her since her visit, she stated she is feeling a little better and thanked me for the car

## 2018-07-30 ENCOUNTER — Ambulatory Visit (HOSPITAL_COMMUNITY)
Admission: RE | Admit: 2018-07-30 | Discharge: 2018-07-30 | Disposition: A | Discharge status: Home / Self Care | Payer: 59 | Source: Ambulatory Visit | Attending: Gastroenterology | Admitting: Gastroenterology

## 2018-07-30 DIAGNOSIS — J45909 Unspecified asthma, uncomplicated: Secondary | ICD-10-CM | POA: Diagnosis not present

## 2018-07-30 DIAGNOSIS — R1314 Dysphagia, pharyngoesophageal phase: Secondary | ICD-10-CM | POA: Insufficient documentation

## 2018-07-30 DIAGNOSIS — R1310 Dysphagia, unspecified: Secondary | ICD-10-CM

## 2018-07-30 DIAGNOSIS — E119 Type 2 diabetes mellitus without complications: Secondary | ICD-10-CM | POA: Insufficient documentation

## 2018-07-30 NOTE — Progress Notes (Signed)
Modified Barium Swallow Progress Note  Patient Details  Name: Stacey Clark MRN: [id number] Date of Birth: 1971-01-10  Today's Date: 07/30/2018  Modified Barium Swallow completed.  Full report located under Chart Review in the Imaging Section.  Brief recommendations include the following:  Clinical Impression  Pt demonstrates normal swallow function. Esophageal sweep appeared WNL. Reviewed basic aspiration and esophageal precautions. No SLP f/u needed.    Swallow Evaluation Recommendations       SLP Diet Recommendations: Regular solids;Thin liquid   Liquid Administration via: Cup;Straw   Medication Administration: Whole meds with liquid   Supervision: Patient able to self feed                    Michaelle Bottomley, Katherene Ponto 07/30/2018,11:51 AM

## 2018-07-31 DIAGNOSIS — J0100 Acute maxillary sinusitis, unspecified: Secondary | ICD-10-CM | POA: Diagnosis not present

## 2018-07-31 DIAGNOSIS — J4531 Mild persistent asthma with (acute) exacerbation: Secondary | ICD-10-CM | POA: Diagnosis not present

## 2018-07-31 DIAGNOSIS — R05 Cough: Secondary | ICD-10-CM | POA: Diagnosis not present

## 2018-07-31 MED FILL — CLARITHROMYCIN 500 MG TAB: 500 | 14 days supply | Qty: 28 | Fill #0

## 2018-07-31 MED FILL — SYMBICORT 160-4.5 MCG INH: 160-4.5 | 30 days supply | Qty: 10 | Fill #0

## 2018-08-05 MED FILL — FREESTYLE LANCETS: 90 days supply | Qty: 100 | Fill #0

## 2018-08-05 MED FILL — FREESTYLE LITE TEST STRIP: 90 days supply | Qty: 100 | Fill #0

## 2018-08-12 ENCOUNTER — Ambulatory Visit (INDEPENDENT_AMBULATORY_CARE_PROVIDER_SITE_OTHER): Payer: 59 | Admitting: Family Medicine

## 2018-08-12 VITALS — BP 104/68 | HR 64 | Temp 98.2°F | Ht 61.0 in | Wt 252.0 lb

## 2018-08-12 DIAGNOSIS — Z6841 Body Mass Index (BMI) 40.0 and over, adult: Secondary | ICD-10-CM | POA: Diagnosis not present

## 2018-08-12 DIAGNOSIS — E559 Vitamin D deficiency, unspecified: Secondary | ICD-10-CM | POA: Diagnosis not present

## 2018-08-12 DIAGNOSIS — Z9189 Other specified personal risk factors, not elsewhere classified: Secondary | ICD-10-CM | POA: Diagnosis not present

## 2018-08-12 MED ORDER — VITAMIN D (ERGOCALCIFEROL) 1.25 MG (50000 UNIT) PO CAPS: 50000.0000 [IU] | ORAL_CAPSULE | ORAL | 0 refills | Status: DC

## 2018-08-12 NOTE — Progress Notes (Signed)
Office: [phone]  /  Fax: [phone]   HPI:   Chief Complaint: OBESITY Stacey Clark is here to discuss her progress with her obesity treatment plan. She is on the keep a food journal with 1500 calories and 100 grams of protein daily and is following her eating plan approximately 97 % of the time. She states she is doing a little walking. Stacey Clark has done very well with weight loss but has been sick for 2 weeks. Her weight loss is faster than ideal but understandable while not feeling well. She has gotten back to her eating plan and is feeling better.  Her weight is 252 lb (114.3 kg) today and has had a weight loss of 14 pounds over a period of 3 weeks since her last visit. She has lost 25 lbs since starting treatment with [place].  Vitamin D Deficiency Stacey Clark has a diagnosis of vitamin D deficiency. She is stable on prescription Vit D, not yet at goal. She denies nausea, vomiting or muscle weakness.  Diabetes II Stacey Clark has a diagnosis of diabetes type II. Stacey Clark's last D1S was 6.8 on Trulicity. She denies nausea, vomiting, or hypoglycemia and notes decreased polyphagia. She has been working on intensive lifestyle modifications including diet, exercise, and weight loss to help control her blood glucose levels.  At risk for cardiovascular disease Stacey Clark is at a higher than average risk for cardiovascular disease due to obesity and diabetes II. She currently denies any chest pain.  ALLERGIES: Allergies  Allergen Reactions  . Cephalexin Swelling  . Hydrocodone Nausea And Vomiting  . Rifampin Nausea And Vomiting, Rash and Other (See Comments)    Nausea, Vomiting  . Sulfa Antibiotics Hives  . Augmentin [Amoxicillin-Pot Clavulanate] Nausea And Vomiting  . Metformin And Related Other (See Comments)    GI upset  . Penicillins Swelling    MEDICATIONS: Current Outpatient Medications on File Prior to Visit  Medication Sig Dispense Refill  . ALPRAZolam (XANAX) 0.5 MG tablet Take 0.5 mg by mouth at  bedtime as needed.    Marland Kitchen azelastine (ASTELIN) 0.1 % nasal spray Place 2 sprays into both nostrils 2 (two) times daily. Use in each nostril as directed 30 mL 0  . buPROPion (WELLBUTRIN SR) 150 MG 12 hr tablet Take 150 mg by mouth 2 (two) times daily.    Marland Kitchen doxycycline (VIBRA-TABS) 100 MG tablet Take 1 tablet (100 mg total) by mouth 2 (two) times daily. 20 tablet 0  . Dulaglutide (TRULICITY) 1.5 HF/0.2OV SOPN Inject 1.5 mg into the skin once a week.    . escitalopram (LEXAPRO) 5 MG tablet Take 5 mg by mouth daily.    . Vitamin D, Ergocalciferol, (DRISDOL) 50000 units CAPS capsule Take 1 capsule (50,000 Units total) by mouth every 7 (seven) days. 4 capsule 0   No current facility-administered medications on file prior to visit.     PAST MEDICAL HISTORY: Past Medical History:  Diagnosis Date  . Anxiety   . Asthma   . Back pain   . Depression   . Diabetes mellitus without complication ([place])   . GERD (gastroesophageal reflux disease)   . HLD (hyperlipidemia)   . IBS (irritable bowel syndrome)   . Joint pain   . Obesity   . Sleep apnea     PAST SURGICAL HISTORY: Past Surgical History:  Procedure Laterality Date  . CESAREAN SECTION    . TONSILLECTOMY      SOCIAL HISTORY: Social History   Tobacco Use  . Smoking status: Former Smoker  Last attempt to quit: 12/30/2006    Years since quitting: 11.6  . Smokeless tobacco: Never Used  Substance Use Topics  . Alcohol use: No    Alcohol/week: 0.0 standard drinks  . Drug use: No    FAMILY HISTORY: Family History  Problem Relation Age of Onset  . Hypertension Mother   . Cancer Mother   . Depression Mother   . Anxiety disorder Mother   . Sleep apnea Mother   . Obesity Mother   . Hyperlipidemia Father   . Cancer Father   . Depression Father   . Anxiety disorder Father   . Bipolar disorder Father   . Alcoholism Father   . Obesity Father   . Thyroid disease Neg Hx     ROS: Review of Systems  Constitutional: Positive for  weight loss.  Cardiovascular: Negative for chest pain.  Gastrointestinal: Negative for nausea and vomiting.  Musculoskeletal:       Negative muscle weakness  Endo/Heme/Allergies:       Negative hypoglycemia Positive polyphagia    PHYSICAL EXAM: Blood pressure 104/68, pulse 64, temperature 98.2 F (36.8 C), temperature source Oral, height 5\' 1"  (1.549 m), weight 252 lb (114.3 kg), SpO2 95 %. Body mass index is 47.61 kg/m. Physical Exam  Constitutional: She is oriented to person, place, and time. She appears well-developed and well-nourished.  Cardiovascular: Normal rate.  Pulmonary/Chest: Effort normal.  Musculoskeletal: Normal range of motion.  Neurological: She is oriented to person, place, and time.  Skin: Skin is warm and dry.  Psychiatric: She has a normal mood and affect. Her behavior is normal.  Vitals reviewed.   RECENT LABS AND TESTS: BMET    Component Value Date/Time   NA 137 05/14/2018 0919   K 4.4 05/14/2018 0919   CL 101 05/14/2018 0919   CO2 22 05/14/2018 0919   GLUCOSE 85 05/14/2018 0919   BUN 9 05/14/2018 0919   CREATININE 0.75 05/14/2018 0919   CALCIUM 9.3 05/14/2018 0919   GFRNONAA 95 05/14/2018 0919   GFRAA 109 05/14/2018 0919   Lab Results  Component Value Date   HGBA1C 6.8 (H) 05/14/2018   Lab Results  Component Value Date   INSULIN 41.6 (H) 05/14/2018   CBC    Component Value Date/Time   WBC 10.0 05/14/2018 0919   WBC 13.3 (H) 01/15/2012 1237   RBC 4.69 05/14/2018 0919   RBC 4.90 01/15/2012 1237   HGB 13.2 05/14/2018 0919   HCT 39.8 05/14/2018 0919   PLT 326 01/15/2012 1237   MCV 85 05/14/2018 0919   MCH 28.1 05/14/2018 0919   MCH 29.6 01/15/2012 1237   MCHC 33.2 05/14/2018 0919   MCHC 34.0 01/15/2012 1237   RDW 14.4 05/14/2018 0919   LYMPHSABS 2.3 05/14/2018 0919   MONOABS 0.7 01/15/2012 1237   EOSABS 0.2 05/14/2018 0919   BASOSABS 0.1 05/14/2018 0919   Iron/TIBC/Ferritin/ %Sat No results found for: IRON, TIBC, FERRITIN,  IRONPCTSAT Lipid Panel     Component Value Date/Time   CHOL 200 (H) 05/14/2018 0919   TRIG 125 05/14/2018 0919   HDL 46 05/14/2018 0919   LDLCALC 129 (H) 05/14/2018 0919   Hepatic Function Panel     Component Value Date/Time   PROT 6.6 05/14/2018 0919   ALBUMIN 3.9 05/14/2018 0919   AST 19 05/14/2018 0919   ALT 15 05/14/2018 0919   ALKPHOS 93 05/14/2018 0919   BILITOT <0.2 05/14/2018 0919      Component Value Date/Time   TSH 2.360  05/14/2018 0919  Results for JULAINE, ZIMNY (MRN [id number]) as of 08/12/2018 11:46  Ref. Range 05/14/2018 09:19  Vitamin D, 25-Hydroxy Latest Ref Range: 30.0 - 100.0 ng/mL 30.9    ASSESSMENT AND PLAN: Vitamin D deficiency - Plan: Vitamin D, Ergocalciferol, (DRISDOL) 50000 units CAPS capsule  At risk for heart disease  Class 3 severe obesity with serious comorbidity and body mass index (BMI) of 45.0 to 49.9 in adult, unspecified obesity type ([place])  PLAN:  Vitamin D Deficiency Stacey Clark was informed that low vitamin D levels contributes to fatigue and are associated with obesity, breast, and colon cancer. Stacey Clark agrees to continue taking prescription Vit D @50 ,000 IU every week #4 and we will refill for 1 month. She will follow up for routine testing of vitamin D, at least 2-3 times per year. She was informed of the risk of over-replacement of vitamin D and agrees to not increase her dose unless she discusses this with [place] first. Stacey Clark agrees to follow up with our clinic in 2 to 3 weeks and we will check labs at that time.  Diabetes II Stacey Clark has been given extensive diabetes education by myself today including ideal fasting and post-prandial blood glucose readings, individual ideal Hgb A1c goals and hypoglycemia prevention. We discussed the importance of good blood sugar control to decrease the likelihood of diabetic complications such as nephropathy, neuropathy, limb loss, blindness, coronary artery disease, and death. We discussed the importance of  intensive lifestyle modification including diet, exercise and weight loss as the first line treatment for diabetes. Stacey Clark agrees to continue her diabetes medications, diet, and exercise. Stacey Clark agrees to follow up with our clinic in 2 to 3 weeks and we will check labs at that time.  Cardiovascular risk counselling Stacey Clark was given extended (15 minutes) coronary artery disease prevention counseling today. She is 47 y.o. female and has risk factors for heart disease including obesity and diabetes II. We discussed intensive lifestyle modifications today with an emphasis on specific weight loss instructions and strategies. Pt was also informed of the importance of increasing exercise and decreasing saturated fats to help prevent heart disease.  Stacey Clark is currently in the action stage of change. As such, her goal is to continue with weight loss efforts She has agreed to follow the Category 3 plan Stacey Clark has been instructed to work up to a goal of 150 minutes of combined cardio and strengthening exercise per week for weight loss and overall health benefits. We discussed the following Behavioral Modification Strategies today: increasing lean protein intake, decreasing simple carbohydrates, work on meal planning and easy cooking plans, and no skipping meals   Stacey Clark has agreed to follow up with our clinic in 2 to 3 weeks. She was informed of the importance of frequent follow up visits to maximize her success with intensive lifestyle modifications for her multiple health conditions.   OBESITY BEHAVIORAL INTERVENTION VISIT  Today's visit was # 6 out of 22.  Starting weight: 277 lbs Starting date: 05/14/18 Today's weight : 252 lbs  Today's date: 08/12/2018 Total lbs lost to date: 25    ASK: We discussed the diagnosis of obesity with Stacey Clark today and Xan agreed to give [place] permission to discuss obesity behavioral modification therapy today.  ASSESS: Stacey Clark has the diagnosis of obesity and her BMI  today is 47.64 Stacey Clark is in the action stage of change   ADVISE: Stacey Clark was educated on the multiple health risks of obesity as well as the benefit of weight  loss to improve her health. She was advised of the need for long term treatment and the importance of lifestyle modifications.  AGREE: Multiple dietary modification options and treatment options were discussed and  Stacey Clark agreed to the above obesity treatment plan.  I, Trixie Dredge, am acting as transcriptionist for Stacey Nip, MD  I have reviewed the above documentation for accuracy and completeness, and I agree with the above. -Stacey Nip, MD

## 2018-08-14 DIAGNOSIS — R1310 Dysphagia, unspecified: Secondary | ICD-10-CM | POA: Diagnosis not present

## 2018-08-14 DIAGNOSIS — K2930 Chronic superficial gastritis without bleeding: Secondary | ICD-10-CM | POA: Diagnosis not present

## 2018-08-14 DIAGNOSIS — K317 Polyp of stomach and duodenum: Secondary | ICD-10-CM | POA: Diagnosis not present

## 2018-08-20 MED FILL — TRULICITY 0.75 MG/0.5 ML PE: 0.75 | 28 days supply | Qty: 2 | Fill #3

## 2018-08-20 MED FILL — PANTOPRAZOLE SOD DR 40 MG T: 40 | 30 days supply | Qty: 30 | Fill #1

## 2018-08-24 MED FILL — VIT D2 1.25 MG (50,000 UNIT: 1.25 MG | 28 days supply | Qty: 4 | Fill #0

## 2018-09-02 ENCOUNTER — Ambulatory Visit (INDEPENDENT_AMBULATORY_CARE_PROVIDER_SITE_OTHER): Payer: 59 | Admitting: Bariatrics

## 2018-09-02 ENCOUNTER — Encounter (INDEPENDENT_AMBULATORY_CARE_PROVIDER_SITE_OTHER): Payer: Self-pay | Admitting: Bariatrics

## 2018-09-02 VITALS — BP 137/73 | HR 63 | Temp 98.2°F | Ht 61.0 in | Wt 253.0 lb

## 2018-09-02 DIAGNOSIS — Z6841 Body Mass Index (BMI) 40.0 and over, adult: Secondary | ICD-10-CM | POA: Diagnosis not present

## 2018-09-02 DIAGNOSIS — E559 Vitamin D deficiency, unspecified: Secondary | ICD-10-CM | POA: Diagnosis not present

## 2018-09-02 DIAGNOSIS — Z9189 Other specified personal risk factors, not elsewhere classified: Secondary | ICD-10-CM

## 2018-09-02 DIAGNOSIS — E119 Type 2 diabetes mellitus without complications: Secondary | ICD-10-CM | POA: Diagnosis not present

## 2018-09-03 ENCOUNTER — Encounter (INDEPENDENT_AMBULATORY_CARE_PROVIDER_SITE_OTHER): Payer: Self-pay | Admitting: Bariatrics

## 2018-09-03 LAB — LIPID PANEL WITH LDL/HDL RATIO
Cholesterol, Total: 182 mg/dL (ref 100–199)
HDL: 51 mg/dL (ref >39)
LDL Calculated: 111 mg/dL — ABNORMAL HIGH (ref 0–99)
LDL/HDL RATIO: 2.2 ratio (ref 0.0–3.2)
TRIGLYCERIDES: 100 mg/dL (ref 0–149)
VLDL Cholesterol Cal: 20 mg/dL (ref 5–40)

## 2018-09-03 LAB — COMPREHENSIVE METABOLIC PANEL
ALK PHOS: 78 IU/L (ref 39–117)
ALT: 11 IU/L (ref 0–32)
AST: 11 IU/L (ref 0–40)
Albumin/Globulin Ratio: 1.5 (ref 1.2–2.2)
Albumin: 4 g/dL (ref 3.5–5.5)
BUN/Creatinine Ratio: 17 (ref 9–23)
BUN: 11 mg/dL (ref 6–24)
Bilirubin Total: 0.3 mg/dL (ref 0.0–1.2)
CALCIUM: 9.1 mg/dL (ref 8.7–10.2)
CO2: 24 mmol/L (ref 20–29)
CREATININE: 0.64 mg/dL (ref 0.57–1.00)
Chloride: 99 mmol/L (ref 96–106)
GFR calc Af Amer: 122 mL/min/{1.73_m2} (ref >59)
GFR, EST NON AFRICAN AMERICAN: 106 mL/min/{1.73_m2} (ref >59)
Globulin, Total: 2.6 g/dL (ref 1.5–4.5)
Glucose: 84 mg/dL (ref 65–99)
POTASSIUM: 4.5 mmol/L (ref 3.5–5.2)
Sodium: 138 mmol/L (ref 134–144)
Total Protein: 6.6 g/dL (ref 6.0–8.5)

## 2018-09-03 LAB — HEMOGLOBIN A1C
ESTIMATED AVERAGE GLUCOSE: 134 mg/dL
HEMOGLOBIN A1C: 6.3 % — AB (ref 4.8–5.6)

## 2018-09-03 LAB — VITAMIN D 25 HYDROXY (VIT D DEFICIENCY, FRACTURES): VIT D 25 HYDROXY: 37.2 ng/mL (ref 30.0–100.0)

## 2018-09-03 LAB — INSULIN, RANDOM: INSULIN: 20 u[IU]/mL (ref 2.6–24.9)

## 2018-09-03 NOTE — Progress Notes (Signed)
Office: [phone]  /  Fax: [phone]   HPI:   Chief Complaint: OBESITY Stacey Clark is here to discuss her progress with her obesity treatment plan. She is on the Category 3 plan and is following her eating plan approximately 75 % of the time. She states she is walking for 30 minutes 5 times per week. [name] went on vacation and ate a few more carbohydrates. She denies struggles on Category 3 plan. She is not getting enough protein at night.  Her weight is 253 lb (114.8 kg) today and has gained 1 pound since her last visit. She has lost 24 lbs since starting treatment with Stacey Clark.  Vitamin D Deficiency Stacey Clark has a diagnosis of vitamin D deficiency. She is currently taking prescription Vit D and denies nausea, vomiting or muscle weakness.  Diabetes II Ashelynn has a diagnosis of diabetes type II. Takela is on Trulicity 9.37 mg per patient, and she states fasting BGs range between 84 and 87 and post prandial range in 80's. She denies any hypoglycemic episodes. Last A1c was 6.8. She has been working on intensive lifestyle modifications including diet, exercise, and weight loss to help control her blood glucose levels.  At risk for cardiovascular disease [name] is at a higher than average risk for cardiovascular disease due to obesity and diabetes II. She currently denies any chest pain.  ALLERGIES: Allergies  Allergen Reactions  . Cephalexin Swelling  . Hydrocodone Nausea And Vomiting  . Rifampin Nausea And Vomiting, Rash and Other (See Comments)    Nausea, Vomiting  . Sulfa Antibiotics Hives  . Augmentin [Amoxicillin-Pot Clavulanate] Nausea And Vomiting  . Metformin And Related Other (See Comments)    GI upset  . Penicillins Swelling    MEDICATIONS: Current Outpatient Medications on File Prior to Visit  Medication Sig Dispense Refill  . ALPRAZolam (XANAX) 0.5 MG tablet Take 0.5 mg by mouth at bedtime as needed.    Marland Kitchen buPROPion (WELLBUTRIN SR) 150 MG 12 hr tablet Take 150 mg by mouth 2  (two) times daily.    . Dulaglutide (TRULICITY) 1.5 JI/9.6VE SOPN Inject 1.5 mg into the skin once a week.    . escitalopram (LEXAPRO) 5 MG tablet Take 5 mg by mouth daily.    . pantoprazole (PROTONIX) 20 MG tablet Take 20 mg by mouth daily.    . Vitamin D, Ergocalciferol, (DRISDOL) 50000 units CAPS capsule Take 1 capsule (50,000 Units total) by mouth every 7 (seven) days. 4 capsule 0   No current facility-administered medications on file prior to visit.     PAST MEDICAL HISTORY: Past Medical History:  Diagnosis Date  . Anxiety   . Asthma   . Back pain   . Depression   . Diabetes mellitus without complication ([place])   . GERD (gastroesophageal reflux disease)   . HLD (hyperlipidemia)   . IBS (irritable bowel syndrome)   . Joint pain   . Obesity   . Sleep apnea     PAST SURGICAL HISTORY: Past Surgical History:  Procedure Laterality Date  . CESAREAN SECTION    . TONSILLECTOMY      SOCIAL HISTORY: Social History   Tobacco Use  . Smoking status: Former Smoker    Last attempt to quit: 12/30/2006    Years since quitting: 11.6  . Smokeless tobacco: Never Used  Substance Use Topics  . Alcohol use: No    Alcohol/week: 0.0 standard drinks  . Drug use: No    FAMILY HISTORY: Family History  Problem Relation Age  of Onset  . Hypertension Mother   . Cancer Mother   . Depression Mother   . Anxiety disorder Mother   . Sleep apnea Mother   . Obesity Mother   . Hyperlipidemia Father   . Cancer Father   . Depression Father   . Anxiety disorder Father   . Bipolar disorder Father   . Alcoholism Father   . Obesity Father   . Thyroid disease Neg Hx     ROS: Review of Systems  Constitutional: Negative for weight loss.  Cardiovascular: Negative for chest pain.  Gastrointestinal: Negative for nausea and vomiting.  Musculoskeletal:       Negative muscle weakness  Endo/Heme/Allergies:       Negative hypoglycemia Negative polyphagia    PHYSICAL EXAM: Blood pressure 137/73,  pulse 63, temperature 98.2 F (36.8 C), temperature source Oral, height 5\' 1"  (1.549 m), weight 253 lb (114.8 kg), SpO2 98 %. Body mass index is 47.8 kg/m. Physical Exam  Constitutional: She is oriented to person, place, and time. She appears well-developed and well-nourished.  Cardiovascular: Normal rate.  Pulmonary/Chest: Effort normal.  Musculoskeletal: Normal range of motion.  Neurological: She is oriented to person, place, and time.  Skin: Skin is warm and dry.  Psychiatric: She has a normal mood and affect. Her behavior is normal.  Vitals reviewed.   RECENT LABS AND TESTS: BMET    Component Value Date/Time   NA 138 09/02/2018 1036   K 4.5 09/02/2018 1036   CL 99 09/02/2018 1036   CO2 24 09/02/2018 1036   GLUCOSE 84 09/02/2018 1036   BUN 11 09/02/2018 1036   CREATININE 0.64 09/02/2018 1036   CALCIUM 9.1 09/02/2018 1036   GFRNONAA 106 09/02/2018 1036   GFRAA 122 09/02/2018 1036   Lab Results  Component Value Date   HGBA1C 6.3 (H) 09/02/2018   HGBA1C 6.8 (H) 05/14/2018   Lab Results  Component Value Date   INSULIN 20.0 09/02/2018   INSULIN 41.6 (H) 05/14/2018   CBC    Component Value Date/Time   WBC 10.0 05/14/2018 0919   WBC 13.3 (H) 01/15/2012 1237   RBC 4.69 05/14/2018 0919   RBC 4.90 01/15/2012 1237   HGB 13.2 05/14/2018 0919   HCT 39.8 05/14/2018 0919   PLT 326 01/15/2012 1237   MCV 85 05/14/2018 0919   MCH 28.1 05/14/2018 0919   MCH 29.6 01/15/2012 1237   MCHC 33.2 05/14/2018 0919   MCHC 34.0 01/15/2012 1237   RDW 14.4 05/14/2018 0919   LYMPHSABS 2.3 05/14/2018 0919   MONOABS 0.7 01/15/2012 1237   EOSABS 0.2 05/14/2018 0919   BASOSABS 0.1 05/14/2018 0919   Iron/TIBC/Ferritin/ %Sat No results found for: IRON, TIBC, FERRITIN, IRONPCTSAT Lipid Panel     Component Value Date/Time   CHOL 182 09/02/2018 1036   TRIG 100 09/02/2018 1036   HDL 51 09/02/2018 1036   LDLCALC 111 (H) 09/02/2018 1036   Hepatic Function Panel     Component Value  Date/Time   PROT 6.6 09/02/2018 1036   ALBUMIN 4.0 09/02/2018 1036   AST 11 09/02/2018 1036   ALT 11 09/02/2018 1036   ALKPHOS 78 09/02/2018 1036   BILITOT 0.3 09/02/2018 1036      Component Value Date/Time   TSH 2.360 05/14/2018 0919  Results for HAMDI, VARI (MRN [id number]) as of 09/03/2018 13:27  Ref. Range 09/02/2018 10:36  Vitamin D, 25-Hydroxy Latest Ref Range: 30.0 - 100.0 ng/mL 37.2    ASSESSMENT AND PLAN: Vitamin D deficiency -  Plan: VITAMIN D 25 Hydroxy (Vit-D Deficiency, Fractures)  Type 2 diabetes mellitus without complication, without long-term current use of insulin ([facility]) - Plan: Comprehensive metabolic panel, Hemoglobin A1c, Insulin, random, Lipid Panel With LDL/HDL Ratio  At risk for heart disease  Class 3 severe obesity with serious comorbidity and body mass index (BMI) of 45.0 to 49.9 in adult, unspecified obesity type ([place])  PLAN:  Vitamin D Deficiency Desira was informed that low vitamin D levels contributes to fatigue and are associated with obesity, breast, and colon cancer. Carnella agrees to continue taking  prescription Vit D @50 ,000 IU every week and will increase Vit D rich foods. She will follow up for routine testing of vitamin D, at least 2-3 times per year. She was informed of the risk of over-replacement of vitamin D and agrees to not increase her dose unless she discusses this with Stacey Clark first. We will check labs today and Dimitri agrees to follow up with our clinic in 2 weeks with myself or Dr. Leafy Ro.  Diabetes II Tahni has been given extensive diabetes education by myself today including ideal fasting and post-prandial blood glucose readings, individual ideal Hgb A1c goals and hypoglycemia prevention. We discussed the importance of good blood sugar control to decrease the likelihood of diabetic complications such as nephropathy, neuropathy, limb loss, blindness, coronary artery disease, and death. We discussed the importance of intensive lifestyle  modification including diet, exercise and weight loss as the first line treatment for diabetes. Martisha agrees to continue her Trulicity at 4.40 mg and she will continue to monitor her blood sugar daily (fasting and PM) alternating. We will check labs today and Damaria agrees to follow up with our clinic in 2 weeks with myself or Dr. Leafy Ro.  Cardiovascular risk counselling Sanna was given extended (15 minutes) coronary artery disease prevention counseling today. She is 47 y.o. female and has risk factors for heart disease including obesity and diabetes II. We discussed intensive lifestyle modifications today with an emphasis on specific weight loss instructions and strategies. Pt was also informed of the importance of increasing exercise and decreasing saturated fats to help prevent heart disease.  Obesity Safiyya is currently in the action stage of change. As such, her goal is to continue with weight loss efforts She has agreed to follow the Category 3 plan Meela has been instructed to work up to a goal of 150 minutes of combined cardio and strengthening exercise per week or walking everyday for 30 minutes for weight loss and overall health benefits. We discussed the following Behavioral Modification Strategies today: increasing lean protein intake, decreasing simple carbohydrates, increasing vegetables, increase H20 intake, and no skipping meals Sophea will increase meat during the day, yogurt, and Baby bel cheese. She will increase water. Recipes and high protein snack ideas given.  Felipa has agreed to follow up with our clinic in 2 weeks. She was informed of the importance of frequent follow up visits to maximize her success with intensive lifestyle modifications for her multiple health conditions.   OBESITY BEHAVIORAL INTERVENTION VISIT  Today's visit was # 7   Starting weight: 277 lbs Starting date: 05/14/18 Today's weight : 253 lbs  Today's date: 09/02/2018 Total lbs lost to date:  24    ASK: We discussed the diagnosis of obesity with Julieta Gutting today and Desarai agreed to give Stacey Clark permission to discuss obesity behavioral modification therapy today.  ASSESS: Arihanna has the diagnosis of obesity and her BMI today is 47.83 Tyreshia is in the action  stage of change   ADVISE: Sabrinna was educated on the multiple health risks of obesity as well as the benefit of weight loss to improve her health. She was advised of the need for long term treatment and the importance of lifestyle modifications to improve her current health and to decrease her risk of future health problems.  AGREE: Multiple dietary modification options and treatment options were discussed and  Shelita agreed to follow the recommendations documented in the above note.  ARRANGE: Darnette was educated on the importance of frequent visits to treat obesity as outlined per CMS and USPSTF guidelines and agreed to schedule her next follow up appointment today.  Wilhemena Durie, am acting as transcriptionist for [employer], DO  I have reviewed the above documentation for accuracy and completeness, and I agree with the above. -Jearld Lesch, DO

## 2018-09-14 ENCOUNTER — Ambulatory Visit (INDEPENDENT_AMBULATORY_CARE_PROVIDER_SITE_OTHER): Payer: 59 | Admitting: Bariatrics

## 2018-09-14 ENCOUNTER — Encounter (INDEPENDENT_AMBULATORY_CARE_PROVIDER_SITE_OTHER): Payer: Self-pay | Admitting: Bariatrics

## 2018-09-14 VITALS — BP 113/77 | HR 68 | Temp 97.9°F | Ht 61.0 in | Wt 252.0 lb

## 2018-09-14 DIAGNOSIS — E119 Type 2 diabetes mellitus without complications: Secondary | ICD-10-CM | POA: Diagnosis not present

## 2018-09-14 DIAGNOSIS — E559 Vitamin D deficiency, unspecified: Secondary | ICD-10-CM | POA: Diagnosis not present

## 2018-09-14 DIAGNOSIS — Z6841 Body Mass Index (BMI) 40.0 and over, adult: Secondary | ICD-10-CM

## 2018-09-14 DIAGNOSIS — Z9189 Other specified personal risk factors, not elsewhere classified: Secondary | ICD-10-CM

## 2018-09-14 MED ORDER — VITAMIN D (ERGOCALCIFEROL) 1.25 MG (50000 UNIT) PO CAPS: 50000.0000 [IU] | ORAL_CAPSULE | ORAL | 0 refills | Status: DC

## 2018-09-14 MED ORDER — DULAGLUTIDE 1.5 MG/0.5ML ~~LOC~~ SOAJ: 1.5000 mg | SUBCUTANEOUS | 0 refills | Status: DC

## 2018-09-15 ENCOUNTER — Ambulatory Visit (INDEPENDENT_AMBULATORY_CARE_PROVIDER_SITE_OTHER): Payer: 59 | Admitting: Bariatrics

## 2018-09-16 NOTE — Progress Notes (Signed)
Office: [phone]  /  Fax: [phone]   HPI:   Chief Complaint: OBESITY Stacey Clark is here to discuss her progress with her obesity treatment plan. She is on the Category 3 plan and is following her eating plan approximately 90 % of the time. She states she is walking 15 minutes 5 times per week. Stacey Clark is doing ok with Category 3. She is not having any appreciable hunger or significant cravings. She is making better choices. She needs to get in more protein. She occasionally skips meals.  Her weight is 252 lb (114.3 kg) today and has had a weight loss of 10 pounds over a period of 2 weeks since her last visit. She has lost 25 lbs since starting treatment with [place].  Vitamin D deficiency Stacey Clark has a diagnosis of vitamin D deficiency. She is currently taking high dose vit D and denies nausea, vomiting, or muscle weakness.  At risk for osteopenia and osteoporosis Stacey Clark is at higher risk of osteopenia and osteoporosis due to vitamin D deficiency.   Diabetes II Stacey Clark has a diagnosis of diabetes type II. Stacey Clark states that her fasting blood sugars are in the 80's. Her post prandial numbers range from 80 to 90's. She is taking Trulicity 0.75mg . She denies any hypoglycemic episodes or polyphagia.Stacey Clark Her last A1c was 6.3 and Insulin was 20.0 respectively on 09/02/18. She has been working on intensive lifestyle modifications including diet, exercise, and weight loss to help control her blood glucose levels.   ALLERGIES: Allergies  Allergen Reactions  . Cephalexin Swelling  . Hydrocodone Nausea And Vomiting  . Rifampin Nausea And Vomiting, Rash and Other (See Comments)    Nausea, Vomiting  . Sulfa Antibiotics Hives  . Augmentin [Amoxicillin-Pot Clavulanate] Nausea And Vomiting  . Metformin And Related Other (See Comments)    GI upset  . Penicillins Swelling    MEDICATIONS: Current Outpatient Medications on File Prior to Visit  Medication Sig Dispense Refill  . ALPRAZolam (XANAX) 0.5 MG tablet  Take 0.5 mg by mouth at bedtime as needed.    Stacey Clark buPROPion (WELLBUTRIN SR) 150 MG 12 hr tablet Take 150 mg by mouth 2 (two) times daily.    Stacey Clark escitalopram (LEXAPRO) 5 MG tablet Take 5 mg by mouth daily.    . pantoprazole (PROTONIX) 20 MG tablet Take 20 mg by mouth daily.     No current facility-administered medications on file prior to visit.     PAST MEDICAL HISTORY: Past Medical History:  Diagnosis Date  . Anxiety   . Asthma   . Back pain   . Depression   . Diabetes mellitus without complication ([place])   . GERD (gastroesophageal reflux disease)   . HLD (hyperlipidemia)   . IBS (irritable bowel syndrome)   . Joint pain   . Obesity   . Sleep apnea     PAST SURGICAL HISTORY: Past Surgical History:  Procedure Laterality Date  . CESAREAN SECTION    . TONSILLECTOMY      SOCIAL HISTORY: Social History   Tobacco Use  . Smoking status: Former Smoker    Last attempt to quit: 12/30/2006    Years since quitting: 11.7  . Smokeless tobacco: Never Used  Substance Use Topics  . Alcohol use: No    Alcohol/week: 0.0 standard drinks  . Drug use: No    FAMILY HISTORY: Family History  Problem Relation Age of Onset  . Hypertension Mother   . Cancer Mother   . Depression Mother   . Anxiety  disorder Mother   . Sleep apnea Mother   . Obesity Mother   . Hyperlipidemia Father   . Cancer Father   . Depression Father   . Anxiety disorder Father   . Bipolar disorder Father   . Alcoholism Father   . Obesity Father   . Thyroid disease Neg Hx     ROS: Review of Systems  Constitutional: Positive for weight loss.  Gastrointestinal: Negative for nausea and vomiting.       Negative for polyphagia.  Musculoskeletal:       Negative for muscle weakness.  Endo/Heme/Allergies:       Negative for hypoglycemia.    PHYSICAL EXAM: Blood pressure 113/77, pulse 68, temperature 97.9 F (36.6 C), temperature source Oral, height 5\' 1"  (1.549 m), weight 252 lb (114.3 kg), SpO2 97 %. Body mass  index is 47.61 kg/m. Physical Exam  Constitutional: She is oriented to person, place, and time. She appears well-developed and well-nourished.  Cardiovascular: Normal rate.  Pulmonary/Chest: Effort normal.  Musculoskeletal: Normal range of motion.  Neurological: She is oriented to person, place, and time.  Skin: Skin is warm and dry.  Psychiatric: She has a normal mood and affect. Her behavior is normal.  Vitals reviewed.   RECENT LABS AND TESTS: BMET    Component Value Date/Time   NA 138 09/02/2018 1036   K 4.5 09/02/2018 1036   CL 99 09/02/2018 1036   CO2 24 09/02/2018 1036   GLUCOSE 84 09/02/2018 1036   BUN 11 09/02/2018 1036   CREATININE 0.64 09/02/2018 1036   CALCIUM 9.1 09/02/2018 1036   GFRNONAA 106 09/02/2018 1036   GFRAA 122 09/02/2018 1036   Lab Results  Component Value Date   HGBA1C 6.3 (H) 09/02/2018   HGBA1C 6.8 (H) 05/14/2018   Lab Results  Component Value Date   INSULIN 20.0 09/02/2018   INSULIN 41.6 (H) 05/14/2018   CBC    Component Value Date/Time   WBC 10.0 05/14/2018 0919   WBC 13.3 (H) 01/15/2012 1237   RBC 4.69 05/14/2018 0919   RBC 4.90 01/15/2012 1237   HGB 13.2 05/14/2018 0919   HCT 39.8 05/14/2018 0919   PLT 326 01/15/2012 1237   MCV 85 05/14/2018 0919   MCH 28.1 05/14/2018 0919   MCH 29.6 01/15/2012 1237   MCHC 33.2 05/14/2018 0919   MCHC 34.0 01/15/2012 1237   RDW 14.4 05/14/2018 0919   LYMPHSABS 2.3 05/14/2018 0919   MONOABS 0.7 01/15/2012 1237   EOSABS 0.2 05/14/2018 0919   BASOSABS 0.1 05/14/2018 0919   Iron/TIBC/Ferritin/ %Sat No results found for: IRON, TIBC, FERRITIN, IRONPCTSAT Lipid Panel     Component Value Date/Time   CHOL 182 09/02/2018 1036   TRIG 100 09/02/2018 1036   HDL 51 09/02/2018 1036   LDLCALC 111 (H) 09/02/2018 1036   Hepatic Function Panel     Component Value Date/Time   PROT 6.6 09/02/2018 1036   ALBUMIN 4.0 09/02/2018 1036   AST 11 09/02/2018 1036   ALT 11 09/02/2018 1036   ALKPHOS 78  09/02/2018 1036   BILITOT 0.3 09/02/2018 1036      Component Value Date/Time   TSH 2.360 05/14/2018 0919   Results for Stacey, Clark (MRN [id number]) as of 09/16/2018 14:05  Ref. Range 09/02/2018 10:36  Vitamin D, 25-Hydroxy Latest Ref Range: 30.0 - 100.0 ng/mL 37.2   ASSESSMENT AND PLAN: Vitamin D deficiency - Plan: Vitamin D, Ergocalciferol, (DRISDOL) 50000 units CAPS capsule  Type 2 diabetes mellitus without complication,  without long-term current use of insulin ([place]) - Plan: Dulaglutide (TRULICITY) 1.5 OY/7.7AJ SOPN  At risk for osteoporosis  Class 3 severe obesity with serious comorbidity and body mass index (BMI) of 45.0 to 49.9 in adult, unspecified obesity type ([place])  PLAN:  Vitamin D Deficiency Stacey Clark was informed that low vitamin D levels contributes to fatigue and are associated with obesity, breast, and colon cancer. She agrees to continue to take prescription Vit D @50 ,000 IU, 1 capsule weekly #4 with no refills and increase vitamin D rich foods. She agrees to follow up for routine testing of vitamin D, at least 2-3 times per year. She was informed of the risk of over-replacement of vitamin D and agrees to not increase her dose unless she discusses this with [place] first. Stacey Clark agreed to follow up in 2 weeks.   At risk for osteopenia and osteoporosis Stacey Clark was given extended (15 minutes) osteoporosis prevention counseling today. Stacey Clark is at risk for osteopenia and osteoporosis due to her vitamin D deficiency. She was encouraged to take her vitamin D and follow her higher calcium diet and increase strengthening exercise to help strengthen her bones and decrease her risk of osteopenia and osteoporosis.  Diabetes II Stacey Clark has been given extensive diabetes education by myself today including ideal fasting and post-prandial blood glucose readings, individual ideal Hgb A1c goals and hypoglycemia prevention. We discussed the importance of good blood sugar control to decrease the  likelihood of diabetic complications such as nephropathy, neuropathy, limb loss, blindness, coronary artery disease, and death. We discussed the importance of intensive lifestyle modification including diet, exercise and weight loss as the first line treatment for diabetes. Stacey Clark agrees to continue Trulicity 1.5mg /0.49ml per week for 1 month supply with no refills. She will continue to work on decreased carbohydrates and increase protein. Stacey Clark will follow up at the agreed upon time in 2 weeks.  Obesity Stacey Clark is currently in the action stage of change. As such, her goal is to continue with weight loss efforts. She has agreed to continue to follow the Category 3 plan and she will ensure that she gets adequate protein and will not skip meals. Stacey Clark has been instructed to work up to a goal of 150 minutes of combined cardio and strengthening exercise per week for weight loss and overall health benefits. We discussed the following Behavioral Modification Strategies today: increasing lean protein intake, increasing vegetables, increase H2O intake, no skipping meals, and meal planning and cooking strategies.  Stacey Clark has agreed to follow up with our clinic in 2 weeks. She was informed of the importance of frequent follow up visits to maximize her success with intensive lifestyle modifications for her multiple health conditions.   OBESITY BEHAVIORAL INTERVENTION VISIT  Today's visit was # 8  Starting weight: 277 lbs Starting date: 05/14/18 Today's weight : Weight: 252 lb (114.3 kg)  Today's date: 09/14/2018 Total lbs lost to date: 25  ASK: We discussed the diagnosis of obesity with Stacey Clark today and Stacey Clark agreed to give [place] permission to discuss obesity behavioral modification therapy today.  ASSESS: Stacey Clark has the diagnosis of obesity and her BMI today is 47.64. Stacey Clark is in the action stage of change.   ADVISE: Jamilla was educated on the multiple health risks of obesity as well as the benefit  of weight loss to improve her health. She was advised of the need for long term treatment and the importance of lifestyle modifications to improve her current health and to decrease her risk of  future health problems.  AGREE: Multiple dietary modification options and treatment options were discussed and Makenzy agreed to follow the recommendations documented in the above note.  ARRANGE: Darneshia was educated on the importance of frequent visits to treat obesity as outlined per CMS and USPSTF guidelines and agreed to schedule her next follow up appointment today.  I, Marcille Blanco, am acting as Location manager for [employer]. Owens Shark, DO  I have reviewed the above documentation for accuracy and completeness, and I agree with the above. -Jearld Lesch, DO

## 2018-09-21 MED FILL — TRULICITY 0.75 MG/0.5 ML PE: 0.75 | 28 days supply | Qty: 2 | Fill #0

## 2018-09-21 MED FILL — VIT D2 1.25 MG (50,000 UNIT: 1.25 MG | 28 days supply | Qty: 4 | Fill #0

## 2018-09-21 MED FILL — PANTOPRAZOLE SOD DR 40 MG T: 40 | 30 days supply | Qty: 30 | Fill #2

## 2018-09-22 MED FILL — ESCITALOPRAM 20 MG TABLET: 20 | 90 days supply | Qty: 90 | Fill #0

## 2018-10-08 ENCOUNTER — Ambulatory Visit (INDEPENDENT_AMBULATORY_CARE_PROVIDER_SITE_OTHER): Payer: 59 | Admitting: Bariatrics

## 2018-10-08 ENCOUNTER — Encounter (INDEPENDENT_AMBULATORY_CARE_PROVIDER_SITE_OTHER): Payer: Self-pay | Admitting: Bariatrics

## 2018-10-08 VITALS — BP 106/71 | HR 67 | Temp 98.0°F | Ht 61.0 in | Wt 253.0 lb

## 2018-10-08 DIAGNOSIS — E119 Type 2 diabetes mellitus without complications: Secondary | ICD-10-CM | POA: Diagnosis not present

## 2018-10-08 DIAGNOSIS — Z9189 Other specified personal risk factors, not elsewhere classified: Secondary | ICD-10-CM

## 2018-10-08 DIAGNOSIS — Z6841 Body Mass Index (BMI) 40.0 and over, adult: Secondary | ICD-10-CM | POA: Diagnosis not present

## 2018-10-08 DIAGNOSIS — E559 Vitamin D deficiency, unspecified: Secondary | ICD-10-CM | POA: Diagnosis not present

## 2018-10-12 MED ORDER — VITAMIN D (ERGOCALCIFEROL) 1.25 MG (50000 UNIT) PO CAPS: 50000.0000 [IU] | ORAL_CAPSULE | ORAL | 0 refills | Status: DC

## 2018-10-12 MED ORDER — DULAGLUTIDE 1.5 MG/0.5ML ~~LOC~~ SOAJ: 1.5000 mg | SUBCUTANEOUS | 0 refills | Status: DC

## 2018-10-12 NOTE — Progress Notes (Signed)
Office: [phone]  /  Fax: [phone]   HPI:   Chief Complaint: OBESITY Stacey Clark is here to discuss her progress with her obesity treatment plan. She is on the Category 3 plan and is following her eating plan approximately 65 % of the time. She states she is exercising 0 minutes 0 times per week. Stacey Clark is currently taking Dulaglutide 0.75mg  once a week. She has had increased stress with her mother in the hospital and less meal planning. She is not having side effects with the Trulicity. She denies polyphagia.  Her weight is 253 lb (114.8 kg) today and has had a weight gain of 1 pound in 3 weeks since her last visit. She has lost 24 lbs since starting treatment with [place].  Vitamin D deficiency Stacey Clark has a diagnosis of vitamin D deficiency. She is currently taking high dose vit D and denies nausea, vomiting or muscle weakness.  At risk for osteopenia and osteoporosis Stacey Clark is at higher risk of osteopenia and osteoporosis due to vitamin D deficiency.   Diabetes II Stacey Clark has a diagnosis of diabetes type II. Her last A1c was 6.3 and Insulin was 20.0 on 09/02/18. She denies polyuria and polydipsia. She has been working on intensive lifestyle modifications including diet, exercise, and weight loss to help control her blood glucose levels.  ALLERGIES: Allergies  Allergen Reactions  . Cephalexin Swelling  . Hydrocodone Nausea And Vomiting  . Rifampin Nausea And Vomiting, Rash and Other (See Comments)    Nausea, Vomiting  . Sulfa Antibiotics Hives  . Augmentin [Amoxicillin-Pot Clavulanate] Nausea And Vomiting  . Metformin And Related Other (See Comments)    GI upset  . Penicillins Swelling    MEDICATIONS: Current Outpatient Medications on File Prior to Visit  Medication Sig Dispense Refill  . ALPRAZolam (XANAX) 0.5 MG tablet Take 0.5 mg by mouth at bedtime as needed.    Marland Kitchen buPROPion (WELLBUTRIN SR) 150 MG 12 hr tablet Take 150 mg by mouth 2 (two) times daily.    . Dulaglutide (TRULICITY)  1.5 DQ/2.2WL SOPN Inject 1.5 mg into the skin once a week. (Patient taking differently: Inject 0.75 mg into the skin once a week. ) 4 pen 0  . escitalopram (LEXAPRO) 5 MG tablet Take 5 mg by mouth daily.    . pantoprazole (PROTONIX) 20 MG tablet Take 20 mg by mouth daily.    . Vitamin D, Ergocalciferol, (DRISDOL) 50000 units CAPS capsule Take 1 capsule (50,000 Units total) by mouth every 7 (seven) days. 4 capsule 0   No current facility-administered medications on file prior to visit.     PAST MEDICAL HISTORY: Past Medical History:  Diagnosis Date  . Anxiety   . Asthma   . Back pain   . Depression   . Diabetes mellitus without complication ([place])   . GERD (gastroesophageal reflux disease)   . HLD (hyperlipidemia)   . IBS (irritable bowel syndrome)   . Joint pain   . Obesity   . Sleep apnea     PAST SURGICAL HISTORY: Past Surgical History:  Procedure Laterality Date  . CESAREAN SECTION    . TONSILLECTOMY      SOCIAL HISTORY: Social History   Tobacco Use  . Smoking status: Former Smoker    Last attempt to quit: 12/30/2006    Years since quitting: 11.7  . Smokeless tobacco: Never Used  Substance Use Topics  . Alcohol use: No    Alcohol/week: 0.0 standard drinks  . Drug use: No  FAMILY HISTORY: Family History  Problem Relation Age of Onset  . Hypertension Mother   . Cancer Mother   . Depression Mother   . Anxiety disorder Mother   . Sleep apnea Mother   . Obesity Mother   . Hyperlipidemia Father   . Cancer Father   . Depression Father   . Anxiety disorder Father   . Bipolar disorder Father   . Alcoholism Father   . Obesity Father   . Thyroid disease Neg Hx     ROS: Review of Systems  Constitutional: Negative for weight loss.  Gastrointestinal: Negative for nausea and vomiting.  Genitourinary:       Negative for polyuria.  Musculoskeletal:       Negative for muscle weakness.  Endo/Heme/Allergies: Negative for polydipsia.       Negative for  polyphagia.    PHYSICAL EXAM: Blood pressure 106/71, pulse 67, temperature 98 F (36.7 C), temperature source Oral, height 5\' 1"  (1.549 m), weight 253 lb (114.8 kg), SpO2 97 %. Body mass index is 47.8 kg/m. Physical Exam  Constitutional: She is oriented to person, place, and time. She appears well-developed and well-nourished.  Cardiovascular: Normal rate.  Pulmonary/Chest: Effort normal.  Musculoskeletal: Normal range of motion.  Neurological: She is oriented to person, place, and time.  Skin: Skin is warm and dry.  Psychiatric: She has a normal mood and affect. Her behavior is normal.  Vitals reviewed.   RECENT LABS AND TESTS: BMET    Component Value Date/Time   NA 138 09/02/2018 1036   K 4.5 09/02/2018 1036   CL 99 09/02/2018 1036   CO2 24 09/02/2018 1036   GLUCOSE 84 09/02/2018 1036   BUN 11 09/02/2018 1036   CREATININE 0.64 09/02/2018 1036   CALCIUM 9.1 09/02/2018 1036   GFRNONAA 106 09/02/2018 1036   GFRAA 122 09/02/2018 1036   Lab Results  Component Value Date   HGBA1C 6.3 (H) 09/02/2018   HGBA1C 6.8 (H) 05/14/2018   Lab Results  Component Value Date   INSULIN 20.0 09/02/2018   INSULIN 41.6 (H) 05/14/2018   CBC    Component Value Date/Time   WBC 10.0 05/14/2018 0919   WBC 13.3 (H) 01/15/2012 1237   RBC 4.69 05/14/2018 0919   RBC 4.90 01/15/2012 1237   HGB 13.2 05/14/2018 0919   HCT 39.8 05/14/2018 0919   PLT 326 01/15/2012 1237   MCV 85 05/14/2018 0919   MCH 28.1 05/14/2018 0919   MCH 29.6 01/15/2012 1237   MCHC 33.2 05/14/2018 0919   MCHC 34.0 01/15/2012 1237   RDW 14.4 05/14/2018 0919   LYMPHSABS 2.3 05/14/2018 0919   MONOABS 0.7 01/15/2012 1237   EOSABS 0.2 05/14/2018 0919   BASOSABS 0.1 05/14/2018 0919   Iron/TIBC/Ferritin/ %Sat No results found for: IRON, TIBC, FERRITIN, IRONPCTSAT Lipid Panel     Component Value Date/Time   CHOL 182 09/02/2018 1036   TRIG 100 09/02/2018 1036   HDL 51 09/02/2018 1036   LDLCALC 111 (H) 09/02/2018  1036   Hepatic Function Panel     Component Value Date/Time   PROT 6.6 09/02/2018 1036   ALBUMIN 4.0 09/02/2018 1036   AST 11 09/02/2018 1036   ALT 11 09/02/2018 1036   ALKPHOS 78 09/02/2018 1036   BILITOT 0.3 09/02/2018 1036      Component Value Date/Time   TSH 2.360 05/14/2018 0919   Results for TONISHA, SILVEY (MRN [id number]) as of 10/12/2018 12:11  Ref. Range 09/02/2018 10:36  Vitamin D, 25-Hydroxy  Latest Ref Range: 30.0 - 100.0 ng/mL 37.2   ASSESSMENT AND PLAN: Vitamin D deficiency - Plan: Vitamin D, Ergocalciferol, (DRISDOL) 50000 units CAPS capsule  Type 2 diabetes mellitus without complication, without long-term current use of insulin ([facility]) - Plan: Dulaglutide (TRULICITY) 1.5 OQ/9.4TM SOPN  At risk for osteoporosis  Class 3 severe obesity with serious comorbidity and body mass index (BMI) of 50.0 to 59.9 in adult, unspecified obesity type ([place])  PLAN:  Vitamin D Deficiency Stacey Clark was informed that low vitamin D levels contributes to fatigue and are associated with obesity, breast, and colon cancer. She agrees to continue to take prescription Vit D @50 ,000 IU every week #4 with no refills and will follow up for routine testing of vitamin D, at least 2-3 times per year. She was informed of the risk of over-replacement of vitamin D and agrees to not increase her dose unless she discusses this with [place] first. Kandas agrees to follow up in 2 weeks.  At risk for osteopenia and osteoporosis Stacey Clark was given extended (15 minutes) osteoporosis prevention counseling today. Stacey Clark is at risk for osteopenia and osteoporosis due to her vitamin D deficiency. She was encouraged to take her vitamin D and follow her higher calcium diet and increase strengthening exercise to help strengthen her bones and decrease her risk of osteopenia and osteoporosis.  Diabetes II Stacey Clark has been given extensive diabetes education by myself today including ideal fasting and post-prandial blood glucose readings,  individual ideal HgbA1c goals, and hypoglycemia prevention. We discussed the importance of good blood sugar control to decrease the likelihood of diabetic complications such as nephropathy, neuropathy, limb loss, blindness, coronary artery disease, and death. We discussed the importance of intensive lifestyle modification including diet, exercise and weight loss as the first line treatment for diabetes. Stacey Clark agrees to continue her dulaglutide 1.5/1.10mL with a 1 month supply and no refills and will follow up at the agreed upon time in 2 weeks.  Obesity Stacey Clark is currently in the action stage of change. As such, her goal is to continue with weight loss efforts. She has agreed to follow the Category 3 plan. Stacey Clark has been instructed to resume exercise like walking her dog and doing Zumba. We discussed the following Behavioral Modification Strategies today: increasing lean protein intake, decreasing simple carbohydrates, increasing vegetables, increase H2O intake, decrease eating out, no skipping meals, and work on meal planning and easy cooking plans. We discussed various medication options to help Stacey Clark with her weight loss efforts and we both agreed to increase Dulaglutide 1.5mg /0.13mL once a week with a 1 month supply and no refills. She also agrees to decrease carbohydrates and increase protein.  Stacey Clark has agreed to follow up with our clinic in 2 weeks. She was informed of the importance of frequent follow up visits to maximize her success with intensive lifestyle modifications for her multiple health conditions.   OBESITY BEHAVIORAL INTERVENTION VISIT  Today's visit was # 9   Starting weight: 277 lbs Starting date: 05/14/18 Today's weight : Weight: 253 lb (114.8 kg)  Today's date: 10/08/2018 Total lbs lost to date: 24  ASK: We discussed the diagnosis of obesity with Stacey Clark today and Stacey Clark agreed to give [place] permission to discuss obesity behavioral modification therapy  today.  ASSESS: Stacey Clark has the diagnosis of obesity and her BMI today is 47.83. Stacey Clark is in the action stage of change.   ADVISE: Stacey Clark was educated on the multiple health risks of obesity as well as the benefit of  weight loss to improve her health. She was advised of the need for long term treatment and the importance of lifestyle modifications to improve her current health and to decrease her risk of future health problems.  AGREE: Multiple dietary modification options and treatment options were discussed and Stacey Clark agreed to follow the recommendations documented in the above note.  ARRANGE: Stacey Clark was educated on the importance of frequent visits to treat obesity as outlined per CMS and USPSTF guidelines and agreed to schedule her next follow up appointment today.  I, Marcille Blanco, am acting as Location manager for [employer]. Owens Shark, DO  I have reviewed the above documentation for accuracy and completeness, and I agree with the above. -Jearld Lesch, DO

## 2018-10-13 MED FILL — TRULICITY 1.5 MG/0.5 ML PEN: 1.5 | 28 days supply | Qty: 2 | Fill #0

## 2018-10-13 MED FILL — VIT D2 1.25 MG (50,000 UNIT: 1.25 MG | 28 days supply | Qty: 4 | Fill #0

## 2018-10-21 ENCOUNTER — Ambulatory Visit (INDEPENDENT_AMBULATORY_CARE_PROVIDER_SITE_OTHER): Payer: Self-pay | Admitting: Physician Assistant

## 2018-10-28 ENCOUNTER — Ambulatory Visit (INDEPENDENT_AMBULATORY_CARE_PROVIDER_SITE_OTHER): Payer: 59 | Admitting: Family Medicine

## 2018-10-28 ENCOUNTER — Encounter (INDEPENDENT_AMBULATORY_CARE_PROVIDER_SITE_OTHER): Payer: Self-pay | Admitting: Family Medicine

## 2018-10-28 VITALS — BP 120/77 | HR 73 | Temp 97.7°F | Ht 61.0 in | Wt 253.0 lb

## 2018-10-28 DIAGNOSIS — Z6841 Body Mass Index (BMI) 40.0 and over, adult: Secondary | ICD-10-CM | POA: Diagnosis not present

## 2018-10-28 DIAGNOSIS — E559 Vitamin D deficiency, unspecified: Secondary | ICD-10-CM | POA: Diagnosis not present

## 2018-10-28 DIAGNOSIS — Z9189 Other specified personal risk factors, not elsewhere classified: Secondary | ICD-10-CM | POA: Diagnosis not present

## 2018-10-28 DIAGNOSIS — E119 Type 2 diabetes mellitus without complications: Secondary | ICD-10-CM

## 2018-10-28 DIAGNOSIS — K5909 Other constipation: Secondary | ICD-10-CM

## 2018-10-28 NOTE — Progress Notes (Signed)
Office: [phone]  /  Fax: [phone]   HPI:   Chief Complaint: OBESITY Stacey Clark is here to discuss her progress with her obesity treatment plan. She is on the  follow the Category 3 plan and is following her eating plan approximately 80 % of the time. She states she is exercising by walking for 30 minutes 3 times per week. Stacey Clark is unsatisfied with progress. She feels she did better with journaling.   Her weight is 253 lb (114.8 kg) today and has maintained weight  since her last visit. She has lost 24 lbs since starting treatment with [place].  Diabetes II Stacey Clark has a diagnosis of diabetes type II. Stacey Clark states fasting BGs around 85 and 2 hours postprandial BGs range between 99-114 and denies any hypoglycemic episodes. Last A1c was 6.3. She has been working on intensive lifestyle modifications including diet, exercise, and weight loss to help control her blood glucose levels. She is currently taking dulaglutide. She reports constipation.   Vitamin D deficiency Stacey Clark has a diagnosis of vitamin D deficiency. She is currently taking vit D and denies nausea, vomiting or muscle weakness. She reports fatigue.   Constipation Stacey Clark notes constipation for the last few weeks, worse since attempting weight loss. She states BM are less frequent and are hard and painful. She denies hematochezia or melena. She admits drinking less H20 recently.  At risk for osteopenia and osteoporosis Stacey Clark is at higher risk of osteopenia and osteoporosis due to vitamin D deficiency.   ALLERGIES: Allergies  Allergen Reactions  . Cephalexin Swelling  . Hydrocodone Nausea And Vomiting  . Rifampin Nausea And Vomiting, Rash and Other (See Comments)    Nausea, Vomiting  . Sulfa Antibiotics Hives  . Augmentin [Amoxicillin-Pot Clavulanate] Nausea And Vomiting  . Metformin And Related Other (See Comments)    GI upset  . Penicillins Swelling    MEDICATIONS: Current Outpatient Medications on File Prior to Visit    Medication Sig Dispense Refill  . ALPRAZolam (XANAX) 0.5 MG tablet Take 0.5 mg by mouth at bedtime as needed.    Marland Kitchen buPROPion (WELLBUTRIN SR) 150 MG 12 hr tablet Take 150 mg by mouth 2 (two) times daily.    . Dulaglutide (TRULICITY) 1.5 MV/7.8IO SOPN Inject 1.5 mg into the skin once a week. 4 pen 0  . escitalopram (LEXAPRO) 5 MG tablet Take 5 mg by mouth daily.    . pantoprazole (PROTONIX) 20 MG tablet Take 20 mg by mouth daily.    . Vitamin D, Ergocalciferol, (DRISDOL) 50000 units CAPS capsule Take 1 capsule (50,000 Units total) by mouth every 7 (seven) days. 4 capsule 0   No current facility-administered medications on file prior to visit.     PAST MEDICAL HISTORY: Past Medical History:  Diagnosis Date  . Anxiety   . Asthma   . Back pain   . Depression   . Diabetes mellitus without complication ([place])   . GERD (gastroesophageal reflux disease)   . HLD (hyperlipidemia)   . IBS (irritable bowel syndrome)   . Joint pain   . Obesity   . Sleep apnea     PAST SURGICAL HISTORY: Past Surgical History:  Procedure Laterality Date  . CESAREAN SECTION    . TONSILLECTOMY      SOCIAL HISTORY: Social History   Tobacco Use  . Smoking status: Former Smoker    Last attempt to quit: 12/30/2006    Years since quitting: 11.8  . Smokeless tobacco: Never Used  Substance Use Topics  .  Alcohol use: No    Alcohol/week: 0.0 standard drinks  . Drug use: No    FAMILY HISTORY: Family History  Problem Relation Age of Onset  . Hypertension Mother   . Cancer Mother   . Depression Mother   . Anxiety disorder Mother   . Sleep apnea Mother   . Obesity Mother   . Hyperlipidemia Father   . Cancer Father   . Depression Father   . Anxiety disorder Father   . Bipolar disorder Father   . Alcoholism Father   . Obesity Father   . Thyroid disease Neg Hx     ROS: Review of Systems  Constitutional: Positive for malaise/fatigue. Negative for weight loss.  Gastrointestinal: Positive for  constipation. Negative for melena, nausea and vomiting.       Negative for hematochezia  Musculoskeletal:       Negative for muscle weakness  Endo/Heme/Allergies:       Negative for hypoglycemia    PHYSICAL EXAM: Blood pressure 120/77, pulse 73, temperature 97.7 F (36.5 C), temperature source Oral, height 5\' 1"  (1.549 m), weight 253 lb (114.8 kg), SpO2 95 %. Body mass index is 47.8 kg/m. Physical Exam  Constitutional: She is oriented to person, place, and time. She appears well-developed and well-nourished.  HENT:  Head: Normocephalic.  Neck: Normal range of motion.  Cardiovascular: Normal rate.  Pulmonary/Chest: Effort normal.  Musculoskeletal: Normal range of motion.  Neurological: She is alert and oriented to person, place, and time.  Skin: Skin is warm and dry.  Psychiatric: She has a normal mood and affect. Her behavior is normal.  Vitals reviewed.   RECENT LABS AND TESTS: BMET    Component Value Date/Time   NA 138 09/02/2018 1036   K 4.5 09/02/2018 1036   CL 99 09/02/2018 1036   CO2 24 09/02/2018 1036   GLUCOSE 84 09/02/2018 1036   BUN 11 09/02/2018 1036   CREATININE 0.64 09/02/2018 1036   CALCIUM 9.1 09/02/2018 1036   GFRNONAA 106 09/02/2018 1036   GFRAA 122 09/02/2018 1036   Lab Results  Component Value Date   HGBA1C 6.3 (H) 09/02/2018   HGBA1C 6.8 (H) 05/14/2018   Lab Results  Component Value Date   INSULIN 20.0 09/02/2018   INSULIN 41.6 (H) 05/14/2018   CBC    Component Value Date/Time   WBC 10.0 05/14/2018 0919   WBC 13.3 (H) 01/15/2012 1237   RBC 4.69 05/14/2018 0919   RBC 4.90 01/15/2012 1237   HGB 13.2 05/14/2018 0919   HCT 39.8 05/14/2018 0919   PLT 326 01/15/2012 1237   MCV 85 05/14/2018 0919   MCH 28.1 05/14/2018 0919   MCH 29.6 01/15/2012 1237   MCHC 33.2 05/14/2018 0919   MCHC 34.0 01/15/2012 1237   RDW 14.4 05/14/2018 0919   LYMPHSABS 2.3 05/14/2018 0919   MONOABS 0.7 01/15/2012 1237   EOSABS 0.2 05/14/2018 0919   BASOSABS  0.1 05/14/2018 0919   Iron/TIBC/Ferritin/ %Sat No results found for: IRON, TIBC, FERRITIN, IRONPCTSAT Lipid Panel     Component Value Date/Time   CHOL 182 09/02/2018 1036   TRIG 100 09/02/2018 1036   HDL 51 09/02/2018 1036   LDLCALC 111 (H) 09/02/2018 1036   Hepatic Function Panel     Component Value Date/Time   PROT 6.6 09/02/2018 1036   ALBUMIN 4.0 09/02/2018 1036   AST 11 09/02/2018 1036   ALT 11 09/02/2018 1036   ALKPHOS 78 09/02/2018 1036   BILITOT 0.3 09/02/2018 1036  Component Value Date/Time   TSH 2.360 05/14/2018 0919    Ref. Range 09/02/2018 10:36  Vitamin D, 25-Hydroxy Latest Ref Range: 30.0 - 100.0 ng/mL 37.2    ASSESSMENT AND PLAN: Type 2 diabetes mellitus without complication, without long-term current use of insulin (HCC)  Vitamin D deficiency  At risk for osteoporosis  Other constipation  Class 3 severe obesity with serious comorbidity and body mass index (BMI) of 40.0 to 44.9 in adult, unspecified obesity type ([place])  PLAN: Diabetes II Stacey Clark has been given extensive diabetes education by myself today including ideal fasting and post-prandial blood glucose readings, individual ideal HgA1c goals  and hypoglycemia prevention. We discussed the importance of good blood sugar control to decrease the likelihood of diabetic complications such as nephropathy, neuropathy, limb loss, blindness, coronary artery disease, and death. We discussed the importance of intensive lifestyle modification including diet, exercise and weight loss as the first line treatment for diabetes. Alizia agrees to continue her dulaglutide ( no refill needed today) and will follow up at the agreed upon time.  Vitamin D Deficiency Stacey Clark was informed that low vitamin D levels contributes to fatigue and are associated with obesity, breast, and colon cancer. She agrees to continue to take prescription Vit D @50 ,000 IU every week and will follow up for routine testing of vitamin D, at least 2-3  times per year. She was informed of the risk of over-replacement of vitamin D and agrees to not increase her dose unless she discusses this with [place] first. Agrees to follow up with our clinic as directed.   Constipation Stacey Clark was informed decrease bowel movement frequency is normal while losing weight, but stools should not be hard or painful. She was advised to increase her H20 intake and work on increasing her fiber intake. High fiber foods were discussed today. She agrees to take benefiber daily and switch to Miralax if Benefiber does not work.   At risk for osteopenia and osteoporosis Stacey Clark was given extended  (15 minutes) osteoporosis prevention counseling today. Stacey Clark is at risk for osteopenia and osteoporosis due to her vitamin D deficiency. She was encouraged to take her vitamin D and follow her higher calcium diet and increase strengthening exercise to help strengthen her bones and decrease her risk of osteopenia and osteoporosis.  Obesity Stacey Clark is currently in the action stage of change. As such, her goal is to continue with weight loss efforts She has agreed to keep a food journal with 1800 calories and 120-130g of protein daily  Stacey Clark has been instructed to continue walking for 30 minutes 3 times  per week for weight loss and overall health benefits. Discussed checking into [facility] exercise classes.  We discussed the following Behavioral Modification Strategies today: work on meal planning and easy cooking plans, increasing water intake, and planning for success.    Stacey Clark has agreed to follow up with our clinic in 3 weeks. She was informed of the importance of frequent follow up visits to maximize her success with intensive lifestyle modifications for her multiple health conditions.   OBESITY BEHAVIORAL INTERVENTION VISIT  Today's visit was # 10   Starting weight: 277 lb Starting date: 05/13/18 Today's weight : 253 lb Today's date: 10/29/2018 Total lbs lost to date: 24  lb   ASK: We discussed the diagnosis of obesity with Stacey Clark today and Stacey Clark agreed to give [place] permission to discuss obesity behavioral modification therapy today.  ASSESS: Stacey Clark has the diagnosis of obesity and her BMI today is  47.83 Stacey Clark is in the action stage of change   ADVISE: Garrie was educated on the multiple health risks of obesity as well as the benefit of weight loss to improve her health. She was advised of the need for long term treatment and the importance of lifestyle modifications to improve her current health and to decrease her risk of future health problems.  AGREE: Multiple dietary modification options and treatment options were discussed and  Angeleena agreed to follow the recommendations documented in the above note.  ARRANGE: Marguita was educated on the importance of frequent visits to treat obesity as outlined per CMS and USPSTF guidelines and agreed to schedule her next follow up appointment today.  I, Stacey Clark, am acting as Location manager for Stacey Clark, [place].  I have reviewed the above documentation for accuracy and completeness, and I agree with the above. Stacey Schwab, FNP-C.

## 2018-10-29 ENCOUNTER — Encounter (INDEPENDENT_AMBULATORY_CARE_PROVIDER_SITE_OTHER): Payer: Self-pay | Admitting: Family Medicine

## 2018-10-30 DIAGNOSIS — Z23 Encounter for immunization: Secondary | ICD-10-CM | POA: Diagnosis not present

## 2018-10-30 DIAGNOSIS — Z0000 Encounter for general adult medical examination without abnormal findings: Secondary | ICD-10-CM | POA: Diagnosis not present

## 2018-11-02 MED FILL — PANTOPRAZOLE SOD DR 40 MG T: 40 | 30 days supply | Qty: 30 | Fill #0

## 2018-11-12 ENCOUNTER — Other Ambulatory Visit (INDEPENDENT_AMBULATORY_CARE_PROVIDER_SITE_OTHER): Payer: Self-pay | Admitting: Bariatrics

## 2018-11-12 ENCOUNTER — Encounter (INDEPENDENT_AMBULATORY_CARE_PROVIDER_SITE_OTHER): Payer: Self-pay | Admitting: Bariatrics

## 2018-11-12 DIAGNOSIS — E559 Vitamin D deficiency, unspecified: Secondary | ICD-10-CM

## 2018-11-12 DIAGNOSIS — E119 Type 2 diabetes mellitus without complications: Secondary | ICD-10-CM

## 2018-11-13 ENCOUNTER — Other Ambulatory Visit (INDEPENDENT_AMBULATORY_CARE_PROVIDER_SITE_OTHER): Payer: Self-pay | Admitting: Bariatrics

## 2018-11-13 DIAGNOSIS — E559 Vitamin D deficiency, unspecified: Secondary | ICD-10-CM

## 2018-11-13 MED ORDER — VITAMIN D (ERGOCALCIFEROL) 1.25 MG (50000 UNIT) PO CAPS: 50000.0000 [IU] | ORAL_CAPSULE | ORAL | 0 refills | Status: DC

## 2018-11-13 MED ORDER — DULAGLUTIDE 1.5 MG/0.5ML ~~LOC~~ SOAJ: 1.5000 mg | SUBCUTANEOUS | 0 refills | Status: DC

## 2018-11-13 MED FILL — VIT D2 1.25 MG (50,000 UNIT: 1.25 MG | 28 days supply | Qty: 4 | Fill #0

## 2018-11-13 MED FILL — TRULICITY 1.5 MG/0.5 ML PEN: 1.5 | 28 days supply | Qty: 2 | Fill #0

## 2018-11-17 ENCOUNTER — Encounter (INDEPENDENT_AMBULATORY_CARE_PROVIDER_SITE_OTHER): Payer: Self-pay | Admitting: Family Medicine

## 2018-11-17 ENCOUNTER — Ambulatory Visit (INDEPENDENT_AMBULATORY_CARE_PROVIDER_SITE_OTHER): Payer: 59 | Admitting: Family Medicine

## 2018-11-17 VITALS — BP 104/72 | HR 65 | Temp 97.9°F | Ht 61.0 in | Wt 251.0 lb

## 2018-11-17 DIAGNOSIS — E559 Vitamin D deficiency, unspecified: Secondary | ICD-10-CM

## 2018-11-17 DIAGNOSIS — Z9189 Other specified personal risk factors, not elsewhere classified: Secondary | ICD-10-CM | POA: Diagnosis not present

## 2018-11-17 DIAGNOSIS — Z6841 Body Mass Index (BMI) 40.0 and over, adult: Secondary | ICD-10-CM

## 2018-11-17 DIAGNOSIS — E119 Type 2 diabetes mellitus without complications: Secondary | ICD-10-CM | POA: Diagnosis not present

## 2018-11-17 DIAGNOSIS — K5909 Other constipation: Secondary | ICD-10-CM

## 2018-11-18 LAB — MICROALBUMIN / CREATININE URINE RATIO
Creatinine, Urine: 112.8 mg/dL
Microalb/Creat Ratio: 16.1 mg/g creat (ref 0.0–30.0)
Microalbumin, Urine: 18.2 ug/mL

## 2018-11-23 ENCOUNTER — Encounter (INDEPENDENT_AMBULATORY_CARE_PROVIDER_SITE_OTHER): Payer: Self-pay | Admitting: Family Medicine

## 2018-11-23 NOTE — Progress Notes (Signed)
Office: [phone]  /  Fax: [phone]   HPI:   Chief Complaint: OBESITY Stacey Clark is here to discuss her progress with her obesity treatment plan. She is keeping a food journal with 1600 calories and 120 to 130 grams of protein and is following her eating plan approximately 85 % of the time. She states she is walking 30 minutes 7 times per week. [name] is meeting her protein goals.  Her weight is 251 lb (113.9 kg) today and has had a weight loss of 2 pounds over a period of 3 weeks since her last visit. She has lost 26 lbs since starting treatment with [place].  Diabetes II Stacey Clark has a diagnosis of diabetes type II. Stacey Clark states that her fasting BG's range between 72 and 85 and her 2 hour post prandial sugars are 65 to 90. She is taking Trulicity/dulaglutide 1.5mg . Stacey Clark admits to hypoglycemia. Metformin causes GI upset so she is not currently taking metformin. . Last A1c was 6.3 on 09/02/18. She has been working on intensive lifestyle modifications including diet, exercise, and weight loss to help control her blood glucose levels.  Constipation Stacey Clark notes constipation for the last few weeks, worse since attempting weight loss. She states that her BM are less frequent and she denies hematochezia or melena. She has had improvement with Miralax.  Vitamin D deficiency August has a diagnosis of vitamin D deficiency. She is currently taking vit D and is stable, but not at goal. She denies nausea, vomiting, or muscle weakness.  At risk for osteopenia and osteoporosis [name] is at higher risk of osteopenia and osteoporosis due to vitamin D deficiency.   ALLERGIES: Allergies  Allergen Reactions  . Cephalexin Swelling  . Hydrocodone Nausea And Vomiting  . Rifampin Nausea And Vomiting, Rash and Other (See Comments)    Nausea, Vomiting  . Sulfa Antibiotics Hives  . Augmentin [Amoxicillin-Pot Clavulanate] Nausea And Vomiting  . Metformin And Related Other (See Comments)    GI upset  . Penicillins  Swelling    MEDICATIONS: Current Outpatient Medications on File Prior to Visit  Medication Sig Dispense Refill  . ALPRAZolam (XANAX) 0.5 MG tablet Take 0.5 mg by mouth at bedtime as needed.    Marland Kitchen buPROPion (WELLBUTRIN SR) 150 MG 12 hr tablet Take 150 mg by mouth 2 (two) times daily.    . Dulaglutide (TRULICITY) 1.5 WI/2.0BT SOPN Inject 1.5 mg into the skin once a week. (Patient taking differently: Inject 0.75 mg into the skin once a week. ) 2 pen 0  . escitalopram (LEXAPRO) 5 MG tablet Take 5 mg by mouth daily.    . pantoprazole (PROTONIX) 20 MG tablet Take 20 mg by mouth daily.    . Vitamin D, Ergocalciferol, (DRISDOL) 1.25 MG (50000 UT) CAPS capsule Take 1 capsule (50,000 Units total) by mouth every 7 (seven) days. 2 capsule 0   No current facility-administered medications on file prior to visit.     PAST MEDICAL HISTORY: Past Medical History:  Diagnosis Date  . Anxiety   . Asthma   . Back pain   . Depression   . Diabetes mellitus without complication ([place])   . GERD (gastroesophageal reflux disease)   . HLD (hyperlipidemia)   . IBS (irritable bowel syndrome)   . Joint pain   . Obesity   . Sleep apnea     PAST SURGICAL HISTORY: Past Surgical History:  Procedure Laterality Date  . CESAREAN SECTION    . TONSILLECTOMY      SOCIAL HISTORY:  Social History   Tobacco Use  . Smoking status: Former Smoker    Last attempt to quit: 12/30/2006    Years since quitting: 11.9  . Smokeless tobacco: Never Used  Substance Use Topics  . Alcohol use: No    Alcohol/week: 0.0 standard drinks  . Drug use: No    FAMILY HISTORY: Family History  Problem Relation Age of Onset  . Hypertension Mother   . Cancer Mother   . Depression Mother   . Anxiety disorder Mother   . Sleep apnea Mother   . Obesity Mother   . Hyperlipidemia Father   . Cancer Father   . Depression Father   . Anxiety disorder Father   . Bipolar disorder Father   . Alcoholism Father   . Obesity Father   .  Thyroid disease Neg Hx     ROS: Review of Systems  Constitutional: Positive for weight loss.  Gastrointestinal: Positive for constipation. Negative for melena, nausea and vomiting.       Negative for hematochezia.  Musculoskeletal:       Negative for muscle weakness.  Endo/Heme/Allergies:       Positive for hyperglycemia.    PHYSICAL EXAM: Blood pressure 104/72, pulse 65, temperature 97.9 F (36.6 C), temperature source Oral, height 5\' 1"  (1.549 m), weight 251 lb (113.9 kg), SpO2 95 %. Body mass index is 47.43 kg/m. Physical Exam  Constitutional: She is oriented to person, place, and time. She appears well-developed and well-nourished.  Cardiovascular: Normal rate.  Pulmonary/Chest: Effort normal.  Musculoskeletal: Normal range of motion.  Neurological: She is oriented to person, place, and time.  Skin: Skin is warm and dry.  Psychiatric: She has a normal mood and affect. Her behavior is normal.  Vitals reviewed.   RECENT LABS AND TESTS: BMET    Component Value Date/Time   NA 138 09/02/2018 1036   K 4.5 09/02/2018 1036   CL 99 09/02/2018 1036   CO2 24 09/02/2018 1036   GLUCOSE 84 09/02/2018 1036   BUN 11 09/02/2018 1036   CREATININE 0.64 09/02/2018 1036   CALCIUM 9.1 09/02/2018 1036   GFRNONAA 106 09/02/2018 1036   GFRAA 122 09/02/2018 1036   Lab Results  Component Value Date   HGBA1C 6.3 (H) 09/02/2018   HGBA1C 6.8 (H) 05/14/2018   Lab Results  Component Value Date   INSULIN 20.0 09/02/2018   INSULIN 41.6 (H) 05/14/2018   CBC    Component Value Date/Time   WBC 10.0 05/14/2018 0919   WBC 13.3 (H) 01/15/2012 1237   RBC 4.69 05/14/2018 0919   RBC 4.90 01/15/2012 1237   HGB 13.2 05/14/2018 0919   HCT 39.8 05/14/2018 0919   PLT 326 01/15/2012 1237   MCV 85 05/14/2018 0919   MCH 28.1 05/14/2018 0919   MCH 29.6 01/15/2012 1237   MCHC 33.2 05/14/2018 0919   MCHC 34.0 01/15/2012 1237   RDW 14.4 05/14/2018 0919   LYMPHSABS 2.3 05/14/2018 0919   MONOABS  0.7 01/15/2012 1237   EOSABS 0.2 05/14/2018 0919   BASOSABS 0.1 05/14/2018 0919   Iron/TIBC/Ferritin/ %Sat No results found for: IRON, TIBC, FERRITIN, IRONPCTSAT Lipid Panel     Component Value Date/Time   CHOL 182 09/02/2018 1036   TRIG 100 09/02/2018 1036   HDL 51 09/02/2018 1036   LDLCALC 111 (H) 09/02/2018 1036   Hepatic Function Panel     Component Value Date/Time   PROT 6.6 09/02/2018 1036   ALBUMIN 4.0 09/02/2018 1036   AST 11  09/02/2018 1036   ALT 11 09/02/2018 1036   ALKPHOS 78 09/02/2018 1036   BILITOT 0.3 09/02/2018 1036      Component Value Date/Time   TSH 2.360 05/14/2018 0919   Results for JEVAEH, SHAMS (MRN [id number]) as of 11/23/2018 06:33  Ref. Range 09/02/2018 10:36  Vitamin D, 25-Hydroxy Latest Ref Range: 30.0 - 100.0 ng/mL 37.2   ASSESSMENT AND PLAN: Type 2 diabetes mellitus without complication, without long-term current use of insulin (HCC) - Plan: Microalbumin / creatinine urine ratio  Other constipation  Vitamin D deficiency  At risk for osteoporosis  Class 3 severe obesity with serious comorbidity and body mass index (BMI) of 45.0 to 49.9 in adult, unspecified obesity type ([place])  PLAN:  Diabetes II Evadne has been given extensive diabetes education by myself today including ideal fasting and post-prandial blood glucose readings, individual ideal Hgb A1c goals, and hypoglycemia prevention. We discussed the importance of good blood sugar control to decrease the likelihood of diabetic complications such as nephropathy, neuropathy, limb loss, blindness, coronary artery disease, and death. We discussed the importance of intensive lifestyle modification including diet, exercise and weight loss as the first line treatment for diabetes. Amariya agrees to decrease Trulicity to 0.75mg  daily since she is having some hypoglycemia (CBGs around 70). We will check her urine/microalbumin today and she agrees to follow up at the agreed upon time in 3  weeks.  Constipation Kyilee was informed decrease bowel movement frequency is normal while losing weight, but stools should not be hard or painful. She was advised to increase her H2O intake and work on increasing her fiber intake. High fiber foods were discussed today. Alishah agrees to continue Miralax as needed and to follow up at the agreed upon time.  Vitamin D Deficiency Shayda was informed that low vitamin D levels contributes to fatigue and are associated with obesity, breast, and colon cancer. She agrees to continue to take prescription Vit D @50 ,000 IU every week and will follow up for routine testing of vitamin D, at least 2-3 times per year. She was informed of the risk of over-replacement of vitamin D and agrees to not increase her dose unless she discusses this with [place] first. Tawsha agrees with this plan.  At risk for osteopenia and osteoporosis Milani was given extended (15 minutes) osteoporosis prevention counseling today. Meriam is at risk for osteopenia and osteoporosis due to her vitamin D deficiency. She was encouraged to take her vitamin D and follow her higher calcium diet and increase strengthening exercise to help strengthen her bones and decrease her risk of osteopenia and osteoporosis.  Obesity Javiana is currently in the action stage of change. As such, her goal is to continue with weight loss efforts. She has agreed to keep a food journal with 1600 calories and 120 to 130 grams of protein.  Lota has been instructed to continue walking 7 times per week. We discussed the following Behavioral Modification Strategies today: increase H2O intake, holiday eating strategies, avoiding temptations, and planning for success.  Laurie has agreed to follow up with our clinic in 3 weeks. She was informed of the importance of frequent follow up visits to maximize her success with intensive lifestyle modifications for her multiple health conditions.   OBESITY BEHAVIORAL INTERVENTION  VISIT  Today's visit was # 11   Starting weight: 277 lbs Starting date: 05/14/18 Today's weight : Weight: 251 lb (113.9 kg)  Today's date: 11/17/2018 Total lbs lost to date: 26  ASK: We discussed the  diagnosis of obesity with Julieta Gutting today and Benisha agreed to give [place] permission to discuss obesity behavioral modification therapy today.  ASSESS: Jaslyn has the diagnosis of obesity and her BMI today is 47.45. Samentha is in the action stage of change.   ADVISE: Jeslie was educated on the multiple health risks of obesity as well as the benefit of weight loss to improve her health. She was advised of the need for long term treatment and the importance of lifestyle modifications to improve her current health and to decrease her risk of future health problems.  AGREE: Multiple dietary modification options and treatment options were discussed and Jere agreed to follow the recommendations documented in the above note.  ARRANGE: Kelia was educated on the importance of frequent visits to treat obesity as outlined per CMS and USPSTF guidelines and agreed to schedule her next follow up appointment today.  Lenward Chancellor, am acting as Location manager for Georgianne Fick, FNP.  I have reviewed the above documentation for accuracy and completeness, and I agree with the above.  - Yesly Gerety, FNP-C.

## 2018-12-08 ENCOUNTER — Encounter (INDEPENDENT_AMBULATORY_CARE_PROVIDER_SITE_OTHER): Payer: Self-pay | Admitting: Family Medicine

## 2018-12-08 ENCOUNTER — Ambulatory Visit (INDEPENDENT_AMBULATORY_CARE_PROVIDER_SITE_OTHER): Payer: 59 | Admitting: Family Medicine

## 2018-12-08 VITALS — BP 122/77 | HR 64 | Temp 97.7°F | Ht 61.0 in | Wt 252.0 lb

## 2018-12-08 DIAGNOSIS — Z9189 Other specified personal risk factors, not elsewhere classified: Secondary | ICD-10-CM | POA: Diagnosis not present

## 2018-12-08 DIAGNOSIS — K219 Gastro-esophageal reflux disease without esophagitis: Secondary | ICD-10-CM

## 2018-12-08 DIAGNOSIS — Z6841 Body Mass Index (BMI) 40.0 and over, adult: Secondary | ICD-10-CM | POA: Diagnosis not present

## 2018-12-08 DIAGNOSIS — E7849 Other hyperlipidemia: Secondary | ICD-10-CM

## 2018-12-08 DIAGNOSIS — E559 Vitamin D deficiency, unspecified: Secondary | ICD-10-CM | POA: Diagnosis not present

## 2018-12-08 DIAGNOSIS — E119 Type 2 diabetes mellitus without complications: Secondary | ICD-10-CM | POA: Diagnosis not present

## 2018-12-08 MED ORDER — PANTOPRAZOLE SODIUM 20 MG PO TBEC: 20.0000 mg | DELAYED_RELEASE_TABLET | Freq: Every day | ORAL | 0 refills | Status: DC

## 2018-12-08 MED ORDER — VITAMIN D (ERGOCALCIFEROL) 1.25 MG (50000 UNIT) PO CAPS: 50000.0000 [IU] | ORAL_CAPSULE | ORAL | 0 refills | Status: DC

## 2018-12-08 MED ORDER — DULAGLUTIDE 0.75 MG/0.5ML ~~LOC~~ SOAJ: 0.7500 mg | SUBCUTANEOUS | 0 refills | Status: DC

## 2018-12-08 MED FILL — VIT D2 1.25 MG (50,000 UNIT: 1.25 MG | 28 days supply | Qty: 4 | Fill #0

## 2018-12-08 MED FILL — TRULICITY 0.75 MG/0.5 ML PE: 0.75 | 28 days supply | Qty: 2 | Fill #0

## 2018-12-08 MED FILL — PANTOPRAZOLE SOD DR 20 MG T: 20 | 30 days supply | Qty: 30 | Fill #0

## 2018-12-08 NOTE — Progress Notes (Signed)
Office: [phone]  /  Fax: [phone]   HPI:   Chief Complaint: OBESITY Stacey Clark is here to discuss her progress with her obesity treatment plan. She is on the  keep a food journal with 1600 calories and 120-130g of protein  daily and is following her eating plan approximately 50 % of the time. She states she is exercising by walking for 30 minutes 4 times per week. Stacey Clark did well over Thanksgiving. She has not been getting meat in at dinner. She is skipping meals.   Her weight is 252 lb (114.3 kg) today and has had a weight gain of 1 pound over a period of 3 weeks since her last visit. She has lost 25 lbs since starting treatment with [place].  Vitamin D deficiency Stacey Clark has a diagnosis of vitamin D deficiency. She is currently taking vit D and denies nausea, vomiting or muscle weakness.  Ref. Range 09/02/2018 10:36  Vitamin D, 25-Hydroxy Latest Ref Range: 30.0 - 100.0 ng/mL 37.2   Hyperlipidemia Stacey Clark has hyperlipidemia and her last LDL was 111. She has been trying to improve her cholesterol levels with intensive lifestyle modification including a low saturated fat diet, exercise and weight loss. She denies any chest pain, claudication or myalgias. She is not currently on a statin, but she has diabetes. We discussed adding a statin and she would like to think about it.   Diabetes II Stacey Clark has a diagnosis of diabetes type II. Stacey Clark Kitchen Stacey Clark states random BGs range between 60 and 75 and admits to hypoglycemic episodes with episodes. Last A1c was 6.3. She has been working on intensive lifestyle modifications including diet, exercise, and weight loss to help control her blood glucose levels. She is on Trulicity. She is   Gastroesophageal Reflux Disease Stacey Clark has a diagnosis of GERD. She was placed on Protonix by GI after esophageal dilation. She has occasional reflux at night.   At risk for cardiovascular disease Stacey Clark is at a higher than average risk for cardiovascular disease due to obesity. She  currently denies any chest pain.  ALLERGIES: Allergies  Allergen Reactions  . Cephalexin Swelling  . Hydrocodone Nausea And Vomiting  . Rifampin Nausea And Vomiting, Rash and Other (See Comments)    Nausea, Vomiting  . Sulfa Antibiotics Hives  . Augmentin [Amoxicillin-Pot Clavulanate] Nausea And Vomiting  . Metformin And Related Other (See Comments)    GI upset  . Penicillins Swelling    MEDICATIONS: Current Outpatient Medications on File Prior to Visit  Medication Sig Dispense Refill  . ALPRAZolam (XANAX) 0.5 MG tablet Take 0.5 mg by mouth at bedtime as needed.    Stacey Clark Kitchen buPROPion (WELLBUTRIN SR) 150 MG 12 hr tablet Take 150 mg by mouth 2 (two) times daily.    Stacey Clark Kitchen escitalopram (LEXAPRO) 5 MG tablet Take 5 mg by mouth daily.     No current facility-administered medications on file prior to visit.     PAST MEDICAL HISTORY: Past Medical History:  Diagnosis Date  . Anxiety   . Asthma   . Back pain   . Depression   . Diabetes mellitus without complication ([place])   . GERD (gastroesophageal reflux disease)   . HLD (hyperlipidemia)   . IBS (irritable bowel syndrome)   . Joint pain   . Obesity   . Sleep apnea     PAST SURGICAL HISTORY: Past Surgical History:  Procedure Laterality Date  . CESAREAN SECTION    . TONSILLECTOMY      SOCIAL HISTORY: Social History  Tobacco Use  . Smoking status: Former Smoker    Last attempt to quit: 12/30/2006    Years since quitting: 11.9  . Smokeless tobacco: Never Used  Substance Use Topics  . Alcohol use: No    Alcohol/week: 0.0 standard drinks  . Drug use: No    FAMILY HISTORY: Family History  Problem Relation Age of Onset  . Hypertension Mother   . Cancer Mother   . Depression Mother   . Anxiety disorder Mother   . Sleep apnea Mother   . Obesity Mother   . Hyperlipidemia Father   . Cancer Father   . Depression Father   . Anxiety disorder Father   . Bipolar disorder Father   . Alcoholism Father   . Obesity Father   .  Thyroid disease Neg Hx     ROS: Review of Systems  Constitutional: Negative for weight loss.  Cardiovascular: Negative for chest pain and claudication.  Gastrointestinal: Positive for heartburn. Negative for nausea and vomiting.  Musculoskeletal: Negative for myalgias.       Negative for muscle weakness  Endo/Heme/Allergies:       Positive for hypoglycemia    PHYSICAL EXAM: Blood pressure 122/77, pulse 64, temperature 97.7 F (36.5 C), temperature source Oral, height 5\' 1"  (1.549 m), weight 252 lb (114.3 kg), SpO2 96 %. Body mass index is 47.61 kg/m. Physical Exam  Constitutional: She is oriented to person, place, and time. She appears well-developed and well-nourished.  HENT:  Head: Normocephalic.  Eyes: Pupils are equal, round, and reactive to light.  Neck: Normal range of motion.  Cardiovascular: Normal rate.  Pulmonary/Chest: Effort normal.  Musculoskeletal: Normal range of motion.  Neurological: She is alert and oriented to person, place, and time.  Skin: Skin is warm and dry.  Psychiatric: She has a normal mood and affect. Her behavior is normal.  Vitals reviewed.   RECENT LABS AND TESTS: BMET    Component Value Date/Time   NA 138 09/02/2018 1036   K 4.5 09/02/2018 1036   CL 99 09/02/2018 1036   CO2 24 09/02/2018 1036   GLUCOSE 84 09/02/2018 1036   BUN 11 09/02/2018 1036   CREATININE 0.64 09/02/2018 1036   CALCIUM 9.1 09/02/2018 1036   GFRNONAA 106 09/02/2018 1036   GFRAA 122 09/02/2018 1036   Lab Results  Component Value Date   HGBA1C 6.3 (H) 09/02/2018   HGBA1C 6.8 (H) 05/14/2018   Lab Results  Component Value Date   INSULIN 20.0 09/02/2018   INSULIN 41.6 (H) 05/14/2018   CBC    Component Value Date/Time   WBC 10.0 05/14/2018 0919   WBC 13.3 (H) 01/15/2012 1237   RBC 4.69 05/14/2018 0919   RBC 4.90 01/15/2012 1237   HGB 13.2 05/14/2018 0919   HCT 39.8 05/14/2018 0919   PLT 326 01/15/2012 1237   MCV 85 05/14/2018 0919   MCH 28.1 05/14/2018  0919   MCH 29.6 01/15/2012 1237   MCHC 33.2 05/14/2018 0919   MCHC 34.0 01/15/2012 1237   RDW 14.4 05/14/2018 0919   LYMPHSABS 2.3 05/14/2018 0919   MONOABS 0.7 01/15/2012 1237   EOSABS 0.2 05/14/2018 0919   BASOSABS 0.1 05/14/2018 0919   Iron/TIBC/Ferritin/ %Sat No results found for: IRON, TIBC, FERRITIN, IRONPCTSAT Lipid Panel     Component Value Date/Time   CHOL 182 09/02/2018 1036   TRIG 100 09/02/2018 1036   HDL 51 09/02/2018 1036   LDLCALC 111 (H) 09/02/2018 1036   Hepatic Function Panel  Component Value Date/Time   PROT 6.6 09/02/2018 1036   ALBUMIN 4.0 09/02/2018 1036   AST 11 09/02/2018 1036   ALT 11 09/02/2018 1036   ALKPHOS 78 09/02/2018 1036   BILITOT 0.3 09/02/2018 1036      Component Value Date/Time   TSH 2.360 05/14/2018 0919    Ref. Range 09/02/2018 10:36  Vitamin D, 25-Hydroxy Latest Ref Range: 30.0 - 100.0 ng/mL 37.2    ASSESSMENT AND PLAN: Vitamin D deficiency - Plan: VITAMIN D 25 Hydroxy (Vit-D Deficiency, Fractures), Vitamin D, Ergocalciferol, (DRISDOL) 1.25 MG (50000 UT) CAPS capsule  Other hyperlipidemia - Plan: Lipid Panel With LDL/HDL Ratio  Type 2 diabetes mellitus without complication, without long-term current use of insulin (HCC) - Plan: Comprehensive metabolic panel, Hemoglobin A1c, Insulin, random, Dulaglutide (TRULICITY) 1.01 BP/1.0CH SOPN  Gastroesophageal reflux disease, esophagitis presence not specified - Plan: pantoprazole (PROTONIX) 20 MG tablet  At risk for heart disease  Class 3 severe obesity with serious comorbidity and body mass index (BMI) of 45.0 to 49.9 in adult, unspecified obesity type (HCC)  PLAN: Vitamin D Deficiency Stacey Clark was informed that low vitamin D levels contributes to fatigue and are associated with obesity, breast, and colon cancer. She agrees to continue to take prescription Vit D @50 ,000 IU every week #4 with no refills and will follow up for routine testing of vitamin D, at least 2-3 times per year.  She was informed of the risk of over-replacement of vitamin D and agrees to not increase her dose unless she discusses this with [place] first. We will repeat vit D level today. Agrees to follow up with our clinic as directed.   Hyperlipidemia Stacey Clark was informed of the American Heart Association Guidelines emphasizing intensive lifestyle modifications as the first line treatment for hyperlipidemia. We discussed many lifestyle modifications today in depth, and Stacey Clark will continue to work on decreasing saturated fats such as fatty red meat, butter and many fried foods. She will also increase vegetables and lean protein in her diet and continue to work on exercise and weight loss efforts. We will repeat fasting lipid panel today. She refuses statin for now but will consider it.  Diabetes II Stacey Clark has been given extensive diabetes education by myself today including ideal fasting and post-prandial blood glucose readings, individual ideal HgA1c goals  and hypoglycemia prevention. We discussed the importance of good blood sugar control to decrease the likelihood of diabetic complications such as nephropathy, neuropathy, limb loss, blindness, coronary artery disease, and death. We discussed the importance of intensive lifestyle modification including diet, exercise and weight loss as the first line treatment for diabetes. Stacey Clark agrees to continue her diabetes medications We will decrease Trulicity to 8.52 mg subq weekly #4 pens with no refills because of hypoglycemia. She will follow up at the agreed upon time. We will repeat HgA1c, fasting glucose, and insulin today.   Gastroesophageal Reflux Disease We discussed GERD. Stacey Clark agreed to continue Protonix 20 mg daily #30 with no refills. Agrees to follow up with our clinic as directed.   Cardiovascular risk counselling Stacey Clark was given extended (15 minutes) coronary artery disease prevention counseling today. She is 47 y.o. female and has risk factors for heart disease  including obesity. We discussed intensive lifestyle modifications today with an emphasis on specific weight loss instructions and strategies. Pt was also informed of the importance of increasing exercise and decreasing saturated fats to help prevent heart disease.  Obesity Stacey Clark is currently in the action stage of change. As such, her  goal is to continue with weight loss efforts She has agreed to keep a food journal with 1600 calories and 120-130g of protein daily.  Stacey Clark has been instructed to continue walking for 30 minutes 4 time per week for weight loss and overall health benefits. We discussed the following Behavioral Modification Strategies today: work on meal planning and easy cooking plans, no skipping meals, and planning for success.    Stacey Clark has agreed to follow up with our clinic in 2-3 weeks. She was informed of the importance of frequent follow up visits to maximize her success with intensive lifestyle modifications for her multiple health conditions.   OBESITY BEHAVIORAL INTERVENTION VISIT  Today's visit was # 12   Starting weight: 277 lb Starting date: 05/14/18 Today's weight : Weight: 252 lb (114.3 kg)  Today's date: 12/08/2018 Total lbs lost to date: 25 lb At least 15 minutes were spent on discussing the following behavioral intervention visit.   ASK: We discussed the diagnosis of obesity with Stacey Clark today and Stacey Clark agreed to give [place] permission to discuss obesity behavioral modification therapy today.  ASSESS: Stacey Clark has the diagnosis of obesity and her BMI today is 47.64 Stacey Clark is in the action stage of change   ADVISE: Stacey Clark was educated on the multiple health risks of obesity as well as the benefit of weight loss to improve her health. She was advised of the need for long term treatment and the importance of lifestyle modifications to improve her current health and to decrease her risk of future health problems.  AGREE: Multiple dietary modification  options and treatment options were discussed and  Stacey Clark agreed to follow the recommendations documented in the above note.  ARRANGE: Tacie was educated on the importance of frequent visits to treat obesity as outlined per CMS and USPSTF guidelines and agreed to schedule her next follow up appointment today.  I, Renee Ramus, am acting as Location manager for [employer], FNP-C.  I have reviewed the above documentation for accuracy and completeness, and I agree with the above.  - Dawn Whitmire, FNP-C.

## 2018-12-09 LAB — COMPREHENSIVE METABOLIC PANEL
ALT: 14 IU/L (ref 0–32)
AST: 12 IU/L (ref 0–40)
Albumin/Globulin Ratio: 1.6 (ref 1.2–2.2)
Albumin: 4.2 g/dL (ref 3.5–5.5)
Alkaline Phosphatase: 87 IU/L (ref 39–117)
BUN/Creatinine Ratio: 25 — ABNORMAL HIGH (ref 9–23)
BUN: 16 mg/dL (ref 6–24)
Bilirubin Total: 0.2 mg/dL (ref 0.0–1.2)
CO2: 23 mmol/L (ref 20–29)
Calcium: 9.5 mg/dL (ref 8.7–10.2)
Chloride: 95 mmol/L — ABNORMAL LOW (ref 96–106)
Creatinine, Ser: 0.65 mg/dL (ref 0.57–1.00)
GFR calc Af Amer: 121 mL/min/{1.73_m2} (ref >59)
GFR calc non Af Amer: 105 mL/min/{1.73_m2} (ref >59)
Globulin, Total: 2.7 g/dL (ref 1.5–4.5)
Glucose: 100 mg/dL — ABNORMAL HIGH (ref 65–99)
Potassium: 4.4 mmol/L (ref 3.5–5.2)
Sodium: 133 mmol/L — ABNORMAL LOW (ref 134–144)
Total Protein: 6.9 g/dL (ref 6.0–8.5)

## 2018-12-09 LAB — HEMOGLOBIN A1C
Est. average glucose Bld gHb Est-mCnc: 131 mg/dL
Hgb A1c MFr Bld: 6.2 % — ABNORMAL HIGH (ref 4.8–5.6)

## 2018-12-09 LAB — LIPID PANEL WITH LDL/HDL RATIO
Cholesterol, Total: 234 mg/dL — ABNORMAL HIGH (ref 100–199)
HDL: 51 mg/dL (ref >39)
LDL Calculated: 153 mg/dL — ABNORMAL HIGH (ref 0–99)
LDl/HDL Ratio: 3 ratio (ref 0.0–3.2)
Triglycerides: 149 mg/dL (ref 0–149)
VLDL Cholesterol Cal: 30 mg/dL (ref 5–40)

## 2018-12-09 LAB — VITAMIN D 25 HYDROXY (VIT D DEFICIENCY, FRACTURES): Vit D, 25-Hydroxy: 35.2 ng/mL (ref 30.0–100.0)

## 2018-12-09 LAB — INSULIN, RANDOM: INSULIN: 25.8 u[IU]/mL — ABNORMAL HIGH (ref 2.6–24.9)

## 2018-12-10 DIAGNOSIS — H524 Presbyopia: Secondary | ICD-10-CM | POA: Diagnosis not present

## 2018-12-10 DIAGNOSIS — H52223 Regular astigmatism, bilateral: Secondary | ICD-10-CM | POA: Diagnosis not present

## 2018-12-10 DIAGNOSIS — H5213 Myopia, bilateral: Secondary | ICD-10-CM | POA: Diagnosis not present

## 2018-12-10 DIAGNOSIS — E119 Type 2 diabetes mellitus without complications: Secondary | ICD-10-CM | POA: Diagnosis not present

## 2018-12-10 DIAGNOSIS — H33321 Round hole, right eye: Secondary | ICD-10-CM | POA: Diagnosis not present

## 2018-12-15 ENCOUNTER — Encounter (INDEPENDENT_AMBULATORY_CARE_PROVIDER_SITE_OTHER): Payer: Self-pay | Admitting: Family Medicine

## 2018-12-31 ENCOUNTER — Ambulatory Visit (INDEPENDENT_AMBULATORY_CARE_PROVIDER_SITE_OTHER): Payer: 59 | Admitting: Family Medicine

## 2018-12-31 ENCOUNTER — Encounter (INDEPENDENT_AMBULATORY_CARE_PROVIDER_SITE_OTHER): Payer: Self-pay | Admitting: Family Medicine

## 2018-12-31 VITALS — BP 114/73 | HR 64 | Temp 97.9°F | Ht 61.0 in | Wt 256.0 lb

## 2018-12-31 DIAGNOSIS — E66813 Obesity, class 3: Secondary | ICD-10-CM

## 2018-12-31 DIAGNOSIS — Z9189 Other specified personal risk factors, not elsewhere classified: Secondary | ICD-10-CM

## 2018-12-31 DIAGNOSIS — E7849 Other hyperlipidemia: Secondary | ICD-10-CM | POA: Diagnosis not present

## 2018-12-31 DIAGNOSIS — E559 Vitamin D deficiency, unspecified: Secondary | ICD-10-CM | POA: Diagnosis not present

## 2018-12-31 DIAGNOSIS — Z6841 Body Mass Index (BMI) 40.0 and over, adult: Secondary | ICD-10-CM

## 2018-12-31 DIAGNOSIS — E119 Type 2 diabetes mellitus without complications: Secondary | ICD-10-CM | POA: Diagnosis not present

## 2018-12-31 MED ORDER — VITAMIN D (ERGOCALCIFEROL) 1.25 MG (50000 UNIT) PO CAPS: 50000.0000 [IU] | ORAL_CAPSULE | ORAL | 0 refills | Status: DC

## 2018-12-31 MED ORDER — NALTREXONE-BUPROPION HCL ER 8-90 MG PO TB12: 120.0000 mg | ORAL_TABLET | ORAL | 0 refills | Status: DC

## 2018-12-31 MED ORDER — DULAGLUTIDE 0.75 MG/0.5ML ~~LOC~~ SOAJ: 0.7500 mg | SUBCUTANEOUS | 0 refills | Status: DC

## 2018-12-31 MED ORDER — ATORVASTATIN CALCIUM 10 MG PO TABS: 10.0000 mg | ORAL_TABLET | Freq: Every day | ORAL | 0 refills | Status: DC

## 2018-12-31 MED FILL — VIT D2 1.25 MG (50,000 UNIT: 1.25 MG | 30 days supply | Qty: 10 | Fill #0

## 2018-12-31 MED FILL — ATORVASTATIN 10 MG TABLET: 10 | 30 days supply | Qty: 30 | Fill #0

## 2018-12-31 MED FILL — TRULICITY 0.75 MG/0.5 ML PE: 0.75 | 28 days supply | Qty: 2 | Fill #0

## 2018-12-31 NOTE — Progress Notes (Signed)
Office: [phone]  /  Fax: [phone]   HPI:   Chief Complaint: OBESITY Stacey Clark is here to discuss her progress with her obesity treatment plan. She is on the keep a food journal with 1600 calories and 120 to 130 grams of protein daily plan and is following her eating plan approximately 40 % of the time. She states she is walking 25 minutes 4 times per week. Stacey Clark has not been journaling, but she started back yesterday. Her weight is 256 lb (116.1 kg) today and has had a weight gain of 4 pounds over a period of 3 weeks since her last visit. She has lost 21 lbs since starting treatment with [place].  Hyperlipidemia Stacey Clark has hyperlipidemia and her last LDL was elevated at 153, despite weight loss. She has been trying to improve her cholesterol levels with intensive lifestyle modification including a low saturated fat diet, exercise and weight loss.  Stacey Clark is agreeable to starting statin. She denies any chest pain, claudication or myalgias. The 10-year ASCVD risk score Stacey Bussing DC [name], et al., 2013) is: 2.1%   Values used to calculate the score:     Age: 48 years     Sex: Female     Is Non-Hispanic African American: No     Diabetic: Yes     Tobacco smoker: No     Systolic Blood Pressure: 295 mmHg     Is BP treated: No     HDL Cholesterol: 51 mg/dL     Total Cholesterol: 234 mg/dL   Diabetes II Stacey Clark has a diagnosis of diabetes type II which is currently well controlled.  Stacey Clark states her lowest blood sugar was 73 and she denies any hypoglycemic episodes. Last A1c was at 6.2 She has been working on intensive lifestyle modifications including diet, exercise, and weight loss to help control her blood glucose levels. Stacey Clark admits to polyphagia.  At risk for cardiovascular disease Stacey Clark is at a higher than average risk for cardiovascular disease due to obesity, hyperlipidemia and diabetes. She currently denies any chest pain.  Vitamin D deficiency Stacey Clark has a diagnosis of vitamin D  deficiency. She is not at goal and her vitamin D level has decreased despite weekly prescription vit D. Patient reports good compliance. She admits to fatigue and denies nausea, vomiting or muscle weakness.  ASSESSMENT AND PLAN:  Other hyperlipidemia - Plan: atorvastatin (LIPITOR) 10 MG tablet  Type 2 diabetes mellitus without complication, without long-term current use of insulin (HCC) - Plan: Dulaglutide (TRULICITY) 6.21 HY/8.6VH SOPN  Vitamin D deficiency - Plan: Vitamin D, Ergocalciferol, (DRISDOL) 1.25 MG (50000 UT) CAPS capsule  At risk for heart disease  Class 3 severe obesity with serious comorbidity and body mass index (BMI) of 45.0 to 49.9 in adult, unspecified obesity type (HCC) - Plan: Naltrexone-buPROPion HCl ER 8-90 MG TB12  PLAN:  Hyperlipidemia Stacey Clark was informed of the American Heart Association Guidelines emphasizing intensive lifestyle modifications as the first line treatment for hyperlipidemia. We discussed many lifestyle modifications today in depth, and Stacey Clark will continue to work on decreasing saturated fats such as fatty red meat, butter and many fried foods. She will also increase vegetables and lean protein in her diet and continue to work on exercise and weight loss efforts. Stacey Clark agrees to start atorvastatin 10 mg qHS #30 with no refills and follow up as directed.  Diabetes II Stacey Clark has been given extensive diabetes education by myself today including ideal fasting and post-prandial blood glucose readings, individual ideal Hgb A1c  goals and hypoglycemia prevention. We discussed the importance of good blood sugar control to decrease the likelihood of diabetic complications such as nephropathy, neuropathy, limb loss, blindness, coronary artery disease, and death. We discussed the importance of intensive lifestyle modification including diet, exercise and weight loss as the first line treatment for diabetes. Stacey Clark agrees to continue Trulicity 75 mg weekly #4 with no  refills and follow up at the agreed upon time.  Cardiovascular risk counseling Stacey Clark was given extended (15 minutes) coronary artery disease prevention counseling today. She is 48 y.o. female and has risk factors for heart disease including obesity, hyperlipidemia and diabetes. We discussed intensive lifestyle modifications today with an emphasis on specific weight loss instructions and strategies. Pt was also informed of the importance of increasing exercise and decreasing saturated fats to help prevent heart disease.  Vitamin D Deficiency Stacey Clark was informed that low vitamin D levels contributes to fatigue and are associated with obesity, breast, and colon cancer. She agrees to increase prescription Vit D @50 ,000 IU to every 3 days #10 with no refills and follow up for routine testing of vitamin D, at least 2-3 times per year. She was informed of the risk of over-replacement of vitamin D and agrees to not increase her dose unless she discusses this with [place] first. Stacey Clark agrees to follow up with our clinic in 2 weeks.  Obesity Stacey Clark is currently in the action stage of change. As such, her goal is to continue with weight loss efforts She has agreed to keep a food journal with 1600 calories and 120 to 130 grams of protein daily Stacey Clark will continue walking for 25 minutes 4 times per week for weight loss and overall health benefits. We discussed the following Behavioral Modification Strategies today: planning for success and keep a strict food journal. We discussed various medication options to help Stacey Clark with her weight loss efforts and we both agreed to start Contrave (8-90 mg) 2 pills BID #120 with no refills (patient to take pill BID).  Stacey Clark has agreed to follow up with our clinic in 2 weeks. She was informed of the importance of frequent follow up visits to maximize her success with intensive lifestyle modifications for her multiple health conditions.  ALLERGIES: Allergies  Allergen Reactions    . Cephalexin Swelling  . Hydrocodone Nausea And Vomiting  . Rifampin Nausea And Vomiting, Rash and Other (See Comments)    Nausea, Vomiting  . Sulfa Antibiotics Hives  . Augmentin [Amoxicillin-Pot Clavulanate] Nausea And Vomiting  . Metformin And Related Other (See Comments)    GI upset  . Penicillins Swelling    MEDICATIONS: Current Outpatient Medications on File Prior to Visit  Medication Sig Dispense Refill  . ALPRAZolam (XANAX) 0.5 MG tablet Take 0.5 mg by mouth at bedtime as needed.    Marland Kitchen buPROPion (WELLBUTRIN SR) 150 MG 12 hr tablet Take 150 mg by mouth 2 (two) times daily.    Marland Kitchen escitalopram (LEXAPRO) 5 MG tablet Take 5 mg by mouth daily.    . pantoprazole (PROTONIX) 20 MG tablet Take 1 tablet (20 mg total) by mouth daily. 30 tablet 0   No current facility-administered medications on file prior to visit.     PAST MEDICAL HISTORY: Past Medical History:  Diagnosis Date  . Anxiety   . Asthma   . Back pain   . Depression   . Diabetes mellitus without complication ([place])   . GERD (gastroesophageal reflux disease)   . HLD (hyperlipidemia)   . IBS (irritable  bowel syndrome)   . Joint pain   . Obesity   . Sleep apnea     PAST SURGICAL HISTORY: Past Surgical History:  Procedure Laterality Date  . CESAREAN SECTION    . TONSILLECTOMY      SOCIAL HISTORY: Social History   Tobacco Use  . Smoking status: Former Smoker    Last attempt to quit: 12/30/2006    Years since quitting: 12.0  . Smokeless tobacco: Never Used  Substance Use Topics  . Alcohol use: No    Alcohol/week: 0.0 standard drinks  . Drug use: No    FAMILY HISTORY: Family History  Problem Relation Age of Onset  . Hypertension Mother   . Cancer Mother   . Depression Mother   . Anxiety disorder Mother   . Sleep apnea Mother   . Obesity Mother   . Hyperlipidemia Father   . Cancer Father   . Depression Father   . Anxiety disorder Father   . Bipolar disorder Father   . Alcoholism Father   .  Obesity Father   . Thyroid disease Neg Hx     ROS: Review of Systems  Constitutional: Positive for malaise/fatigue. Negative for weight loss.  Cardiovascular: Negative for chest pain and claudication.  Gastrointestinal: Negative for nausea and vomiting.  Musculoskeletal: Negative for myalgias.       Negative for muscle weakness  Endo/Heme/Allergies:       Positive for polyphagia  Negative for hypoglycemia    PHYSICAL EXAM: Blood pressure 114/73, pulse 64, temperature 97.9 F (36.6 C), temperature source Oral, height 5\' 1"  (1.549 m), weight 256 lb (116.1 kg), SpO2 97 %. Body mass index is 48.37 kg/m. Physical Exam Vitals signs reviewed.  Constitutional:      Appearance: Normal appearance. She is well-developed. She is obese.  Cardiovascular:     Rate and Rhythm: Normal rate.  Pulmonary:     Effort: Pulmonary effort is normal.  Musculoskeletal: Normal range of motion.  Skin:    General: Skin is warm and dry.  Neurological:     Mental Status: She is alert and oriented to person, place, and time.  Psychiatric:        Mood and Affect: Mood normal.        Behavior: Behavior normal.     RECENT LABS AND TESTS: BMET    Component Value Date/Time   NA 133 (L) 12/08/2018 1031   K 4.4 12/08/2018 1031   CL 95 (L) 12/08/2018 1031   CO2 23 12/08/2018 1031   GLUCOSE 100 (H) 12/08/2018 1031   BUN 16 12/08/2018 1031   CREATININE 0.65 12/08/2018 1031   CALCIUM 9.5 12/08/2018 1031   GFRNONAA 105 12/08/2018 1031   GFRAA 121 12/08/2018 1031   Lab Results  Component Value Date   HGBA1C 6.2 (H) 12/08/2018   HGBA1C 6.3 (H) 09/02/2018   HGBA1C 6.8 (H) 05/14/2018   Lab Results  Component Value Date   INSULIN 25.8 (H) 12/08/2018   INSULIN 20.0 09/02/2018   INSULIN 41.6 (H) 05/14/2018   CBC    Component Value Date/Time   WBC 10.0 05/14/2018 0919   WBC 13.3 (H) 01/15/2012 1237   RBC 4.69 05/14/2018 0919   RBC 4.90 01/15/2012 1237   HGB 13.2 05/14/2018 0919   HCT 39.8  05/14/2018 0919   PLT 326 01/15/2012 1237   MCV 85 05/14/2018 0919   MCH 28.1 05/14/2018 0919   MCH 29.6 01/15/2012 1237   MCHC 33.2 05/14/2018 0919   MCHC 34.0  01/15/2012 1237   RDW 14.4 05/14/2018 0919   LYMPHSABS 2.3 05/14/2018 0919   MONOABS 0.7 01/15/2012 1237   EOSABS 0.2 05/14/2018 0919   BASOSABS 0.1 05/14/2018 0919   Iron/TIBC/Ferritin/ %Sat No results found for: IRON, TIBC, FERRITIN, IRONPCTSAT Lipid Panel     Component Value Date/Time   CHOL 234 (H) 12/08/2018 1031   TRIG 149 12/08/2018 1031   HDL 51 12/08/2018 1031   LDLCALC 153 (H) 12/08/2018 1031   Hepatic Function Panel     Component Value Date/Time   PROT 6.9 12/08/2018 1031   ALBUMIN 4.2 12/08/2018 1031   AST 12 12/08/2018 1031   ALT 14 12/08/2018 1031   ALKPHOS 87 12/08/2018 1031   BILITOT 0.2 12/08/2018 1031      Component Value Date/Time   TSH 2.360 05/14/2018 0919   Results for ALIJAH, HYDE (MRN [id number]) as of 12/31/2018 14:13  Ref. Range 12/08/2018 10:31  Vitamin D, 25-Hydroxy Latest Ref Range: 30.0 - 100.0 ng/mL 35.2     OBESITY BEHAVIORAL INTERVENTION VISIT  Today's visit was # 13   Starting weight: 277 lbs Starting date: 05/14/2018 Today's weight : 256 lbs Today's date: 12/31/2018 Total lbs lost to date: 21   ASK: We discussed the diagnosis of obesity with Stacey Clark today and Eunice agreed to give [place] permission to discuss obesity behavioral modification therapy today.  ASSESS: Mackenze has the diagnosis of obesity and her BMI today is 48.4 Bellamarie is in the action stage of change   ADVISE: Metha was educated on the multiple health risks of obesity as well as the benefit of weight loss to improve her health. She was advised of the need for long term treatment and the importance of lifestyle modifications to improve her current health and to decrease her risk of future health problems.  AGREE: Multiple dietary modification options and treatment options were discussed and  Stacey Clark  agreed to follow the recommendations documented in the above note.  ARRANGE: Stacey Clark was educated on the importance of frequent visits to treat obesity as outlined per CMS and USPSTF guidelines and agreed to schedule her next follow up appointment today.  Corey Skains, am acting as Location manager for [employer], FNP-C.  I have reviewed the above documentation for accuracy and completeness, and I agree with the above.  - Silas Muff, FNP-C.

## 2019-01-02 ENCOUNTER — Ambulatory Visit (INDEPENDENT_AMBULATORY_CARE_PROVIDER_SITE_OTHER): Payer: Self-pay | Admitting: Nurse Practitioner

## 2019-01-02 ENCOUNTER — Encounter: Payer: Self-pay | Admitting: Nurse Practitioner

## 2019-01-02 VITALS — BP 110/70 | HR 69 | Temp 98.7°F | Resp 10 | Wt 260.4 lb

## 2019-01-02 DIAGNOSIS — J329 Chronic sinusitis, unspecified: Secondary | ICD-10-CM

## 2019-01-02 DIAGNOSIS — B9789 Other viral agents as the cause of diseases classified elsewhere: Secondary | ICD-10-CM

## 2019-01-02 MED ORDER — CETIRIZINE HCL 10 MG PO TABS: 10.0000 mg | ORAL_TABLET | Freq: Every day | ORAL | 0 refills | Status: DC

## 2019-01-02 MED ORDER — FLUTICASONE PROPIONATE 50 MCG/ACT NA SUSP: 2.0000 | Freq: Every day | NASAL | 0 refills | Status: DC

## 2019-01-02 NOTE — Patient Instructions (Signed)
Sinusitis, Adult -Take medication as prescribed. -Ibuprofen or Tylenol for pain, fever, or general discomfort. -Increase fluids. -Sleep elevated on at least 2 pillows at bedtime to help with cough. -Use a humidifier or vaporizer when at home and during sleep. -May use a teaspoon of honey or over-the-counter cough drops to help with cough. -May use normal saline nasal spray to help with nasal congestion throughout the day. -Follow-up if symptoms do not improve within the next 10-14 days.   Sinusitis is inflammation of your sinuses. Sinuses are hollow spaces in the bones around your face. Your sinuses are located:  Around your eyes.  In the middle of your forehead.  Behind your nose.  In your cheekbones. Mucus normally drains out of your sinuses. When your nasal tissues become inflamed or swollen, mucus can become trapped or blocked. This allows bacteria, viruses, and fungi to grow, which leads to infection. Most infections of the sinuses are caused by a virus. Sinusitis can develop quickly. It can last for up to 4 weeks (acute) or for more than 12 weeks (chronic). Sinusitis often develops after a cold. What are the causes? This condition is caused by anything that creates swelling in the sinuses or stops mucus from draining. This includes:  Allergies.  Asthma.  Infection from bacteria or viruses.  Deformities or blockages in your nose or sinuses.  Abnormal growths in the nose (nasal polyps).  Pollutants, such as chemicals or irritants in the air.  Infection from fungi (rare). What increases the risk? You are more likely to develop this condition if you:  Have a weak body defense system (immune system).  Do a lot of swimming or diving.  Overuse nasal sprays.  Smoke. What are the signs or symptoms? The main symptoms of this condition are pain and a feeling of pressure around the affected sinuses. Other symptoms include:  Stuffy nose or congestion.  Thick drainage  from your nose.  Swelling and warmth over the affected sinuses.  Headache.  Upper toothache.  A cough that may get worse at night.  Extra mucus that collects in the throat or the back of the nose (postnasal drip).  Decreased sense of smell and taste.  Fatigue.  A fever.  Sore throat.  Bad breath. How is this diagnosed? This condition is diagnosed based on:  Your symptoms.  Your medical history.  A physical exam.  Tests to find out if your condition is acute or chronic. This may include: ? Checking your nose for nasal polyps. ? Viewing your sinuses using a device that has a light (endoscope). ? Testing for allergies or bacteria. ? Imaging tests, such as an MRI or CT scan. In rare cases, a bone biopsy may be done to rule out more serious types of fungal sinus disease. How is this treated? Treatment for sinusitis depends on the cause and whether your condition is chronic or acute.  If caused by a virus, your symptoms should go away on their own within 10 days. You may be given medicines to relieve symptoms. They include: ? Medicines that shrink swollen nasal passages (topical intranasal decongestants). ? Medicines that treat allergies (antihistamines). ? A spray that eases inflammation of the nostrils (topical intranasal corticosteroids). ? Rinses that help get rid of thick mucus in your nose (nasal saline washes).  If caused by bacteria, your health care provider may recommend waiting to see if your symptoms improve. Most bacterial infections will get better without antibiotic medicine. You may be given antibiotics if you have: ?  A severe infection. ? A weak immune system.  If caused by narrow nasal passages or nasal polyps, you may need to have surgery. Follow these instructions at home: Medicines  Take, use, or apply over-the-counter and prescription medicines only as told by your health care provider. These may include nasal sprays.  If you were prescribed an  antibiotic medicine, take it as told by your health care provider. Do not stop taking the antibiotic even if you start to feel better. Hydrate and humidify   Drink enough fluid to keep your urine pale yellow. Staying hydrated will help to thin your mucus.  Use a cool mist humidifier to keep the humidity level in your home above 50%.  Inhale steam for 10-15 minutes, 3-4 times a day, or as told by your health care provider. You can do this in the bathroom while a hot shower is running.  Limit your exposure to cool or dry air. Rest  Rest as much as possible.  Sleep with your head raised (elevated).  Make sure you get enough sleep each night. General instructions   Apply a warm, moist washcloth to your face 3-4 times a day or as told by your health care provider. This will help with discomfort.  Wash your hands often with soap and water to reduce your exposure to germs. If soap and water are not available, use hand sanitizer.  Do not smoke. Avoid being around people who are smoking (secondhand smoke).  Keep all follow-up visits as told by your health care provider. This is important. Contact a health care provider if:  You have a fever.  Your symptoms get worse.  Your symptoms do not improve within 10 days. Get help right away if:  You have a severe headache.  You have persistent vomiting.  You have severe pain or swelling around your face or eyes.  You have vision problems.  You develop confusion.  Your neck is stiff.  You have trouble breathing. Summary  Sinusitis is soreness and inflammation of your sinuses. Sinuses are hollow spaces in the bones around your face.  This condition is caused by nasal tissues that become inflamed or swollen. The swelling traps or blocks the flow of mucus. This allows bacteria, viruses, and fungi to grow, which leads to infection.  If you were prescribed an antibiotic medicine, take it as told by your health care provider. Do not  stop taking the antibiotic even if you start to feel better.  Keep all follow-up visits as told by your health care provider. This is important. This information is not intended to replace advice given to you by your health care provider. Make sure you discuss any questions you have with your health care provider. Document Released: 12/16/2005 Document Revised: 05/18/2018 Document Reviewed: 05/18/2018 Elsevier Interactive Patient Education  2019 [employer].

## 2019-01-02 NOTE — Progress Notes (Signed)
Subjective:  Stacey Clark is a 48 y.o. female who presents for evaluation of possible sinusitis.  Symptoms include left ear pressure/pain, nasal congestion and rhinorrhea.  Onset of symptoms was 1 day ago, and has been unchanged since that time.  Treatment to date:  none.  High risk factors for influenza complications:  co-morbid illness.  The following portions of the patient's history were reviewed and updated as appropriate:  allergies, current medications and past medical history.  Constitutional: negative Eyes: positive for watery eyes, negative for icterus, irritation, redness and visual disturbance Ears, nose, mouth, throat, and face: positive for nasal congestion and left ear pressure, negative for ear drainage, earaches and sore throat Respiratory: negative Cardiovascular: negative Gastrointestinal: negative Neurological: positive for headaches, negative for coordination problems, dizziness, paresthesia, seizures and weakness Allergic/Immunologic: negative Objective:  BP 110/70   Pulse 69   Temp 98.7 F (37.1 C)   Resp 10   Wt 260 lb 6.4 oz (118.1 kg)   SpO2 98%   BMI 49.20 kg/m   General appearance: alert, cooperative and no distress Head: Normocephalic, without obvious abnormality, atraumatic Eyes: conjunctivae/corneas clear. PERRL, EOM's intact. Fundi benign. Ears: normal TM and external ear canal right ear and abnormal TM left ear - mucoid middle ear fluid Nose: no discharge, mild congestion, turbinates swollen, inflamed, mild maxillary sinus tenderness left, mild frontal sinus tenderness left Throat: lips, mucosa, and tongue normal; teeth and gums normal Lungs: clear to auscultation bilaterally Heart: regular rate and rhythm, S1, S2 normal, no murmur, click, rub or gallop Abdomen: soft, non-tender; bowel sounds normal; no masses,  no organomegaly Pulses: 2+ and symmetric Skin: Skin color, texture, turgor normal. No rashes or lesions Lymph nodes: cervical and  submandibular nodes normal Neurologic: Grossly normal, no cranial nerve deficits   Assessment:  Viral Sinusitis    Plan:  Exam findings, diagnosis etiology and medication use and indications reviewed with patient. Follow- Up and discharge instructions provided. No emergent/urgent issues found on exam.  Based on the patient's clinical presentation, physical assessment, symptoms, and duration of symptoms, patient symptoms are most likely of viral etiology.  Patient does not display any fever, vital signs are stable, no purulent nasal drainage, and there is a short duration of symptoms.  Informed patient we will treat her current symptoms with symptomatic treatment.  Informed patient that if symptoms do not improve within the next 10 to 14 days to return for follow-up.  Patient education was provided. Patient verbalized understanding of information provided and agrees with plan of care (POC), all questions answered. The patient is advised to call or return to clinic if condition does not see an improvement in symptoms, or to seek the care of the closest emergency department if condition worsens with the above plan.

## 2019-01-04 ENCOUNTER — Encounter (INDEPENDENT_AMBULATORY_CARE_PROVIDER_SITE_OTHER): Payer: Self-pay | Admitting: Family Medicine

## 2019-01-04 DIAGNOSIS — E559 Vitamin D deficiency, unspecified: Secondary | ICD-10-CM | POA: Insufficient documentation

## 2019-01-07 ENCOUNTER — Encounter (INDEPENDENT_AMBULATORY_CARE_PROVIDER_SITE_OTHER): Payer: Self-pay

## 2019-01-07 MED FILL — CONTRAVE ER 8-90 MG TABLET: 8-90 | 28 days supply | Qty: 70 | Fill #0

## 2019-01-12 MED FILL — ESCITALOPRAM 20 MG TABLET: 20 | 90 days supply | Qty: 90 | Fill #1

## 2019-01-14 ENCOUNTER — Ambulatory Visit (INDEPENDENT_AMBULATORY_CARE_PROVIDER_SITE_OTHER): Payer: 59 | Admitting: Family Medicine

## 2019-01-14 VITALS — BP 107/73 | HR 60 | Temp 97.9°F | Ht 61.0 in | Wt 257.0 lb

## 2019-01-14 DIAGNOSIS — K219 Gastro-esophageal reflux disease without esophagitis: Secondary | ICD-10-CM | POA: Diagnosis not present

## 2019-01-14 DIAGNOSIS — Z6841 Body Mass Index (BMI) 40.0 and over, adult: Secondary | ICD-10-CM

## 2019-01-14 DIAGNOSIS — Z9189 Other specified personal risk factors, not elsewhere classified: Secondary | ICD-10-CM

## 2019-01-14 DIAGNOSIS — E559 Vitamin D deficiency, unspecified: Secondary | ICD-10-CM

## 2019-01-14 MED ORDER — PANTOPRAZOLE SODIUM 20 MG PO TBEC: 20.0000 mg | DELAYED_RELEASE_TABLET | Freq: Every day | ORAL | 0 refills | Status: DC

## 2019-01-14 MED ORDER — VITAMIN D (ERGOCALCIFEROL) 1.25 MG (50000 UNIT) PO CAPS: 50000.0000 [IU] | ORAL_CAPSULE | ORAL | 0 refills | Status: DC

## 2019-01-14 MED FILL — PANTOPRAZOLE SOD DR 20 MG T: 20 | 30 days supply | Qty: 30 | Fill #0

## 2019-01-15 NOTE — Progress Notes (Signed)
Office: [phone]  /  Fax: [phone]   HPI:   Chief Complaint: OBESITY Stacey Clark is here to discuss her progress with her obesity treatment plan. She is on the keep a food journal with 1600 calories and 120-130 grams of protein daily and is following her eating plan approximately 25% of the time. She states she is walking and in the sauna for 30 minutes 2-3 times per week. Stacey Clark is taking Contrave 1 tablet in the morning and she states it is helping with appetite.  Her weight is 257 lb (116.6 kg) today and has gained 1 pound since her last visit. She has lost 20 lbs since starting treatment with [place].  Vitamin D Deficiency Stacey Clark has a diagnosis of vitamin D deficiency. She is currently taking prescription Vit D, but level is not at goal. She denies nausea, vomiting or muscle weakness.  At risk for osteopenia and osteoporosis Stacey Clark is at higher risk of osteopenia and osteoporosis due to vitamin D deficiency.   GERD with Esophagitis Stacey Clark denies heartburn or coughing. She had night time reflux in the past which led to stricture, which had to be dilated.  ALLERGIES: Allergies  Allergen Reactions  . Cephalexin Swelling  . Hydrocodone Nausea And Vomiting  . Rifampin Nausea And Vomiting, Rash and Other (See Comments)    Nausea, Vomiting  . Sulfa Antibiotics Hives  . Augmentin [Amoxicillin-Pot Clavulanate] Nausea And Vomiting  . Metformin And Related Other (See Comments)    GI upset  . Penicillins Swelling    MEDICATIONS: Current Outpatient Medications on File Prior to Visit  Medication Sig Dispense Refill  . ALPRAZolam (XANAX) 0.5 MG tablet Take 0.5 mg by mouth at bedtime as needed.    Marland Kitchen atorvastatin (LIPITOR) 10 MG tablet Take 1 tablet (10 mg total) by mouth daily. 30 tablet 0  . cetirizine (ZYRTEC) 10 MG tablet Take 1 tablet (10 mg total) by mouth daily. 30 tablet 0  . Dulaglutide (TRULICITY) 6.57 QI/6.9GE SOPN Inject 0.75 mg into the skin once a week. 4 pen 0  . escitalopram  (LEXAPRO) 5 MG tablet Take 5 mg by mouth daily.    . Naltrexone-buPROPion HCl ER 8-90 MG TB12 Take 120 mg by mouth as directed. Take two tabs twice daily 120 tablet 0  . buPROPion (WELLBUTRIN SR) 150 MG 12 hr tablet Take 150 mg by mouth 2 (two) times daily.     No current facility-administered medications on file prior to visit.     PAST MEDICAL HISTORY: Past Medical History:  Diagnosis Date  . Anxiety   . Asthma   . Back pain   . Depression   . Diabetes mellitus without complication ([place])   . GERD (gastroesophageal reflux disease)   . HLD (hyperlipidemia)   . IBS (irritable bowel syndrome)   . Joint pain   . Obesity   . Sleep apnea     PAST SURGICAL HISTORY: Past Surgical History:  Procedure Laterality Date  . CESAREAN SECTION    . TONSILLECTOMY      SOCIAL HISTORY: Social History   Tobacco Use  . Smoking status: Former Smoker    Last attempt to quit: 12/30/2006    Years since quitting: 12.0  . Smokeless tobacco: Never Used  Substance Use Topics  . Alcohol use: No    Alcohol/week: 0.0 standard drinks  . Drug use: No    FAMILY HISTORY: Family History  Problem Relation Age of Onset  . Hypertension Mother   . Cancer Mother   .  Depression Mother   . Anxiety disorder Mother   . Sleep apnea Mother   . Obesity Mother   . Hyperlipidemia Father   . Cancer Father   . Depression Father   . Anxiety disorder Father   . Bipolar disorder Father   . Alcoholism Father   . Obesity Father   . Thyroid disease Neg Hx     ROS: Review of Systems  Constitutional: Negative for weight loss.  Gastrointestinal: Negative for heartburn, nausea and vomiting.  Musculoskeletal:       Negative muscle weakness    PHYSICAL EXAM: Blood pressure 107/73, pulse 60, temperature 97.9 F (36.6 C), temperature source Oral, height 5\' 1"  (1.549 m), weight 257 lb (116.6 kg), SpO2 95 %. Body mass index is 48.56 kg/m. Physical Exam Vitals signs reviewed.  Constitutional:      Appearance:  Normal appearance. She is obese.  Cardiovascular:     Rate and Rhythm: Normal rate.     Pulses: Normal pulses.  Pulmonary:     Effort: Pulmonary effort is normal.     Breath sounds: Normal breath sounds.  Musculoskeletal: Normal range of motion.  Skin:    General: Skin is warm and dry.  Neurological:     Mental Status: She is alert and oriented to person, place, and time.  Psychiatric:        Mood and Affect: Mood normal.        Behavior: Behavior normal.     RECENT LABS AND TESTS: BMET    Component Value Date/Time   NA 133 (L) 12/08/2018 1031   K 4.4 12/08/2018 1031   CL 95 (L) 12/08/2018 1031   CO2 23 12/08/2018 1031   GLUCOSE 100 (H) 12/08/2018 1031   BUN 16 12/08/2018 1031   CREATININE 0.65 12/08/2018 1031   CALCIUM 9.5 12/08/2018 1031   GFRNONAA 105 12/08/2018 1031   GFRAA 121 12/08/2018 1031   Lab Results  Component Value Date   HGBA1C 6.2 (H) 12/08/2018   HGBA1C 6.3 (H) 09/02/2018   HGBA1C 6.8 (H) 05/14/2018   Lab Results  Component Value Date   INSULIN 25.8 (H) 12/08/2018   INSULIN 20.0 09/02/2018   INSULIN 41.6 (H) 05/14/2018   CBC    Component Value Date/Time   WBC 10.0 05/14/2018 0919   WBC 13.3 (H) 01/15/2012 1237   RBC 4.69 05/14/2018 0919   RBC 4.90 01/15/2012 1237   HGB 13.2 05/14/2018 0919   HCT 39.8 05/14/2018 0919   PLT 326 01/15/2012 1237   MCV 85 05/14/2018 0919   MCH 28.1 05/14/2018 0919   MCH 29.6 01/15/2012 1237   MCHC 33.2 05/14/2018 0919   MCHC 34.0 01/15/2012 1237   RDW 14.4 05/14/2018 0919   LYMPHSABS 2.3 05/14/2018 0919   MONOABS 0.7 01/15/2012 1237   EOSABS 0.2 05/14/2018 0919   BASOSABS 0.1 05/14/2018 0919   Iron/TIBC/Ferritin/ %Sat No results found for: IRON, TIBC, FERRITIN, IRONPCTSAT Lipid Panel     Component Value Date/Time   CHOL 234 (H) 12/08/2018 1031   TRIG 149 12/08/2018 1031   HDL 51 12/08/2018 1031   LDLCALC 153 (H) 12/08/2018 1031   Hepatic Function Panel     Component Value Date/Time   PROT 6.9  12/08/2018 1031   ALBUMIN 4.2 12/08/2018 1031   AST 12 12/08/2018 1031   ALT 14 12/08/2018 1031   ALKPHOS 87 12/08/2018 1031   BILITOT 0.2 12/08/2018 1031      Component Value Date/Time   TSH 2.360 05/14/2018 0919  ASSESSMENT AND PLAN: Vitamin D deficiency - Plan: Vitamin D, Ergocalciferol, (DRISDOL) 1.25 MG (50000 UT) CAPS capsule  Gastroesophageal reflux disease, esophagitis presence not specified - Plan: pantoprazole (PROTONIX) 20 MG tablet  At risk for osteoporosis  Class 3 severe obesity with serious comorbidity and body mass index (BMI) of 45.0 to 49.9 in adult, unspecified obesity type ([place])  PLAN:  Vitamin D Deficiency Marletta was informed that low vitamin D levels contributes to fatigue and are associated with obesity, breast, and colon cancer. Byrdie agrees to continue taking prescription Vit D @50 ,000 IU every 3 days #10 and we will refill for 1 month. She will follow up for routine testing of vitamin D, at least 2-3 times per year. She was informed of the risk of over-replacement of vitamin D and agrees to not increase her dose unless she discusses this with [place] first. Sonnet agrees to follow up with our clinic in 3 weeks.  At risk for osteopenia and osteoporosis Stacey Clark was given extended (15 minutes) osteoporosis prevention counseling today. Stacey Clark is at risk for osteopenia and osteoporsis due to her vitamin D deficiency. She was encouraged to take her vitamin D and follow her higher calcium diet and increase strengthening exercise to help strengthen her bones and decrease her risk of osteopenia and osteoporosis.  GERD with Esophagitis Stacey Clark agrees to continue taking pantoprazole 20 mg daily #30 and we will refill for 1 month. Stacey Clark agrees to follow up with our clinic in 3 weeks.  Obesity Stacey Clark is currently in the action stage of change. As such, her goal is to continue with weight loss efforts She has agreed to follow the Category 3 plan Stacey Clark will continue current  exercise regimen for weight loss and overall health benefits. We discussed the following Behavioral Modification Strategies today: increasing lean protein intake, no skipping meals, and planning for success We discussed various medication options to help Stacey Clark with her weight loss efforts and we both agreed to Stacey Clark agrees to increase Contrave to BID this weekend. She will go back to doing Category 3.   Stacey Clark has agreed to follow up with our clinic in 3 weeks. She was informed of the importance of frequent follow up visits to maximize her success with intensive lifestyle modifications for her multiple health conditions.   OBESITY BEHAVIORAL INTERVENTION VISIT  Today's visit was # 14  Starting weight: 277 lbs Starting date: 05/14/18 Today's weight : 257 lbs Today's date: 01/14/2019 Total lbs lost to date: 17    ASK: We discussed the diagnosis of obesity with Stacey Clark today and Korah agreed to give [place] permission to discuss obesity behavioral modification therapy today.  ASSESS: Stacey Clark has the diagnosis of obesity and her BMI today is 48.58 Stacey Clark is in the action stage of change   ADVISE: Kenzlee was educated on the multiple health risks of obesity as well as the benefit of weight loss to improve her health. She was advised of the need for long term treatment and the importance of lifestyle modifications to improve her current health and to decrease her risk of future health problems.  AGREE: Multiple dietary modification options and treatment options were discussed and  Stacey Clark agreed to follow the recommendations documented in the above note.  ARRANGE: Stacey Clark was educated on the importance of frequent visits to treat obesity as outlined per CMS and USPSTF guidelines and agreed to schedule her next follow up appointment today.  Wilhemena Durie, am acting as Location manager for [employer], FNP-C.  I  have reviewed the above documentation for accuracy and completeness, and I agree  with the above.  - Landen Breeland, FNP-C.

## 2019-01-19 ENCOUNTER — Encounter (INDEPENDENT_AMBULATORY_CARE_PROVIDER_SITE_OTHER): Payer: Self-pay | Admitting: Family Medicine

## 2019-01-19 DIAGNOSIS — K219 Gastro-esophageal reflux disease without esophagitis: Secondary | ICD-10-CM | POA: Insufficient documentation

## 2019-01-25 ENCOUNTER — Encounter (INDEPENDENT_AMBULATORY_CARE_PROVIDER_SITE_OTHER): Payer: Self-pay | Admitting: Family Medicine

## 2019-01-25 ENCOUNTER — Other Ambulatory Visit (INDEPENDENT_AMBULATORY_CARE_PROVIDER_SITE_OTHER): Payer: Self-pay

## 2019-01-25 DIAGNOSIS — M5441 Lumbago with sciatica, right side: Secondary | ICD-10-CM | POA: Diagnosis not present

## 2019-01-25 DIAGNOSIS — E782 Mixed hyperlipidemia: Secondary | ICD-10-CM | POA: Diagnosis not present

## 2019-01-25 DIAGNOSIS — E7849 Other hyperlipidemia: Secondary | ICD-10-CM

## 2019-01-25 MED ORDER — ATORVASTATIN CALCIUM 10 MG PO TABS: 10.0000 mg | ORAL_TABLET | Freq: Every day | ORAL | 0 refills | Status: DC

## 2019-01-25 MED FILL — CYCLOBENZAPRINE 10 MG TAB: 10 | 30 days supply | Qty: 30 | Fill #0

## 2019-01-25 MED FILL — ATORVASTATIN 10 MG TABLET: 10 | 30 days supply | Qty: 30 | Fill #0

## 2019-01-25 MED FILL — MELOXICAM 15 MG TABLET: 15 | 30 days supply | Qty: 30 | Fill #0

## 2019-02-01 ENCOUNTER — Ambulatory Visit (INDEPENDENT_AMBULATORY_CARE_PROVIDER_SITE_OTHER): Payer: 59 | Admitting: Family Medicine

## 2019-02-01 ENCOUNTER — Encounter (INDEPENDENT_AMBULATORY_CARE_PROVIDER_SITE_OTHER): Payer: Self-pay | Admitting: Family Medicine

## 2019-02-01 VITALS — BP 125/81 | HR 60 | Temp 97.9°F | Ht 61.0 in | Wt 250.0 lb

## 2019-02-01 DIAGNOSIS — Z6841 Body Mass Index (BMI) 40.0 and over, adult: Secondary | ICD-10-CM | POA: Diagnosis not present

## 2019-02-01 DIAGNOSIS — E559 Vitamin D deficiency, unspecified: Secondary | ICD-10-CM | POA: Diagnosis not present

## 2019-02-01 DIAGNOSIS — Z9189 Other specified personal risk factors, not elsewhere classified: Secondary | ICD-10-CM | POA: Diagnosis not present

## 2019-02-01 DIAGNOSIS — E119 Type 2 diabetes mellitus without complications: Secondary | ICD-10-CM | POA: Diagnosis not present

## 2019-02-01 MED ORDER — NALTREXONE-BUPROPION HCL ER 8-90 MG PO TB12: 120.0000 mg | ORAL_TABLET | ORAL | 0 refills | Status: DC

## 2019-02-01 MED ORDER — DULAGLUTIDE 0.75 MG/0.5ML ~~LOC~~ SOAJ: 0.7500 mg | SUBCUTANEOUS | 0 refills | Status: DC

## 2019-02-01 MED ORDER — VITAMIN D (ERGOCALCIFEROL) 1.25 MG (50000 UNIT) PO CAPS: 50000.0000 [IU] | ORAL_CAPSULE | ORAL | 0 refills | Status: DC

## 2019-02-01 MED FILL — VIT D2 1.25 MG (50,000 UNIT: 1.25 MG | 30 days supply | Qty: 10 | Fill #0

## 2019-02-01 MED FILL — TRULICITY 0.75 MG/0.5 ML PE: 0.75 | 28 days supply | Qty: 2 | Fill #0

## 2019-02-01 NOTE — Progress Notes (Signed)
Office: [phone]  /  Fax: [phone]   HPI:   Chief Complaint: OBESITY Stacey Clark is here to discuss her progress with her obesity treatment plan. She is on the Category 3 plan and is following her eating plan approximately 90 % of the time. She states she is exercising 0 minutes 0 times per week. Stacey Clark is taking 2 Contrave pills BID and it is helping with appetite and cravings. She is eating all her food. She is traveling to [place] this week to see her daughter.  Her weight is 250 lb (113.4 kg) today and has had a weight loss of 7 pounds over a period of 2 weeks since her last visit. She has lost 27 lbs since starting treatment with [place].  Vitamin D deficiency Stacey Clark has a diagnosis of vitamin D deficiency. She is currently taking vit D and is not at goal. Her last vitamin D level was 35.2 on 12/08/18. She denies nausea, vomiting, or muscle weakness.  At risk for osteopenia and osteoporosis Stacey Clark is at higher risk of osteopenia and osteoporosis due to vitamin D deficiency.   Diabetes II Stacey Clark has a diagnosis of diabetes type II. Stacey Clark states that her fasting BGs range between 80 and 85 and her 2 hour post prandial sugars range from 72 to 121. Last A1c was 6.2 on 12/08/18. She is on Trulicity and admits to hypoglycemic episodes a few times a week. She has been working on intensive lifestyle modifications including diet, exercise, and weight loss to help control her blood glucose levels.  ASSESSMENT AND PLAN:  Vitamin D deficiency - Plan: Vitamin D, Ergocalciferol, (DRISDOL) 1.25 MG (50000 UT) CAPS capsule  Type 2 diabetes mellitus without complication, without long-term current use of insulin ([facility]) - Plan: Dulaglutide (TRULICITY) 5.64 PP/2.9JJ SOPN  At risk for osteoporosis  Class 3 severe obesity with serious comorbidity and body mass index (BMI) of 45.0 to 49.9 in adult, unspecified obesity type ([facility]) - Plan: Naltrexone-buPROPion HCl ER 8-90 MG TB12  PLAN:  Vitamin D  Deficiency Stacey Clark was informed that low vitamin D levels contributes to fatigue and are associated with obesity, breast, and colon cancer. She agrees to continue to take prescription Vit D @50 ,000 IU every week #4 with no refills and will follow up for routine testing of vitamin D, at least 2-3 times per year. She was informed of the risk of over-replacement of vitamin D and agrees to not increase her dose unless she discusses this with [place] first. Stacey Clark agrees to follow up in 3 weeks.  At risk for osteopenia and osteoporosis Stacey Clark was given extended (15 minutes) osteoporosis prevention counseling today. Stacey Clark is at risk for osteopenia and osteoporosis due to her vitamin D deficiency. She was encouraged to take her vitamin D and follow her higher calcium diet and increase strengthening exercise to help strengthen her bones and decrease her risk of osteopenia and osteoporosis.  Diabetes II Stacey Clark has been given extensive diabetes education by myself today including ideal fasting and post-prandial blood glucose readings, individual ideal Hgb A1c goals, and hypoglycemia prevention. We discussed the importance of good blood sugar control to decrease the likelihood of diabetic complications such as nephropathy, neuropathy, limb loss, blindness, coronary artery disease, and death. We discussed the importance of intensive lifestyle modification including diet, exercise and weight loss as the first line treatment for diabetes. Stacey Clark agrees to continue her Trulicity 0.75mg  weekly #4 pens with no refills and she will follow up at the agreed upon time in 3 weeks.  Obesity Stacey Clark is currently in the action stage of change. As such, her goal is to continue with weight loss efforts. She has agreed to follow the Category 3 plan. Stacey Clark has been instructed to walk for 3 to 4 days a week for 30 to 35 minutes.  We discussed the following Behavioral Modification Strategies today: no skipping meals, work on meal planning and  easy cooking plans, planning for success, and travel eating strategies.  We discussed various medications options to help Stacey Clark with her weight loss efforts and we both agreed to continue Contrave, 2 pills BID #120 with no refills and she will follow up in 3 weeks.  Stacey Clark has agreed to follow up with our clinic in 3 weeks. She was informed of the importance of frequent follow up visits to maximize her success with intensive lifestyle modifications for her multiple health conditions.  ALLERGIES: Allergies  Allergen Reactions  . Cephalexin Swelling  . Hydrocodone Nausea And Vomiting  . Rifampin Nausea And Vomiting, Rash and Other (See Comments)    Nausea, Vomiting  . Sulfa Antibiotics Hives  . Augmentin [Amoxicillin-Pot Clavulanate] Nausea And Vomiting  . Metformin And Related Other (See Comments)    GI upset  . Penicillins Swelling    MEDICATIONS: Current Outpatient Medications on File Prior to Visit  Medication Sig Dispense Refill  . ALPRAZolam (XANAX) 0.5 MG tablet Take 0.5 mg by mouth at bedtime as needed.    Marland Kitchen atorvastatin (LIPITOR) 10 MG tablet Take 1 tablet (10 mg total) by mouth daily. 30 tablet 0  . escitalopram (LEXAPRO) 5 MG tablet Take 5 mg by mouth daily.    . pantoprazole (PROTONIX) 20 MG tablet Take 1 tablet (20 mg total) by mouth daily. 30 tablet 0  . buPROPion (WELLBUTRIN SR) 150 MG 12 hr tablet Take 150 mg by mouth 2 (two) times daily.    . cetirizine (ZYRTEC) 10 MG tablet Take 1 tablet (10 mg total) by mouth daily. (Patient not taking: Reported on 02/01/2019) 30 tablet 0   No current facility-administered medications on file prior to visit.     PAST MEDICAL HISTORY: Past Medical History:  Diagnosis Date  . Anxiety   . Asthma   . Back pain   . Depression   . Diabetes mellitus without complication ([place])   . GERD (gastroesophageal reflux disease)   . HLD (hyperlipidemia)   . IBS (irritable bowel syndrome)   . Joint pain   . Obesity   . Sleep apnea     PAST  SURGICAL HISTORY: Past Surgical History:  Procedure Laterality Date  . CESAREAN SECTION    . TONSILLECTOMY      SOCIAL HISTORY: Social History   Tobacco Use  . Smoking status: Former Smoker    Last attempt to quit: 12/30/2006    Years since quitting: 12.0  . Smokeless tobacco: Never Used  Substance Use Topics  . Alcohol use: No    Alcohol/week: 0.0 standard drinks  . Drug use: No    FAMILY HISTORY: Family History  Problem Relation Age of Onset  . Hypertension Mother   . Cancer Mother   . Depression Mother   . Anxiety disorder Mother   . Sleep apnea Mother   . Obesity Mother   . Hyperlipidemia Father   . Cancer Father   . Depression Father   . Anxiety disorder Father   . Bipolar disorder Father   . Alcoholism Father   . Obesity Father   . Thyroid disease Neg Hx  ROS: Review of Systems  Constitutional: Positive for weight loss.  Gastrointestinal: Negative for nausea and vomiting.  Musculoskeletal:       Negative for muscle weakness.  Endo/Heme/Allergies:       Positive for hypoglycemia.   PHYSICAL EXAM: Blood pressure 125/81, pulse 60, temperature 97.9 F (36.6 C), temperature source Oral, height 5\' 1"  (1.549 m), weight 250 lb (113.4 kg), SpO2 98 %. Body mass index is 47.24 kg/m. Physical Exam Vitals signs reviewed.  Constitutional:      Appearance: Normal appearance. She is obese.  Cardiovascular:     Rate and Rhythm: Normal rate.  Pulmonary:     Effort: Pulmonary effort is normal.  Musculoskeletal: Normal range of motion.  Skin:    General: Skin is warm and dry.  Neurological:     Mental Status: She is alert and oriented to person, place, and time.  Psychiatric:        Mood and Affect: Mood normal.        Behavior: Behavior normal.     RECENT LABS AND TESTS: BMET    Component Value Date/Time   NA 133 (L) 12/08/2018 1031   K 4.4 12/08/2018 1031   CL 95 (L) 12/08/2018 1031   CO2 23 12/08/2018 1031   GLUCOSE 100 (H) 12/08/2018 1031   BUN  16 12/08/2018 1031   CREATININE 0.65 12/08/2018 1031   CALCIUM 9.5 12/08/2018 1031   GFRNONAA 105 12/08/2018 1031   GFRAA 121 12/08/2018 1031   Lab Results  Component Value Date   HGBA1C 6.2 (H) 12/08/2018   HGBA1C 6.3 (H) 09/02/2018   HGBA1C 6.8 (H) 05/14/2018   Lab Results  Component Value Date   INSULIN 25.8 (H) 12/08/2018   INSULIN 20.0 09/02/2018   INSULIN 41.6 (H) 05/14/2018   CBC    Component Value Date/Time   WBC 10.0 05/14/2018 0919   WBC 13.3 (H) 01/15/2012 1237   RBC 4.69 05/14/2018 0919   RBC 4.90 01/15/2012 1237   HGB 13.2 05/14/2018 0919   HCT 39.8 05/14/2018 0919   PLT 326 01/15/2012 1237   MCV 85 05/14/2018 0919   MCH 28.1 05/14/2018 0919   MCH 29.6 01/15/2012 1237   MCHC 33.2 05/14/2018 0919   MCHC 34.0 01/15/2012 1237   RDW 14.4 05/14/2018 0919   LYMPHSABS 2.3 05/14/2018 0919   MONOABS 0.7 01/15/2012 1237   EOSABS 0.2 05/14/2018 0919   BASOSABS 0.1 05/14/2018 0919   Iron/TIBC/Ferritin/ %Sat No results found for: IRON, TIBC, FERRITIN, IRONPCTSAT Lipid Panel     Component Value Date/Time   CHOL 234 (H) 12/08/2018 1031   TRIG 149 12/08/2018 1031   HDL 51 12/08/2018 1031   LDLCALC 153 (H) 12/08/2018 1031   Hepatic Function Panel     Component Value Date/Time   PROT 6.9 12/08/2018 1031   ALBUMIN 4.2 12/08/2018 1031   AST 12 12/08/2018 1031   ALT 14 12/08/2018 1031   ALKPHOS 87 12/08/2018 1031   BILITOT 0.2 12/08/2018 1031      Component Value Date/Time   TSH 2.360 05/14/2018 0919   Results for RAYLEY, GAO (MRN [id number]) as of 02/01/2019 13:28  Ref. Range 12/08/2018 10:31  Vitamin D, 25-Hydroxy Latest Ref Range: 30.0 - 100.0 ng/mL 35.2   OBESITY BEHAVIORAL INTERVENTION VISIT  Today's visit was # 15   Starting weight: 277 lbs Starting date: 05/14/18 Today's weight : Weight: 250 lb (113.4 kg)  Today's date: 02/01/2019 Total lbs lost to date: 30  ASK: We discussed the  diagnosis of obesity with Stacey Clark today and Stacey Clark agreed  to give [place] permission to discuss obesity behavioral modification therapy today.  ASSESS: Stacey Clark has the diagnosis of obesity and her BMI today is 47.2. Stacey Clark is in the action stage of change.   ADVISE: Stacey Clark was educated on the multiple health risks of obesity as well as the benefit of weight loss to improve her health. She was advised of the need for long term treatment and the importance of lifestyle modifications to improve her current health and to decrease her risk of future health problems.  AGREE: Multiple dietary modification options and treatment options were discussed and Stacey Clark agreed to follow the recommendations documented in the above note.  ARRANGE: Stacey Clark was educated on the importance of frequent visits to treat obesity as outlined per CMS and USPSTF guidelines and agreed to schedule her next follow up appointment today.  Lenward Chancellor, CMA, am acting as Location manager for [employer], FNP-C.  I have reviewed the above documentation for accuracy and completeness, and I agree with the above.  - Dawn Whitmire, FNP-C.

## 2019-02-04 ENCOUNTER — Encounter (INDEPENDENT_AMBULATORY_CARE_PROVIDER_SITE_OTHER): Payer: Self-pay

## 2019-02-04 ENCOUNTER — Encounter (INDEPENDENT_AMBULATORY_CARE_PROVIDER_SITE_OTHER): Payer: Self-pay | Admitting: Family Medicine

## 2019-02-04 NOTE — Telephone Encounter (Signed)
Spoke with pt on phone. She will be without the Contrave for about 1 day because insurance will not fill it until 2/10. Advised her to take bupropion SR 150 mg twice daily on the days she does not have the Contrave. She has leftover bupropion at home from when she was taking it previously.

## 2019-02-08 MED FILL — CONTRAVE ER 8-90 MG TABLET: 8-90 | 30 days supply | Qty: 120 | Fill #0

## 2019-02-16 MED FILL — PANTOPRAZOLE SOD DR 20 MG T: 20 | 90 days supply | Qty: 90 | Fill #0

## 2019-02-23 ENCOUNTER — Encounter (INDEPENDENT_AMBULATORY_CARE_PROVIDER_SITE_OTHER): Payer: Self-pay | Admitting: Family Medicine

## 2019-02-23 ENCOUNTER — Ambulatory Visit (INDEPENDENT_AMBULATORY_CARE_PROVIDER_SITE_OTHER): Payer: 59 | Admitting: Family Medicine

## 2019-02-23 VITALS — BP 115/72 | HR 64 | Temp 98.0°F | Ht 61.0 in | Wt 249.0 lb

## 2019-02-23 DIAGNOSIS — E119 Type 2 diabetes mellitus without complications: Secondary | ICD-10-CM

## 2019-02-23 DIAGNOSIS — Z6841 Body Mass Index (BMI) 40.0 and over, adult: Secondary | ICD-10-CM | POA: Diagnosis not present

## 2019-02-23 DIAGNOSIS — E7849 Other hyperlipidemia: Secondary | ICD-10-CM

## 2019-02-23 DIAGNOSIS — E66813 Obesity, class 3: Secondary | ICD-10-CM

## 2019-02-23 DIAGNOSIS — Z9189 Other specified personal risk factors, not elsewhere classified: Secondary | ICD-10-CM | POA: Diagnosis not present

## 2019-02-23 DIAGNOSIS — E559 Vitamin D deficiency, unspecified: Secondary | ICD-10-CM

## 2019-02-23 MED ORDER — ATORVASTATIN CALCIUM 10 MG PO TABS: 10.0000 mg | ORAL_TABLET | Freq: Every day | ORAL | 0 refills | Status: DC

## 2019-02-23 MED ORDER — DULAGLUTIDE 0.75 MG/0.5ML ~~LOC~~ SOAJ: 0.7500 mg | SUBCUTANEOUS | 0 refills | Status: DC

## 2019-02-23 MED ORDER — NALTREXONE-BUPROPION HCL ER 8-90 MG PO TB12: 120.0000 mg | ORAL_TABLET | ORAL | 0 refills | Status: DC

## 2019-02-23 MED ORDER — VITAMIN D (ERGOCALCIFEROL) 1.25 MG (50000 UNIT) PO CAPS: 50000.0000 [IU] | ORAL_CAPSULE | ORAL | 0 refills | Status: DC

## 2019-02-23 MED FILL — ATORVASTATIN 10 MG TABLET: 10 | 90 days supply | Qty: 90 | Fill #0

## 2019-02-23 MED FILL — TRULICITY 0.75 MG/0.5 ML PE: 0.75 | 28 days supply | Qty: 2 | Fill #0

## 2019-02-23 NOTE — Progress Notes (Signed)
Office: [phone]  /  Fax: [phone]   HPI:   Chief Complaint: OBESITY Stacey Clark is here to discuss her progress with her obesity treatment plan. She is on the Category 3 plan and is following her eating plan approximately 90 % of the time. She states she is walking 12000 steps 7 times per week. Stacey Clark is on Contrave 2 pills bid. She reports that it helps with polyphagia.  She has had increased stress recently which has made staying on plan more difficult.  Her weight is 249 lb (112.9 kg) today and has had a weight loss of 1 pounds over a period of 3 weeks since her last visit. She has lost 28 lbs since starting treatment with [place].  Hyperlipidemia Stacey Clark has hyperlipidemia and has been trying to improve her cholesterol levels with intensive lifestyle modification including a low saturated fat diet, exercise and weight loss. She denies any chest pain or myalgias. She is on Atorvastatin, which was started recently. Her last LDL after starting Atorvastatin was 153. The 10-year ASCVD risk score Stacey Clark DC Stacey Bonito., et al., 2013) is: 2.4%   Values used to calculate the score:     Age: 48 years     Sex: Female     Is Non-Hispanic African American: No     Diabetic: Yes     Tobacco smoker: No     Systolic Blood Pressure: 267 mmHg     Is BP treated: No     HDL Cholesterol: 51 mg/dL     Total Cholesterol: 234 mg/dL   Diabetes II Stacey Clark has a diagnosis of diabetes type II. Tanda states BGs range in the 80's and 2 hour postprandial 90's - low 100's. She denies any hypoglycemic episodes. She is well controlled -her last A1c was Hemoglobin A1C Latest Ref Rng & Units 12/08/2018 09/02/2018 05/14/2018  HGBA1C 4.8 - 5.6 % 6.2(H) 6.3(H) 6.8(H)  Some recent data might be hidden    She has been working on intensive lifestyle modifications including diet, exercise, and weight loss to help control her blood glucose levels.  Vitamin D deficiency Stacey Clark has a diagnosis of vitamin D deficiency. She is currently  taking prescription Vit D. She is not at goal. Her last Vit D was 35.2 on 12/08/18. She denies nausea, vomiting or muscle weakness.  At risk for cardiovascular disease Stacey Clark is at a higher than average risk for cardiovascular disease due to obesity. She currently denies any chest pain.  ASSESSMENT AND PLAN:  Other hyperlipidemia - Plan: atorvastatin (LIPITOR) 10 MG tablet  Type 2 diabetes mellitus without complication, without long-term current use of insulin (HCC) - Plan: Dulaglutide (TRULICITY) 1.24 PY/0.9XI SOPN  Vitamin D deficiency - Plan: Vitamin D, Ergocalciferol, (DRISDOL) 1.25 MG (50000 UT) CAPS capsule  At risk for heart disease  Class 3 severe obesity with serious comorbidity and body mass index (BMI) of 45.0 to 49.9 in adult, unspecified obesity type (HCC) - Plan: Naltrexone-buPROPion HCl ER 8-90 MG TB12  PLAN:  Hyperlipidemia Stacey Clark was informed of the American Heart Association Guidelines emphasizing intensive lifestyle modifications as the first line treatment for hyperlipidemia. We discussed many lifestyle modifications today in depth, and Stacey Clark will continue to work on decreasing saturated fats such as fatty red meat, butter and many fried foods. She will also increase vegetables and lean protein in her diet and continue to work on exercise and weight loss efforts. We will refill Atorvastatin 10 mg qd #90 with no refills. We will check labs at next visit.  Stacey Clark agrees to follow up with our clinic in 4 weeks.  Diabetes II Stacey Clark has been given extensive diabetes education by myself today including ideal fasting and post-prandial blood glucose readings, individual ideal Hgb A1c goals  and hypoglycemia prevention. We discussed the importance of good blood sugar control to decrease the likelihood of diabetic complications such as nephropathy, neuropathy, limb loss, blindness, coronary artery disease, and death. We discussed the importance of intensive lifestyle modification  including diet, exercise and weight loss as the first line treatment for diabetes. Stacey Clark agrees to continue taking dulaglutide 0.75 mg weekly #4 pens with no refills. We will check labs at next visit. Stacey Clark agrees to follow up with our clinic in 4 weeks.  Vitamin D Deficiency Stacey Clark was informed that low vitamin D levels contributes to fatigue and are associated with obesity, breast, and colon cancer. Stacey Clark agrees to continue to take prescription Vit D 50,000 IU every 3 days #10 with no refills and will follow up for routine testing of vitamin D, at least 2-3 times per year. She was informed of the risk of over-replacement of vitamin D and agrees to not increase her dose unless she discusses this with [place] first.  We will check labs at next visit. Stacey Clark agrees to follow up with our clinic in 4 weeks.  Cardiovascular risk counseling Stacey Clark was given extended (15 minutes) coronary artery disease prevention counseling today. She is 48 y.o. female and has risk factors for heart disease including obesity. We discussed intensive lifestyle modifications today with an emphasis on specific weight loss instructions and strategies. Pt was also informed of the importance of increasing exercise and decreasing saturated fats to help prevent heart disease.  Obesity Stacey Clark is currently in the action stage of change. As such, her goal is to continue with weight loss efforts She has agreed to follow the Category 3 plan Stacey Clark has been instructed to continue with walking for 12000 steps 7 times per week We discussed the following Behavioral Modification Strategies today: emotional eating strategies and planning for success Annalis has been instructed to instructed to continue with Contrave 2 pills bid. We will refill Contrave 8/90 2 pills bid #120 with no refills.  Stacey Clark has agrees with this plan and will follow up with our clinic in 4 weeks. She was informed of the importance of frequent follow up visits to maximize her  success with intensive lifestyle modifications for her multiple health conditions.  ALLERGIES: Allergies  Allergen Reactions  . Cephalexin Swelling  . Hydrocodone Nausea And Vomiting  . Rifampin Nausea And Vomiting, Rash and Other (See Comments)    Nausea, Vomiting  . Sulfa Antibiotics Hives  . Augmentin [Amoxicillin-Pot Clavulanate] Nausea And Vomiting  . Metformin And Related Other (See Comments)    GI upset  . Penicillins Swelling    MEDICATIONS: Current Outpatient Medications on File Prior to Visit  Medication Sig Dispense Refill  . ALPRAZolam (XANAX) 0.5 MG tablet Take 0.5 mg by mouth every morning.     . escitalopram (LEXAPRO) 5 MG tablet Take 5 mg by mouth daily.    . pantoprazole (PROTONIX) 20 MG tablet Take 1 tablet (20 mg total) by mouth daily. 30 tablet 0  . cetirizine (ZYRTEC) 10 MG tablet Take 1 tablet (10 mg total) by mouth daily. (Patient not taking: Reported on 02/01/2019) 30 tablet 0   No current facility-administered medications on file prior to visit.     PAST MEDICAL HISTORY: Past Medical History:  Diagnosis Date  . Anxiety   .  Asthma   . Back pain   . Depression   . Diabetes mellitus without complication ([place])   . GERD (gastroesophageal reflux disease)   . HLD (hyperlipidemia)   . IBS (irritable bowel syndrome)   . Joint pain   . Obesity   . Sleep apnea     PAST SURGICAL HISTORY: Past Surgical History:  Procedure Laterality Date  . CESAREAN SECTION    . TONSILLECTOMY      SOCIAL HISTORY: Social History   Tobacco Use  . Smoking status: Former Smoker    Last attempt to quit: 12/30/2006    Years since quitting: 12.1  . Smokeless tobacco: Never Used  Substance Use Topics  . Alcohol use: No    Alcohol/week: 0.0 standard drinks  . Drug use: No    FAMILY HISTORY: Family History  Problem Relation Age of Onset  . Hypertension Mother   . Cancer Mother   . Depression Mother   . Anxiety disorder Mother   . Sleep apnea Mother   . Obesity  Mother   . Hyperlipidemia Father   . Cancer Father   . Depression Father   . Anxiety disorder Father   . Bipolar disorder Father   . Alcoholism Father   . Obesity Father   . Thyroid disease Neg Hx     ROS: Review of Systems  Constitutional: Positive for weight loss.  Respiratory: Negative for shortness of breath.   Cardiovascular: Negative for chest pain.  Gastrointestinal: Negative for diarrhea, nausea and vomiting.  Musculoskeletal: Negative for myalgias.       Negative for muscle weakness  Endo/Heme/Allergies:       Negative for hypoglycemia     PHYSICAL EXAM: Blood pressure 115/72, pulse 64, temperature 98 F (36.7 C), temperature source Oral, height 5\' 1"  (1.549 m), weight 249 lb (112.9 kg), SpO2 95 %. Body mass index is 47.05 kg/m. Physical Exam Vitals signs reviewed.  Constitutional:      Appearance: Normal appearance. She is obese.  Cardiovascular:     Rate and Rhythm: Normal rate.     Pulses: Normal pulses.  Pulmonary:     Effort: Pulmonary effort is normal.  Musculoskeletal: Normal range of motion.  Skin:    General: Skin is warm and dry.  Neurological:     Mental Status: She is alert and oriented to person, place, and time.  Psychiatric:        Mood and Affect: Mood normal.        Behavior: Behavior normal.     RECENT LABS AND TESTS: BMET    Component Value Date/Time   NA 133 (L) 12/08/2018 1031   K 4.4 12/08/2018 1031   CL 95 (L) 12/08/2018 1031   CO2 23 12/08/2018 1031   GLUCOSE 100 (H) 12/08/2018 1031   BUN 16 12/08/2018 1031   CREATININE 0.65 12/08/2018 1031   CALCIUM 9.5 12/08/2018 1031   GFRNONAA 105 12/08/2018 1031   GFRAA 121 12/08/2018 1031   Lab Results  Component Value Date   HGBA1C 6.2 (H) 12/08/2018   HGBA1C 6.3 (H) 09/02/2018   HGBA1C 6.8 (H) 05/14/2018   Lab Results  Component Value Date   INSULIN 25.8 (H) 12/08/2018   INSULIN 20.0 09/02/2018   INSULIN 41.6 (H) 05/14/2018   CBC    Component Value Date/Time   WBC  10.0 05/14/2018 0919   WBC 13.3 (H) 01/15/2012 1237   RBC 4.69 05/14/2018 0919   RBC 4.90 01/15/2012 1237   HGB 13.2 05/14/2018 0919  HCT 39.8 05/14/2018 0919   PLT 326 01/15/2012 1237   MCV 85 05/14/2018 0919   MCH 28.1 05/14/2018 0919   MCH 29.6 01/15/2012 1237   MCHC 33.2 05/14/2018 0919   MCHC 34.0 01/15/2012 1237   RDW 14.4 05/14/2018 0919   LYMPHSABS 2.3 05/14/2018 0919   MONOABS 0.7 01/15/2012 1237   EOSABS 0.2 05/14/2018 0919   BASOSABS 0.1 05/14/2018 0919   Iron/TIBC/Ferritin/ %Sat No results found for: IRON, TIBC, FERRITIN, IRONPCTSAT Lipid Panel     Component Value Date/Time   CHOL 234 (H) 12/08/2018 1031   TRIG 149 12/08/2018 1031   HDL 51 12/08/2018 1031   LDLCALC 153 (H) 12/08/2018 1031   Hepatic Function Panel     Component Value Date/Time   PROT 6.9 12/08/2018 1031   ALBUMIN 4.2 12/08/2018 1031   AST 12 12/08/2018 1031   ALT 14 12/08/2018 1031   ALKPHOS 87 12/08/2018 1031   BILITOT 0.2 12/08/2018 1031      Component Value Date/Time   TSH 2.360 05/14/2018 0919     Ref. Range 12/08/2018 10:31  Vitamin D, 25-Hydroxy Latest Ref Range: 30.0 - 100.0 ng/mL 35.2     OBESITY BEHAVIORAL INTERVENTION VISIT  Today's visit was # 16   Starting weight: 277 lbs Starting date: 05/14/2018 Today's weight :: 249 lbs Today's date: 02/23/2019 Total lbs lost to date: 28    02/23/2019  BP 115/72  Temp 98 F (36.7 C)  Pulse 64  SpO2 95 %  Weight 249 lb  Height 5\' 1"  (1.549 m)  BMI (Calculated) 47.07   ASK: We discussed the diagnosis of obesity with Stacey Clark today and Stacey Clark agreed to give [place] permission to discuss obesity behavioral modification therapy today.  ASSESS: Tyrone has the diagnosis of obesity and her BMI today is 47.07 [employer] is in the action stage of change   ADVISE: Stacey Clark was educated on the multiple health risks of obesity as well as the benefit of weight loss to improve her health. She was advised of the need for long term treatment  and the importance of lifestyle modifications to improve her current health and to decrease her risk of future health problems.  AGREE: Multiple dietary modification options and treatment options were discussed and  Stacey Clark agreed to follow the recommendations documented in the above note.  ARRANGE: Stacey Clark was educated on the importance of frequent visits to treat obesity as outlined per CMS and USPSTF guidelines and agreed to schedule her next follow up appointment today.  I, Tammy Wysor, am acting as Location manager for [employer], FNP-C.  I have reviewed the above documentation for accuracy and completeness, and I agree with the above.  - Meiah Zamudio, FNP-C.

## 2019-02-24 DIAGNOSIS — R05 Cough: Secondary | ICD-10-CM | POA: Diagnosis not present

## 2019-02-25 MED FILL — VIT D2 1.25 MG (50,000 UNIT: 1.25 MG | 30 days supply | Qty: 10 | Fill #0

## 2019-03-04 MED FILL — CONTRAVE ER 8-90 MG TABLET: 8-90 | 30 days supply | Qty: 120 | Fill #0

## 2019-03-19 MED FILL — ESCITALOPRAM 20 MG TABLET: 20 | 90 days supply | Qty: 90 | Fill #2

## 2019-03-24 ENCOUNTER — Other Ambulatory Visit: Payer: Self-pay

## 2019-03-24 ENCOUNTER — Ambulatory Visit (INDEPENDENT_AMBULATORY_CARE_PROVIDER_SITE_OTHER): Payer: 59 | Admitting: Family Medicine

## 2019-03-24 ENCOUNTER — Encounter (INDEPENDENT_AMBULATORY_CARE_PROVIDER_SITE_OTHER): Payer: Self-pay | Admitting: Family Medicine

## 2019-03-24 DIAGNOSIS — Z6841 Body Mass Index (BMI) 40.0 and over, adult: Secondary | ICD-10-CM | POA: Diagnosis not present

## 2019-03-24 DIAGNOSIS — E119 Type 2 diabetes mellitus without complications: Secondary | ICD-10-CM | POA: Diagnosis not present

## 2019-03-24 DIAGNOSIS — E559 Vitamin D deficiency, unspecified: Secondary | ICD-10-CM

## 2019-03-24 DIAGNOSIS — E66813 Obesity, class 3: Secondary | ICD-10-CM

## 2019-03-24 DIAGNOSIS — E7849 Other hyperlipidemia: Secondary | ICD-10-CM | POA: Diagnosis not present

## 2019-03-24 MED ORDER — DULAGLUTIDE 0.75 MG/0.5ML ~~LOC~~ SOAJ: 0.7500 mg | SUBCUTANEOUS | 0 refills | Status: DC

## 2019-03-24 MED ORDER — VITAMIN D (ERGOCALCIFEROL) 1.25 MG (50000 UNIT) PO CAPS: 50000.0000 [IU] | ORAL_CAPSULE | ORAL | 0 refills | Status: DC

## 2019-03-24 MED ORDER — NALTREXONE-BUPROPION HCL ER 8-90 MG PO TB12: 120.0000 mg | ORAL_TABLET | ORAL | 0 refills | Status: DC

## 2019-03-24 MED FILL — VIT D2 1.25 MG (50,000 UNIT: 1.25 MG | 30 days supply | Qty: 10 | Fill #0

## 2019-03-24 MED FILL — CONTRAVE ER 8-90 MG TABLET: 8-90 | 30 days supply | Qty: 120 | Fill #0

## 2019-03-24 MED FILL — TRULICITY 0.75 MG/0.5 ML PE: 0.75 | 28 days supply | Qty: 2 | Fill #0

## 2019-03-25 ENCOUNTER — Encounter (INDEPENDENT_AMBULATORY_CARE_PROVIDER_SITE_OTHER): Payer: Self-pay

## 2019-03-25 NOTE — Progress Notes (Addendum)
Office: [phone]  /  Fax: [phone] TeleHealth Visit:  Stacey Clark has consented to this TeleHealth visit today via telephone call. The patient is located at home, the provider is located at the [facility] office. The participants in this visit include the listed provider and patient and provider's assistant.   HPI:   Chief Complaint: OBESITY Stacey Clark is here to discuss her progress with her obesity treatment plan. She is on the Category 3 plan and is following her eating plan approximately 50 % of the time. She states she is walking 10,000 steps 7 times per week. Stacey Clark's weight at home was 247 lbs today. She struggles to stick to the plan. She has been skipping meals. She is taking Contrave 2 pills BID. We were unable to weight the patient today for this TeleHealth visit. She feels as if she has lost 2 lbs since her last visit. She has lost 28-30 lbs since starting treatment with [place].  Diabetes II Stacey Clark has a diagnosis of diabetes type II. Her diabetes is well controlled on Trulicity. Stacey Clark states fasting BGs range in 80's and 2 hour post prandial range between 99 and 115. She denies hypoglycemia. Last A1c was 6.2 on 12/08/18. She has been working on intensive lifestyle modifications including diet, exercise, and weight loss to help control her blood glucose levels.  Vitamin D Deficiency Stacey Clark has a diagnosis of vitamin D deficiency. She is currently taking prescription Vit D, but level is not at goal. Last Vit D level 35.2 on 12/08/18. She denies nausea, vomiting or muscle weakness.  Hyperlipidemia Stacey Clark has hyperlipidemia and has been trying to improve her cholesterol levels with intensive lifestyle modification including a low saturated fat diet, exercise and weight loss. Last LDL was elevated at 153, and HDL and triglycerides within normal limits. She is on statin and denies any chest pain or shortness of breath. The 10-year ASCVD risk score Stacey Clark., et al.,  2013) is: 2.6%   Values used to calculate the score:     Age: 48 years     Sex: Female     Is Non-Hispanic African American: No     Diabetic: Yes     Tobacco smoker: No     Systolic Blood Pressure: 431 mmHg     Is BP treated: No     HDL Cholesterol: 51 mg/dL     Total Cholesterol: 234 mg/dL   ASSESSMENT AND PLAN:  Type 2 diabetes mellitus without complication, without long-term current use of insulin (HCC) - Plan: Comprehensive metabolic panel, Hemoglobin A1c, Insulin, random, Dulaglutide (TRULICITY) 5.40 GQ/6.7YP SOPN  Vitamin D deficiency - Plan: VITAMIN D 25 Hydroxy (Vit-D Deficiency, Fractures), Vitamin D, Ergocalciferol, (DRISDOL) 1.25 MG (50000 UT) CAPS capsule  Other hyperlipidemia - Plan: Lipid Panel With LDL/HDL Ratio  Class 3 severe obesity with serious comorbidity and body mass index (BMI) of 45.0 to 49.9 in adult, unspecified obesity type (HCC) - Plan: Naltrexone-buPROPion HCl ER 8-90 MG TB12  PLAN:  Diabetes II Stacey Clark has been given extensive diabetes education by myself today including ideal fasting and post-prandial blood glucose readings, individual ideal Hgb A1c goals and hypoglycemia prevention. We discussed the importance of good blood sugar control to decrease the likelihood of diabetic complications such as nephropathy, neuropathy, limb loss, blindness, coronary artery disease, and death. We discussed the importance of intensive lifestyle modification including diet, exercise and weight loss as the first line treatment for diabetes. Stacey Clark agrees to continue Trulicity 9.50 mg SubQ  weekly #4 pens and we will refill for 1 month. We will check A1c, fasting insulin and glucose today. Stacey Clark agrees to follow up with our clinic in 2 to 3 weeks.  Vitamin D Deficiency Stacey Clark was informed that low vitamin D levels contributes to fatigue and are associated with obesity, breast, and colon cancer. Stacey Clark agrees to continue taking prescription Vit D @50 ,000 IU every 3 days #10 and we  will refill for 1 month. She will follow up for routine testing of vitamin D, at least 2-3 times per year. She was informed of the risk of over-replacement of vitamin D and agrees to not increase her dose unless she discusses this with [place] first. We will check Vit D level today. Stacey Clark agrees to follow up with our clinic in 2 to 3 weeks.  Hyperlipidemia Stacey Clark was informed of the American Heart Association Guidelines emphasizing intensive lifestyle modifications as the first line treatment for hyperlipidemia. We discussed many lifestyle modifications today in depth, and Stacey Clark will continue to work on decreasing saturated fats such as fatty red meat, butter and many fried foods. She will also increase vegetables and lean protein in her diet and continue to work on exercise and weight loss efforts. We will check FLP today. Stacey Clark agrees to follow with our clinic in 2 to 3 weeks.  Obesity Stacey Clark is currently in the action stage of change. As such, her goal is to continue with weight loss efforts She has agreed to follow the Category 3 plan Stacey Clark has been instructed to walk for 30 minutes 5 days per week for weight loss and overall health benefits. We discussed the following Behavioral Modification Strategies today: increasing lean protein intake, no skipping meals, keeping healthy foods in the home, and planning for success We discussed various medication options to help Stacey Clark with her weight loss efforts and we both agreed to continue Contrave 8-90 mg 2 pills BID #120 and we will refill for 1 month.  Stacey Clark has agreed to follow up with our clinic in 2 to 3 weeks. She was informed of the importance of frequent follow up visits to maximize her success with intensive lifestyle modifications for her multiple health conditions.  ALLERGIES: Allergies  Allergen Reactions  . Cephalexin Swelling  . Hydrocodone Nausea And Vomiting  . Rifampin Nausea And Vomiting, Rash and Other (See Comments)    Nausea, Vomiting   . Sulfa Antibiotics Hives  . Augmentin [Amoxicillin-Pot Clavulanate] Nausea And Vomiting  . Metformin And Related Other (See Comments)    GI upset  . Penicillins Swelling    MEDICATIONS: Current Outpatient Medications on File Prior to Visit  Medication Sig Dispense Refill  . ALPRAZolam (XANAX) 0.5 MG tablet Take 0.5 mg by mouth every morning.     Marland Kitchen atorvastatin (LIPITOR) 10 MG tablet Take 1 tablet (10 mg total) by mouth daily. 90 tablet 0  . cetirizine (ZYRTEC) 10 MG tablet Take 1 tablet (10 mg total) by mouth daily. (Patient not taking: Reported on 02/01/2019) 30 tablet 0  . escitalopram (LEXAPRO) 5 MG tablet Take 5 mg by mouth daily.    . pantoprazole (PROTONIX) 20 MG tablet Take 1 tablet (20 mg total) by mouth daily. 30 tablet 0   No current facility-administered medications on file prior to visit.     PAST MEDICAL HISTORY: Past Medical History:  Diagnosis Date  . Anxiety   . Asthma   . Back pain   . Depression   . Diabetes mellitus without complication ([place])   .  GERD (gastroesophageal reflux disease)   . HLD (hyperlipidemia)   . IBS (irritable bowel syndrome)   . Joint pain   . Obesity   . Sleep apnea     PAST SURGICAL HISTORY: Past Surgical History:  Procedure Laterality Date  . CESAREAN SECTION    . TONSILLECTOMY      SOCIAL HISTORY: Social History   Tobacco Use  . Smoking status: Former Smoker    Last attempt to quit: 12/30/2006    Years since quitting: 12.2  . Smokeless tobacco: Never Used  Substance Use Topics  . Alcohol use: No    Alcohol/week: 0.0 standard drinks  . Drug use: No    FAMILY HISTORY: Family History  Problem Relation Age of Onset  . Hypertension Mother   . Cancer Mother   . Depression Mother   . Anxiety disorder Mother   . Sleep apnea Mother   . Obesity Mother   . Hyperlipidemia Father   . Cancer Father   . Depression Father   . Anxiety disorder Father   . Bipolar disorder Father   . Alcoholism Father   . Obesity Father   .  Thyroid disease Neg Hx     ROS: Review of Systems  Constitutional: Positive for weight loss.  Respiratory: Negative for shortness of breath.   Cardiovascular: Negative for chest pain.  Gastrointestinal: Negative for nausea and vomiting.  Musculoskeletal:       Negative muscle weakness  Endo/Heme/Allergies:       Negative hypoglycemia    PHYSICAL EXAM: Pt in no acute distress  RECENT LABS AND TESTS: BMET    Component Value Date/Time   NA 133 (L) 12/08/2018 1031   K 4.4 12/08/2018 1031   CL 95 (L) 12/08/2018 1031   CO2 23 12/08/2018 1031   GLUCOSE 100 (H) 12/08/2018 1031   BUN 16 12/08/2018 1031   CREATININE 0.65 12/08/2018 1031   CALCIUM 9.5 12/08/2018 1031   GFRNONAA 105 12/08/2018 1031   GFRAA 121 12/08/2018 1031   Lab Results  Component Value Date   HGBA1C 6.2 (H) 12/08/2018   HGBA1C 6.3 (H) 09/02/2018   HGBA1C 6.8 (H) 05/14/2018   Lab Results  Component Value Date   INSULIN 25.8 (H) 12/08/2018   INSULIN 20.0 09/02/2018   INSULIN 41.6 (H) 05/14/2018   CBC    Component Value Date/Time   WBC 10.0 05/14/2018 0919   WBC 13.3 (H) 01/15/2012 1237   RBC 4.69 05/14/2018 0919   RBC 4.90 01/15/2012 1237   HGB 13.2 05/14/2018 0919   HCT 39.8 05/14/2018 0919   PLT 326 01/15/2012 1237   MCV 85 05/14/2018 0919   MCH 28.1 05/14/2018 0919   MCH 29.6 01/15/2012 1237   MCHC 33.2 05/14/2018 0919   MCHC 34.0 01/15/2012 1237   RDW 14.4 05/14/2018 0919   LYMPHSABS 2.3 05/14/2018 0919   MONOABS 0.7 01/15/2012 1237   EOSABS 0.2 05/14/2018 0919   BASOSABS 0.1 05/14/2018 0919   Iron/TIBC/Ferritin/ %Sat No results found for: IRON, TIBC, FERRITIN, IRONPCTSAT Lipid Panel     Component Value Date/Time   CHOL 234 (H) 12/08/2018 1031   TRIG 149 12/08/2018 1031   HDL 51 12/08/2018 1031   LDLCALC 153 (H) 12/08/2018 1031   Hepatic Function Panel     Component Value Date/Time   PROT 6.9 12/08/2018 1031   ALBUMIN 4.2 12/08/2018 1031   AST 12 12/08/2018 1031   ALT 14  12/08/2018 1031   ALKPHOS 87 12/08/2018 1031   BILITOT  0.2 12/08/2018 1031      Component Value Date/Time   TSH 2.360 05/14/2018 0919      I, Trixie Dredge, am acting as Location manager for [employer], FNP-C. I have reviewed the above documentation for accuracy and completeness, and I agree with the above.  -  , FNP-C.

## 2019-03-29 ENCOUNTER — Encounter (INDEPENDENT_AMBULATORY_CARE_PROVIDER_SITE_OTHER): Payer: Self-pay | Admitting: Family Medicine

## 2019-04-08 DIAGNOSIS — E119 Type 2 diabetes mellitus without complications: Secondary | ICD-10-CM | POA: Diagnosis not present

## 2019-04-08 DIAGNOSIS — E7849 Other hyperlipidemia: Secondary | ICD-10-CM | POA: Diagnosis not present

## 2019-04-08 DIAGNOSIS — E559 Vitamin D deficiency, unspecified: Secondary | ICD-10-CM | POA: Diagnosis not present

## 2019-04-09 LAB — COMPREHENSIVE METABOLIC PANEL
ALT: 12 IU/L (ref 0–32)
AST: 12 IU/L (ref 0–40)
Albumin/Globulin Ratio: 1.6 (ref 1.2–2.2)
Albumin: 3.9 g/dL (ref 3.8–4.8)
Alkaline Phosphatase: 84 IU/L (ref 39–117)
BUN/Creatinine Ratio: 16 (ref 9–23)
BUN: 10 mg/dL (ref 6–24)
Bilirubin Total: 0.3 mg/dL (ref 0.0–1.2)
CO2: 26 mmol/L (ref 20–29)
Calcium: 8.9 mg/dL (ref 8.7–10.2)
Chloride: 100 mmol/L (ref 96–106)
Creatinine, Ser: 0.64 mg/dL (ref 0.57–1.00)
GFR calc Af Amer: 121 mL/min/{1.73_m2} (ref >59)
GFR calc non Af Amer: 105 mL/min/{1.73_m2} (ref >59)
Globulin, Total: 2.4 g/dL (ref 1.5–4.5)
Glucose: 97 mg/dL (ref 65–99)
Potassium: 4.4 mmol/L (ref 3.5–5.2)
Sodium: 139 mmol/L (ref 134–144)
Total Protein: 6.3 g/dL (ref 6.0–8.5)

## 2019-04-09 LAB — LIPID PANEL WITH LDL/HDL RATIO
Cholesterol, Total: 152 mg/dL (ref 100–199)
HDL: 52 mg/dL (ref >39)
LDL Calculated: 82 mg/dL (ref 0–99)
LDl/HDL Ratio: 1.6 ratio (ref 0.0–3.2)
Triglycerides: 92 mg/dL (ref 0–149)
VLDL Cholesterol Cal: 18 mg/dL (ref 5–40)

## 2019-04-09 LAB — HEMOGLOBIN A1C
Est. average glucose Bld gHb Est-mCnc: 137 mg/dL
Hgb A1c MFr Bld: 6.4 % — ABNORMAL HIGH (ref 4.8–5.6)

## 2019-04-09 LAB — INSULIN, RANDOM: INSULIN: 21.1 u[IU]/mL (ref 2.6–24.9)

## 2019-04-09 LAB — VITAMIN D 25 HYDROXY (VIT D DEFICIENCY, FRACTURES): Vit D, 25-Hydroxy: 51.5 ng/mL (ref 30.0–100.0)

## 2019-04-15 ENCOUNTER — Ambulatory Visit (INDEPENDENT_AMBULATORY_CARE_PROVIDER_SITE_OTHER): Payer: 59 | Admitting: Family Medicine

## 2019-04-22 ENCOUNTER — Other Ambulatory Visit: Payer: Self-pay

## 2019-04-22 ENCOUNTER — Ambulatory Visit (INDEPENDENT_AMBULATORY_CARE_PROVIDER_SITE_OTHER): Payer: 59 | Admitting: Family Medicine

## 2019-04-22 DIAGNOSIS — Z6841 Body Mass Index (BMI) 40.0 and over, adult: Secondary | ICD-10-CM | POA: Diagnosis not present

## 2019-04-22 DIAGNOSIS — E7849 Other hyperlipidemia: Secondary | ICD-10-CM | POA: Diagnosis not present

## 2019-04-22 DIAGNOSIS — E66813 Obesity, class 3: Secondary | ICD-10-CM

## 2019-04-22 DIAGNOSIS — E119 Type 2 diabetes mellitus without complications: Secondary | ICD-10-CM

## 2019-04-22 DIAGNOSIS — E559 Vitamin D deficiency, unspecified: Secondary | ICD-10-CM

## 2019-04-22 MED ORDER — DULAGLUTIDE 0.75 MG/0.5ML ~~LOC~~ SOAJ: 0.7500 mg | SUBCUTANEOUS | 0 refills | Status: DC

## 2019-04-22 MED ORDER — ATORVASTATIN CALCIUM 10 MG PO TABS: 10.0000 mg | ORAL_TABLET | Freq: Every day | ORAL | 0 refills | Status: DC

## 2019-04-22 MED FILL — TRULICITY 0.75 MG/0.5 ML PE: 0.75 | 28 days supply | Qty: 2 | Fill #0

## 2019-04-22 MED FILL — ATORVASTATIN 10 MG TABLET: 10 | 90 days supply | Qty: 90 | Fill #0

## 2019-04-26 ENCOUNTER — Encounter (INDEPENDENT_AMBULATORY_CARE_PROVIDER_SITE_OTHER): Payer: Self-pay | Admitting: Family Medicine

## 2019-04-26 NOTE — Progress Notes (Signed)
Office: [phone]  /  Fax: [phone] TeleHealth Visit:  EMSLEE Clark has verbally consented to this TeleHealth visit today. The patient is located at home, the provider is located at the [facility] office. The participants in this visit include the listed provider and patient and any and all parties involved. The visit was conducted today via FaceTime.  HPI:   Chief Complaint: OBESITY Stacey Clark is here to discuss her progress with her obesity treatment plan. She is on the Category 3 plan and is following her eating plan approximately 20 % of the time. She states she is walking the dog for 30 minutes 2 times per week. Stacey Clark has gained weight due to stress eating. 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  We were unable to weigh the patient today for this TeleHealth visit. She feels as if she has gained weight since her last visit. She has lost 25 to 27 lbs since starting treatment with [place].  Diabetes II Stacey Clark has a diagnosis of diabetes type II. She is well controlled on Trulicity 3.22 mg weekly. Stacey Clark states CBGs range between 85 and 100 fasting and 2 hour post prandial BGs range between 104 and 114 and she denies any hypoglycemic episodes. Last A1c was at 6.4 which is up from 6.2, but her Trulicity dose had been decreased a few months ago. She has been working on intensive lifestyle modifications including diet, exercise, and weight loss to help control her blood glucose levels.  Vitamin D deficiency Stacey Clark has a diagnosis of vitamin D deficiency. Her vitamin D level is at goal. She is currently taking vit D and denies nausea, vomiting or muscle weakness.  Hyperlipidemia Stacey Clark has hyperlipidemia and she has been trying to improve her cholesterol levels with intensive lifestyle modification including a low saturated fat diet, exercise and weight loss. Her LDL greatly decreased from 153 to 82 since starting Lipitor. She denies any chest pain or shortness of  breath.  ASSESSMENT AND PLAN:  Type 2 diabetes mellitus without complication, without long-term current use of insulin (HCC) - Plan: Dulaglutide (TRULICITY) 0.25 [place]/7.0WC SOPN  Other hyperlipidemia - Plan: atorvastatin (LIPITOR) 10 MG tablet  Vitamin D deficiency  Class 3 severe obesity with serious comorbidity and body mass index (BMI) of 45.0 to 49.9 in adult, unspecified obesity type ([place])  PLAN:  Diabetes II Stacey Clark has been given extensive diabetes education by myself today including ideal fasting and post-prandial blood glucose readings, individual ideal Hgb A1c goals  and hypoglycemia prevention. We discussed the importance of good blood sugar control to decrease the likelihood of diabetic complications such as nephropathy, neuropathy, limb loss, blindness, coronary artery disease, and death. We discussed the importance of intensive lifestyle modification including diet, exercise and weight loss as the first line treatment for diabetes. Stacey Clark agrees to continue Trulicity 3.76 mg subQ weekly #4 with no refills and follow up at the agreed upon time.  Vitamin D Deficiency Stacey Clark was informed that low vitamin D levels contributes to fatigue and are associated with obesity, breast, and colon cancer. She agrees to discontinue prescription Vit D @50 ,000 IU when prescription is finished and continue prescription until finished, then start OTC vitamin D @2 ,000 IU daily. Stacey Clark will follow up for routine testing of vitamin D, at least 2-3 times per year. She was informed of the risk of over-replacement of vitamin D and agrees to not increase her dose unless she discusses this with [place] first. Stacey Clark agrees to follow up as  directed.  Hyperlipidemia Stacey Clark was informed of the American Heart Association Guidelines emphasizing intensive lifestyle modifications as the first line treatment for hyperlipidemia. We discussed many lifestyle modifications today in depth, and Stacey Clark will continue to work on decreasing  saturated fats such as fatty red meat, butter and many fried foods. She will also increase vegetables and lean protein in her diet and continue to work on exercise and weight loss efforts. Stacey Clark agrees to continue Atorvastatin 10 mg daily #30 with no refills and follow up as directed.  Obesity Stacey Clark is currently in the action stage of change. As such, her goal is to continue with weight loss efforts She has agreed to follow the Category 3 plan Stacey Clark will continue her current exercise regimen for weight loss and overall health benefits. We discussed the following Behavioral Modification Strategies today: planning for success We discussed various medication options to help Stacey Clark with her weight loss efforts and we both agreed to continue Contrave 120 mg 2 pills daily BID #120 with no refills.  Stacey Clark has agreed to follow up with our clinic in 2 weeks. She was informed of the importance of frequent follow up visits to maximize her success with intensive lifestyle modifications for her multiple health conditions.  ALLERGIES: Allergies  Allergen Reactions   Cephalexin Swelling   Hydrocodone Nausea And Vomiting   Rifampin Nausea And Vomiting, Rash and Other (See Comments)    Nausea, Vomiting   Sulfa Antibiotics Hives   Augmentin [Amoxicillin-Pot Clavulanate] Nausea And Vomiting   Metformin And Related Other (See Comments)    GI upset   Penicillins Swelling    MEDICATIONS: Current Outpatient Medications on File Prior to Visit  Medication Sig Dispense Refill   ALPRAZolam (XANAX) 0.5 MG tablet Take 0.5 mg by mouth every morning.      cetirizine (ZYRTEC) 10 MG tablet Take 1 tablet (10 mg total) by mouth daily. (Patient not taking: Reported on 02/01/2019) 30 tablet 0   escitalopram (LEXAPRO) 5 MG tablet Take 5 mg by mouth daily.     Naltrexone-buPROPion HCl ER 8-90 MG TB12 Take 120 mg by mouth as directed. Take two tabs twice daily 120 tablet 0   pantoprazole (PROTONIX) 20 MG tablet  Take 1 tablet (20 mg total) by mouth daily. 30 tablet 0   Vitamin D, Ergocalciferol, (DRISDOL) 1.25 MG (50000 UT) CAPS capsule Take 1 capsule (50,000 Units total) by mouth every 3 (three) days. 10 capsule 0   No current facility-administered medications on file prior to visit.     PAST MEDICAL HISTORY: Past Medical History:  Diagnosis Date   Anxiety    Asthma    Back pain    Depression    Diabetes mellitus without complication (HCC)    GERD (gastroesophageal reflux disease)    HLD (hyperlipidemia)    IBS (irritable bowel syndrome)    Joint pain    Obesity    Sleep apnea     PAST SURGICAL HISTORY: Past Surgical History:  Procedure Laterality Date   CESAREAN SECTION     TONSILLECTOMY      SOCIAL HISTORY: Social History   Tobacco Use   Smoking status: Former Smoker    Last attempt to quit: 12/30/2006    Years since quitting: 12.3   Smokeless tobacco: Never Used  Substance Use Topics   Alcohol use: No    Alcohol/week: 0.0 standard drinks   Drug use: No    FAMILY HISTORY: Family History  Problem Relation Age of Onset   Hypertension  Mother    Cancer Mother    Depression Mother    Anxiety disorder Mother    Sleep apnea Mother    Obesity Mother    Hyperlipidemia Father    Cancer Father    Depression Father    Anxiety disorder Father    Bipolar disorder Father    Alcoholism Father    Obesity Father    Thyroid disease Neg Hx     ROS: Review of Systems  Constitutional: Negative for weight loss.  Respiratory: Negative for shortness of breath.   Cardiovascular: Negative for chest pain.  Gastrointestinal: Negative for nausea and vomiting.  Musculoskeletal:       Negative for muscle weakness  Endo/Heme/Allergies:       Negative for hypoglycemia    PHYSICAL EXAM: Pt in no acute distress  RECENT LABS AND TESTS: BMET    Component Value Date/Time   NA 139 04/08/2019 0923   K 4.4 04/08/2019 0923   CL 100 04/08/2019 0923    CO2 26 04/08/2019 0923   GLUCOSE 97 04/08/2019 0923   BUN 10 04/08/2019 0923   CREATININE 0.64 04/08/2019 0923   CALCIUM 8.9 04/08/2019 0923   GFRNONAA 105 04/08/2019 0923   GFRAA 121 04/08/2019 0923   Lab Results  Component Value Date   HGBA1C 6.4 (H) 04/08/2019   HGBA1C 6.2 (H) 12/08/2018   HGBA1C 6.3 (H) 09/02/2018   HGBA1C 6.8 (H) 05/14/2018   Lab Results  Component Value Date   INSULIN 21.1 04/08/2019   INSULIN 25.8 (H) 12/08/2018   INSULIN 20.0 09/02/2018   INSULIN 41.6 (H) 05/14/2018   CBC    Component Value Date/Time   WBC 10.0 05/14/2018 0919   WBC 13.3 (H) 01/15/2012 1237   RBC 4.69 05/14/2018 0919   RBC 4.90 01/15/2012 1237   HGB 13.2 05/14/2018 0919   HCT 39.8 05/14/2018 0919   PLT 326 01/15/2012 1237   MCV 85 05/14/2018 0919   MCH 28.1 05/14/2018 0919   MCH 29.6 01/15/2012 1237   MCHC 33.2 05/14/2018 0919   MCHC 34.0 01/15/2012 1237   RDW 14.4 05/14/2018 0919   LYMPHSABS 2.3 05/14/2018 0919   MONOABS 0.7 01/15/2012 1237   EOSABS 0.2 05/14/2018 0919   BASOSABS 0.1 05/14/2018 0919   Iron/TIBC/Ferritin/ %Sat No results found for: IRON, TIBC, FERRITIN, IRONPCTSAT Lipid Panel     Component Value Date/Time   CHOL 152 04/08/2019 0923   TRIG 92 04/08/2019 0923   HDL 52 04/08/2019 0923   LDLCALC 82 04/08/2019 0923   Hepatic Function Panel     Component Value Date/Time   PROT 6.3 04/08/2019 0923   ALBUMIN 3.9 04/08/2019 0923   AST 12 04/08/2019 0923   ALT 12 04/08/2019 0923   ALKPHOS 84 04/08/2019 0923   BILITOT 0.3 04/08/2019 0923      Component Value Date/Time   TSH 2.360 05/14/2018 0919     Ref. Range 04/08/2019 09:23  Vitamin D, 25-Hydroxy Latest Ref Range: 30.0 - 100.0 ng/mL 51.5    Stacey Clark, Stacey Clark, am acting as Location manager for [employer], FNP-C.  Stacey Clark have reviewed the above documentation for accuracy and completeness, and Stacey Clark agree with the above.  - Stacey Samonte, FNP-C.

## 2019-05-06 ENCOUNTER — Ambulatory Visit (INDEPENDENT_AMBULATORY_CARE_PROVIDER_SITE_OTHER): Payer: 59 | Admitting: Family Medicine

## 2019-05-06 ENCOUNTER — Encounter (INDEPENDENT_AMBULATORY_CARE_PROVIDER_SITE_OTHER): Payer: Self-pay | Admitting: Family Medicine

## 2019-05-06 ENCOUNTER — Other Ambulatory Visit: Payer: Self-pay

## 2019-05-06 DIAGNOSIS — Z6841 Body Mass Index (BMI) 40.0 and over, adult: Secondary | ICD-10-CM

## 2019-05-06 DIAGNOSIS — E119 Type 2 diabetes mellitus without complications: Secondary | ICD-10-CM | POA: Diagnosis not present

## 2019-05-06 DIAGNOSIS — E7849 Other hyperlipidemia: Secondary | ICD-10-CM

## 2019-05-06 MED ORDER — DULAGLUTIDE 0.75 MG/0.5ML ~~LOC~~ SOAJ: 0.7500 mg | SUBCUTANEOUS | 0 refills | Status: DC

## 2019-05-10 ENCOUNTER — Ambulatory Visit (INDEPENDENT_AMBULATORY_CARE_PROVIDER_SITE_OTHER): Payer: 59 | Admitting: Family Medicine

## 2019-05-10 ENCOUNTER — Encounter (INDEPENDENT_AMBULATORY_CARE_PROVIDER_SITE_OTHER): Payer: Self-pay | Admitting: Family Medicine

## 2019-05-10 NOTE — Progress Notes (Signed)
Office: [phone]  /  Fax: [phone] TeleHealth Visit:  Stacey Clark has verbally consented to this TeleHealth visit today. The patient is located at home, the provider is located at the [employer] [facility] office. The participants in this visit include the listed provider and patient. The visit was conducted today via FaceTime.  HPI:   Chief Complaint: OBESITY Stacey Clark is here to discuss her progress with her obesity treatment plan. She is on the Category 3 plan and is following her eating plan approximately 50% of the time. She states she is exercising 0 minutes 0 times per week. Stacey Clark is not eating all of her breakfast and lunch at times. We were unable to weigh the patient today for this TeleHealth visit. She feels as if she has gained 4 lbs since her last visit. She has lost 28 lbs since starting treatment with [place]. She is currently taking Contrave 2 pills twice daily and reports that it does help with appetite suppression.   Diabetes Mellitus Stacey Clark has a diagnosis of diabetes mellitus, well controlled. Hikari does not report checking her blood sugars and denies any hypoglycemic episodes. Last A1c was 6.4 on 04/08/2019. She has been working on intensive lifestyle modifications including diet, exercise, and weight loss to help control her blood glucose levels. No polyphagia.   Hyperlipidemia Stacey Clark has hyperlipidemia, well controlled on Lipitor. She has been trying to improve her cholesterol levels with intensive lifestyle modification including a low saturated fat diet, exercise and weight loss. Her LDL is at goal. She denies any chest pain or shortness of breath. The 10-year ASCVD risk score Stacey Bussing DC Jr., et al., 2013) is: 1.4%   Values used to calculate the score:     Age: 48 years     Sex: Female     Is Non-Hispanic African American: No     Diabetic: Yes     Tobacco smoker: No     Systolic Blood Pressure: 244 mmHg     Is BP treated: No     HDL Cholesterol: 52 mg/dL  Total Cholesterol: 152 mg/dL   ASSESSMENT AND PLAN:  Type 2 diabetes mellitus without complication, without long-term current use of insulin (HCC) - Plan: Dulaglutide (TRULICITY) 0.10 UV/2.5DG SOPN  Other hyperlipidemia  Class 3 severe obesity with serious comorbidity and body mass index (BMI) of 45.0 to 49.9 in adult, unspecified obesity type (HCC)  PLAN:  Diabetes Mellitus Stacey Clark has been given extensive diabetes education by myself today including ideal fasting and post-prandial blood glucose readings, individual ideal HgA1c goals  and hypoglycemia prevention. We discussed the importance of good blood sugar control to decrease the likelihood of diabetic complications such as nephropathy, neuropathy, limb loss, blindness, coronary artery disease, and death. We discussed the importance of intensive lifestyle modification including diet, exercise and weight loss as the first line treatment for diabetes. Stacey Clark was given a refill on her Trulicity 6.44 mg weekly #4 pens with 0 refills and agrees to follow-up with our clinic in 4 weeks.  Hyperlipidemia Stacey Clark was informed of the American Heart Association Guidelines emphasizing intensive lifestyle modifications as the first line treatment for hyperlipidemia. We discussed many lifestyle modifications today in depth, and Stacey Clark will continue to work on decreasing saturated fats such as fatty red meat, butter and many fried foods. She will continue atorvastatin, increase vegetables and lean protein in her diet, and continue to work on exercise and weight loss efforts.  Obesity Stacey Clark is currently in the action stage of change. As  such, her goal is to continue with weight loss efforts. She has agreed to follow the Category 3 plan. We discussed the following Behavioral Modification Strategies today: increasing lean protein intake, no skipping meals, and planning for success.  Stacey Clark was given a refill on her Contrave 2 pills BID #120 with 0 refills. She  agrees to follow-up with our clinic in 4 weeks.  Stacey Clark has agreed to follow-up with our clinic in 4 weeks. She was informed of the importance of frequent follow-up visits to maximize her success with intensive lifestyle modifications for her multiple health conditions.  ALLERGIES: Allergies  Allergen Reactions  . Cephalexin Swelling  . Hydrocodone Nausea And Vomiting  . Rifampin Nausea And Vomiting, Rash and Other (See Comments)    Nausea, Vomiting  . Sulfa Antibiotics Hives  . Augmentin [Amoxicillin-Pot Clavulanate] Nausea And Vomiting  . Metformin And Related Other (See Comments)    GI upset  . Penicillins Swelling    MEDICATIONS: Current Outpatient Medications on File Prior to Visit  Medication Sig Dispense Refill  . ALPRAZolam (XANAX) 0.5 MG tablet Take 0.5 mg by mouth every morning.     Marland Kitchen atorvastatin (LIPITOR) 10 MG tablet Take 1 tablet (10 mg total) by mouth daily. 90 tablet 0  . cetirizine (ZYRTEC) 10 MG tablet Take 1 tablet (10 mg total) by mouth daily. (Patient not taking: Reported on 02/01/2019) 30 tablet 0  . escitalopram (LEXAPRO) 5 MG tablet Take 5 mg by mouth daily.    . Naltrexone-buPROPion HCl ER 8-90 MG TB12 Take 120 mg by mouth as directed. Take two tabs twice daily 120 tablet 0  . pantoprazole (PROTONIX) 20 MG tablet Take 1 tablet (20 mg total) by mouth daily. 30 tablet 0   No current facility-administered medications on file prior to visit.     PAST MEDICAL HISTORY: Past Medical History:  Diagnosis Date  . Anxiety   . Asthma   . Back pain   . Depression   . Diabetes mellitus without complication ([place])   . GERD (gastroesophageal reflux disease)   . HLD (hyperlipidemia)   . IBS (irritable bowel syndrome)   . Joint pain   . Obesity   . Sleep apnea     PAST SURGICAL HISTORY: Past Surgical History:  Procedure Laterality Date  . CESAREAN SECTION    . TONSILLECTOMY      SOCIAL HISTORY: Social History   Tobacco Use  . Smoking status: Former Smoker     Last attempt to quit: 12/30/2006    Years since quitting: 12.3  . Smokeless tobacco: Never Used  Substance Use Topics  . Alcohol use: No    Alcohol/week: 0.0 standard drinks  . Drug use: No    FAMILY HISTORY: Family History  Problem Relation Age of Onset  . Hypertension Mother   . Cancer Mother   . Depression Mother   . Anxiety disorder Mother   . Sleep apnea Mother   . Obesity Mother   . Hyperlipidemia Father   . Cancer Father   . Depression Father   . Anxiety disorder Father   . Bipolar disorder Father   . Alcoholism Father   . Obesity Father   . Thyroid disease Neg Hx    ROS: Review of Systems  Respiratory: Negative for shortness of breath.   Cardiovascular: Negative for chest pain.  Endo/Heme/Allergies:       Negative for polyphagia. Negative for hypoglycemia.   PHYSICAL EXAM: Pt in no acute distress  RECENT LABS AND TESTS:  BMET    Component Value Date/Time   NA 139 04/08/2019 0923   K 4.4 04/08/2019 0923   CL 100 04/08/2019 0923   CO2 26 04/08/2019 0923   GLUCOSE 97 04/08/2019 0923   BUN 10 04/08/2019 0923   CREATININE 0.64 04/08/2019 0923   CALCIUM 8.9 04/08/2019 0923   GFRNONAA 105 04/08/2019 0923   GFRAA 121 04/08/2019 0923   Lab Results  Component Value Date   HGBA1C 6.4 (H) 04/08/2019   HGBA1C 6.2 (H) 12/08/2018   HGBA1C 6.3 (H) 09/02/2018   HGBA1C 6.8 (H) 05/14/2018   Lab Results  Component Value Date   INSULIN 21.1 04/08/2019   INSULIN 25.8 (H) 12/08/2018   INSULIN 20.0 09/02/2018   INSULIN 41.6 (H) 05/14/2018   CBC    Component Value Date/Time   WBC 10.0 05/14/2018 0919   WBC 13.3 (H) 01/15/2012 1237   RBC 4.69 05/14/2018 0919   RBC 4.90 01/15/2012 1237   HGB 13.2 05/14/2018 0919   HCT 39.8 05/14/2018 0919   PLT 326 01/15/2012 1237   MCV 85 05/14/2018 0919   MCH 28.1 05/14/2018 0919   MCH 29.6 01/15/2012 1237   MCHC 33.2 05/14/2018 0919   MCHC 34.0 01/15/2012 1237   RDW 14.4 05/14/2018 0919   LYMPHSABS 2.3 05/14/2018  0919   MONOABS 0.7 01/15/2012 1237   EOSABS 0.2 05/14/2018 0919   BASOSABS 0.1 05/14/2018 0919   Iron/TIBC/Ferritin/ %Sat No results found for: IRON, TIBC, FERRITIN, IRONPCTSAT Lipid Panel     Component Value Date/Time   CHOL 152 04/08/2019 0923   TRIG 92 04/08/2019 0923   HDL 52 04/08/2019 0923   LDLCALC 82 04/08/2019 0923   Hepatic Function Panel     Component Value Date/Time   PROT 6.3 04/08/2019 0923   ALBUMIN 3.9 04/08/2019 0923   AST 12 04/08/2019 0923   ALT 12 04/08/2019 0923   ALKPHOS 84 04/08/2019 0923   BILITOT 0.3 04/08/2019 0923      Component Value Date/Time   TSH 2.360 05/14/2018 0919   Results for ERMALINDA, JOUBERT (MRN [id number]) as of 05/10/2019 11:04  Ref. Range 04/08/2019 09:23  Vitamin D, 25-Hydroxy Latest Ref Range: 30.0 - 100.0 ng/mL 51.5    I, Michaelene Song, am acting as Location manager for [employer], FNP-C.  I have reviewed the above documentation for accuracy and completeness, and I agree with the above.  - Halynn Reitano, FNP-C.

## 2019-05-12 ENCOUNTER — Other Ambulatory Visit (INDEPENDENT_AMBULATORY_CARE_PROVIDER_SITE_OTHER): Payer: Self-pay | Admitting: Family Medicine

## 2019-05-12 ENCOUNTER — Encounter (INDEPENDENT_AMBULATORY_CARE_PROVIDER_SITE_OTHER): Payer: Self-pay | Admitting: Family Medicine

## 2019-05-12 DIAGNOSIS — Z6841 Body Mass Index (BMI) 40.0 and over, adult: Secondary | ICD-10-CM

## 2019-05-12 MED ORDER — NALTREXONE-BUPROPION HCL ER 8-90 MG PO TB12: 120.0000 mg | ORAL_TABLET | ORAL | 0 refills | Status: DC

## 2019-05-13 MED FILL — PANTOPRAZOLE SOD DR 20 MG T: 20 | 90 days supply | Qty: 90 | Fill #0

## 2019-05-15 MED FILL — CONTRAVE ER 8-90 MG TABLET: 8-90 | 30 days supply | Qty: 120 | Fill #0

## 2019-05-17 ENCOUNTER — Encounter (INDEPENDENT_AMBULATORY_CARE_PROVIDER_SITE_OTHER): Payer: Self-pay

## 2019-05-25 ENCOUNTER — Other Ambulatory Visit: Payer: Self-pay | Admitting: Family Medicine

## 2019-05-25 DIAGNOSIS — Z1231 Encounter for screening mammogram for malignant neoplasm of breast: Secondary | ICD-10-CM

## 2019-05-31 ENCOUNTER — Ambulatory Visit
Admission: RE | Admit: 2019-05-31 | Discharge: 2019-05-31 | Disposition: A | Discharge status: Home / Self Care | Payer: 59 | Source: Ambulatory Visit | Attending: Family Medicine | Admitting: Family Medicine

## 2019-05-31 ENCOUNTER — Encounter (INDEPENDENT_AMBULATORY_CARE_PROVIDER_SITE_OTHER): Payer: Self-pay | Admitting: Family Medicine

## 2019-05-31 ENCOUNTER — Other Ambulatory Visit: Payer: Self-pay

## 2019-05-31 ENCOUNTER — Ambulatory Visit (INDEPENDENT_AMBULATORY_CARE_PROVIDER_SITE_OTHER): Payer: 59 | Admitting: Family Medicine

## 2019-05-31 DIAGNOSIS — E559 Vitamin D deficiency, unspecified: Secondary | ICD-10-CM | POA: Diagnosis not present

## 2019-05-31 DIAGNOSIS — Z6841 Body Mass Index (BMI) 40.0 and over, adult: Secondary | ICD-10-CM

## 2019-05-31 DIAGNOSIS — E119 Type 2 diabetes mellitus without complications: Secondary | ICD-10-CM | POA: Diagnosis not present

## 2019-05-31 DIAGNOSIS — Z1231 Encounter for screening mammogram for malignant neoplasm of breast: Secondary | ICD-10-CM | POA: Diagnosis not present

## 2019-05-31 MED ORDER — DULAGLUTIDE 0.75 MG/0.5ML ~~LOC~~ SOAJ: 0.7500 mg | SUBCUTANEOUS | 0 refills | Status: DC

## 2019-05-31 MED FILL — TRULICITY 0.75 MG/0.5 ML PE: 0.75 | 28 days supply | Qty: 2 | Fill #0

## 2019-06-01 ENCOUNTER — Encounter (INDEPENDENT_AMBULATORY_CARE_PROVIDER_SITE_OTHER): Payer: Self-pay | Admitting: Family Medicine

## 2019-06-01 NOTE — Progress Notes (Signed)
Office: [phone]  /  Fax: [phone] TeleHealth Visit:  Stacey Clark has verbally consented to this TeleHealth visit today. The patient is located in her car, the provider is located at the [facility] office. The participants in this visit include the listed provider and patient. The visit was conducted today via FaceTime.  HPI:   Chief Complaint: OBESITY Stacey Clark is here to discuss her progress with her obesity treatment plan. She is on the Category 3 plan and is following her eating plan approximately 50% of the time. She states she is walking 4-5 miles 5 times per week. Stacey Clark states she started back to work and is skipping meals due to lack of time and absence of hunger. 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 She denies polyphagia. She is taking 2 Contrave in the a.m. We were unable to weigh the patient today for this TeleHealth visit. She states her weight at home was 256 lbs on 05/31/2019. She has lost 28 lbs since starting treatment with [place].  Diabetes Mellitus Stacey Clark has a diagnosis of diabetes mellitus, well controlled on Trulicity 2.67 mg weekly. Stacey Clark states fasting blood sugars range between 90 and 95 and low 100's at lunch. Last A1c was 6.4 on 04/08/2019. She has been working on intensive lifestyle modifications including diet, exercise, and weight loss to help control her blood glucose levels.  Vitamin D deficiency Stacey Clark has a diagnosis of Vitamin D deficiency and her level is currently at goal. She is currently taking OTC Vit D and denies nausea, vomiting or muscle weakness.  ASSESSMENT AND PLAN:  Type 2 diabetes mellitus without complication, without long-term current use of insulin (HCC) - Plan: Dulaglutide (TRULICITY) 1.24 PY/0.9XI SOPN  Vitamin D deficiency  Class 3 severe obesity with serious comorbidity and body mass index (BMI) of 45.0 to 49.9 in adult, unspecified obesity type (HCC)  PLAN:  Diabetes Mellitus Stacey Clark has been given  extensive diabetes education by myself today including ideal fasting and post-prandial blood glucose readings, individual ideal Hgb A1c goals  and hypoglycemia prevention. We discussed the importance of good blood sugar control to decrease the likelihood of diabetic complications such as nephropathy, neuropathy, limb loss, blindness, coronary artery disease, and death. We discussed the importance of intensive lifestyle modification including diet, exercise and weight loss as the first line treatment for diabetes. Stacey Clark was given a refill on her Trulicity 3.38 mg weekly #4 pens with 0 refills. She agrees to follow-up with our clinic in 2 weeks for an in office visit and we will check A1c.  Vitamin D Deficiency Stacey Clark was informed that low Vitamin D levels contributes to fatigue and are associated with obesity, breast, and colon cancer. She agrees to continue taking OTC Vit D 2000 IU daily and will follow-up for routine testing of Vitamin D, at least 2-3 times per year. She was informed of the risk of over-replacement of Vitamin D and agrees to not increase her dose unless she discusses this with [place] first. Fable agrees to follow-up with our clinic in 2 weeks and we will check her vitamin D level at that time.  Obesity Stacey Clark is currently in the action stage of change. As such, her goal is to continue with weight loss efforts. She has agreed to follow the Category 3 plan. Stacey Clark will decrease her Contrave dose to 1 pill in the a.m. Stacey Clark has been instructed to continue her current exercise regimen for weight loss and overall health benefits. We discussed  the following Behavioral Modification Strategies today: increasing lean protein intake, stop skipping meals, and planning for success.  Stacey Clark has agreed to follow-up with our clinic in 2 weeks. She was informed of the importance of frequent follow-up visits to maximize her success with intensive lifestyle modifications for her multiple health conditions.   ALLERGIES: Allergies  Allergen Reactions  . Cephalexin Swelling  . Hydrocodone Nausea And Vomiting  . Rifampin Nausea And Vomiting, Rash and Other (See Comments)    Nausea, Vomiting  . Sulfa Antibiotics Hives  . Augmentin [Amoxicillin-Pot Clavulanate] Nausea And Vomiting  . Metformin And Related Other (See Comments)    GI upset  . Penicillins Swelling    MEDICATIONS: Current Outpatient Medications on File Prior to Visit  Medication Sig Dispense Refill  . cholecalciferol (VITAMIN D3) 25 MCG (1000 UT) tablet Take 2,000 Units by mouth daily.    Marland Kitchen ALPRAZolam (XANAX) 0.5 MG tablet Take 0.5 mg by mouth every morning.     Marland Kitchen atorvastatin (LIPITOR) 10 MG tablet Take 1 tablet (10 mg total) by mouth daily. 90 tablet 0  . cetirizine (ZYRTEC) 10 MG tablet Take 1 tablet (10 mg total) by mouth daily. (Patient not taking: Reported on 02/01/2019) 30 tablet 0  . escitalopram (LEXAPRO) 5 MG tablet Take 5 mg by mouth daily.    . Naltrexone-buPROPion HCl ER 8-90 MG TB12 Take 120 mg by mouth as directed. Take two tabs twice daily 120 tablet 0  . pantoprazole (PROTONIX) 20 MG tablet Take 1 tablet (20 mg total) by mouth daily. 30 tablet 0   No current facility-administered medications on file prior to visit.     PAST MEDICAL HISTORY: Past Medical History:  Diagnosis Date  . Anxiety   . Asthma   . Back pain   . Depression   . Diabetes mellitus without complication ([place])   . GERD (gastroesophageal reflux disease)   . HLD (hyperlipidemia)   . IBS (irritable bowel syndrome)   . Joint pain   . Obesity   . Sleep apnea     PAST SURGICAL HISTORY: Past Surgical History:  Procedure Laterality Date  . CESAREAN SECTION    . TONSILLECTOMY      SOCIAL HISTORY: Social History   Tobacco Use  . Smoking status: Former Smoker    Last attempt to quit: 12/30/2006    Years since quitting: 12.4  . Smokeless tobacco: Never Used  Substance Use Topics  . Alcohol use: No    Alcohol/week: 0.0 standard drinks   . Drug use: No    FAMILY HISTORY: Family History  Problem Relation Age of Onset  . Hypertension Mother   . Cancer Mother   . Depression Mother   . Anxiety disorder Mother   . Sleep apnea Mother   . Obesity Mother   . Hyperlipidemia Father   . Cancer Father   . Depression Father   . Anxiety disorder Father   . Bipolar disorder Father   . Alcoholism Father   . Obesity Father   . Thyroid disease Neg Hx    ROS: Review of Systems  Gastrointestinal: Negative for nausea and vomiting.  Musculoskeletal:       Negative for muscle weakness.   PHYSICAL EXAM: Pt in no acute distress  RECENT LABS AND TESTS: BMET    Component Value Date/Time   NA 139 04/08/2019 0923   K 4.4 04/08/2019 0923   CL 100 04/08/2019 0923   CO2 26 04/08/2019 0923   GLUCOSE 97 04/08/2019 0923  BUN 10 04/08/2019 0923   CREATININE 0.64 04/08/2019 0923   CALCIUM 8.9 04/08/2019 0923   GFRNONAA 105 04/08/2019 0923   GFRAA 121 04/08/2019 0923   Lab Results  Component Value Date   HGBA1C 6.4 (H) 04/08/2019   HGBA1C 6.2 (H) 12/08/2018   HGBA1C 6.3 (H) 09/02/2018   HGBA1C 6.8 (H) 05/14/2018   Lab Results  Component Value Date   INSULIN 21.1 04/08/2019   INSULIN 25.8 (H) 12/08/2018   INSULIN 20.0 09/02/2018   INSULIN 41.6 (H) 05/14/2018   CBC    Component Value Date/Time   WBC 10.0 05/14/2018 0919   WBC 13.3 (H) 01/15/2012 1237   RBC 4.69 05/14/2018 0919   RBC 4.90 01/15/2012 1237   HGB 13.2 05/14/2018 0919   HCT 39.8 05/14/2018 0919   PLT 326 01/15/2012 1237   MCV 85 05/14/2018 0919   MCH 28.1 05/14/2018 0919   MCH 29.6 01/15/2012 1237   MCHC 33.2 05/14/2018 0919   MCHC 34.0 01/15/2012 1237   RDW 14.4 05/14/2018 0919   LYMPHSABS 2.3 05/14/2018 0919   MONOABS 0.7 01/15/2012 1237   EOSABS 0.2 05/14/2018 0919   BASOSABS 0.1 05/14/2018 0919   Iron/TIBC/Ferritin/ %Sat No results found for: IRON, TIBC, FERRITIN, IRONPCTSAT Lipid Panel     Component Value Date/Time   CHOL 152 04/08/2019  0923   TRIG 92 04/08/2019 0923   HDL 52 04/08/2019 0923   LDLCALC 82 04/08/2019 0923   Hepatic Function Panel     Component Value Date/Time   PROT 6.3 04/08/2019 0923   ALBUMIN 3.9 04/08/2019 0923   AST 12 04/08/2019 0923   ALT 12 04/08/2019 0923   ALKPHOS 84 04/08/2019 0923   BILITOT 0.3 04/08/2019 0923      Component Value Date/Time   TSH 2.360 05/14/2018 0919   Results for GENEVER, HENTGES (MRN [id number]) as of 06/01/2019 07:55  Ref. Range 04/08/2019 09:23  Vitamin D, 25-Hydroxy Latest Ref Range: 30.0 - 100.0 ng/mL 51.5    I, Michaelene Song, am acting as Location manager for [employer], FNP-C.  I have reviewed the above documentation for accuracy and completeness, and I agree with the above.  - Elizebath Wever, FNP-C.

## 2019-06-14 ENCOUNTER — Encounter (INDEPENDENT_AMBULATORY_CARE_PROVIDER_SITE_OTHER): Payer: Self-pay | Admitting: Family Medicine

## 2019-06-14 ENCOUNTER — Ambulatory Visit (INDEPENDENT_AMBULATORY_CARE_PROVIDER_SITE_OTHER): Payer: 59 | Admitting: Family Medicine

## 2019-06-14 ENCOUNTER — Other Ambulatory Visit: Payer: Self-pay

## 2019-06-14 VITALS — BP 122/77 | HR 71 | Temp 98.1°F | Ht 61.0 in | Wt 260.0 lb

## 2019-06-14 DIAGNOSIS — Z6841 Body Mass Index (BMI) 40.0 and over, adult: Secondary | ICD-10-CM

## 2019-06-14 DIAGNOSIS — F3289 Other specified depressive episodes: Secondary | ICD-10-CM | POA: Diagnosis not present

## 2019-06-14 DIAGNOSIS — E119 Type 2 diabetes mellitus without complications: Secondary | ICD-10-CM

## 2019-06-14 MED ORDER — BUPROPION HCL ER (SR) 150 MG PO TB12: 150.0000 mg | ORAL_TABLET | Freq: Two times a day (BID) | ORAL | 0 refills | Status: DC

## 2019-06-14 MED ORDER — TRULICITY 0.75 MG/0.5ML ~~LOC~~ SOAJ: 0.7500 mg | SUBCUTANEOUS | 0 refills | Status: DC

## 2019-06-14 MED FILL — BUPROPION HCL SR 150 MG TAB: 150 | 30 days supply | Qty: 60 | Fill #0

## 2019-06-15 NOTE — Progress Notes (Signed)
Office: [phone]  /  Fax: [phone]   HPI:   Chief Complaint: OBESITY Stacey Clark is here to discuss her progress with her obesity treatment plan. She is on the Category 3 plan and is following her eating plan approximately 75 % of the time. She states she is walking for 30 minutes 3 times per week. Stacey Clark has been skipping lunch 25 % of the time. She has had stress eating over the past few months, and also has skipped meals.  Her weight is 260 lb (117.9 kg) today and has gained 11 lbs since her last visit. She has lost 17 lbs since starting treatment with [place].  Diabetes II Stacey Clark has a diagnosis of diabetes type II. Stacey Clark states her CBGs range between 90 and 115. She denies polyphagia or hypoglycemia. Last A1c was 6.4. She has been working on intensive lifestyle modifications including diet, exercise, and weight loss to help control her blood glucose levels.  Depression with Emotional Eating Behaviors Stacey Clark denies stress eating at this point in time.  She has been working on behavior modification techniques to help reduce her emotional eating and has been somewhat successful. She shows no sign of suicidal or homicidal ideations.  Depression screen [facility] 2/9 05/14/2018 12/20/2015  Decreased Interest 3 0  Down, Depressed, Hopeless 2 0  PHQ - 2 Score 5 0  Altered sleeping 3 -  Tired, decreased energy 3 -  Change in appetite 3 -  Feeling bad or failure about yourself  0 -  Trouble concentrating 1 -  Moving slowly or fidgety/restless 0 -  Suicidal thoughts 0 -  PHQ-9 Score 15 -  Difficult doing work/chores Not difficult at all -    ASSESSMENT AND PLAN:  Type 2 diabetes mellitus without complication, without long-term current use of insulin (HCC) - Plan: Dulaglutide (TRULICITY) 3.73 SK/8.7GO SOPN  Other depression - with emotional eating - Plan: buPROPion (WELLBUTRIN SR) 150 MG 12 hr tablet  Class 3 severe obesity with serious comorbidity and body mass index (BMI) of 45.0 to 49.9 in  adult, unspecified obesity type (HCC)  PLAN:  Diabetes II Stacey Clark has been given extensive diabetes education by myself today including ideal fasting and post-prandial blood glucose readings, individual ideal Hgb A1c goals and hypoglycemia prevention. We discussed the importance of good blood sugar control to decrease the likelihood of diabetic complications such as nephropathy, neuropathy, limb loss, blindness, coronary artery disease, and death. We discussed the importance of intensive lifestyle modification including diet, exercise and weight loss as the first line treatment for diabetes. Stacey Clark agrees to continue Trulicity 1.15 mg SubQ weekly #4 pens and we will refill for 1 month. Stacey Clark agrees to follow up with our clinic in 2 weeks.  Depression with Emotional Eating Behaviors We discussed behavior modification techniques today to help Stacey Clark deal with her emotional eating and depression. Stacey Clark agrees to start bupropion 150 mg BID #60 with no refills. Stacey Clark agrees to follow up with our clinic in 2 weeks.  Obesity Stacey Clark is currently in the action stage of change. As such, her goal is to continue with weight loss efforts She has agreed to follow the Category 3 plan Stacey Clark has been instructed to work up to a goal of 150 minutes of combined cardio and strengthening exercise per week or continue current regimen for weight loss and overall health benefits. We discussed the following Behavioral Modification Strategies today: increasing lean protein intake and work on meal planning and easy cooking plans, no skipping meals, and planning  for success We discussed various medication options to help Stacey Clark with her weight loss efforts and we both agreed to discontinue Contrave, as it is making her anxious due to decrease in bupropion dose. She was on bupropion before starting Contrave.   Stacey Clark has agreed to follow up with our clinic in 2 weeks. She was informed of the importance of frequent follow up visits to  maximize her success with intensive lifestyle modifications for her multiple health conditions.  ALLERGIES: Allergies  Allergen Reactions  . Cephalexin Swelling  . Hydrocodone Nausea And Vomiting  . Rifampin Nausea And Vomiting, Rash and Other (See Comments)    Nausea, Vomiting  . Sulfa Antibiotics Hives  . Augmentin [Amoxicillin-Pot Clavulanate] Nausea And Vomiting  . Metformin And Related Other (See Comments)    GI upset  . Penicillins Swelling    MEDICATIONS: Current Outpatient Medications on File Prior to Visit  Medication Sig Dispense Refill  . ALPRAZolam (XANAX) 0.5 MG tablet Take 0.5 mg by mouth every morning.     Marland Kitchen atorvastatin (LIPITOR) 10 MG tablet Take 1 tablet (10 mg total) by mouth daily. 90 tablet 0  . cetirizine (ZYRTEC) 10 MG tablet Take 1 tablet (10 mg total) by mouth daily. (Patient not taking: Reported on 02/01/2019) 30 tablet 0  . cholecalciferol (VITAMIN D3) 25 MCG (1000 UT) tablet Take 2,000 Units by mouth daily.    Marland Kitchen escitalopram (LEXAPRO) 5 MG tablet Take 5 mg by mouth daily.    . Naltrexone-buPROPion HCl ER 8-90 MG TB12 Take 120 mg by mouth as directed. Take two tabs twice daily 120 tablet 0  . pantoprazole (PROTONIX) 20 MG tablet Take 1 tablet (20 mg total) by mouth daily. 30 tablet 0   No current facility-administered medications on file prior to visit.     PAST MEDICAL HISTORY: Past Medical History:  Diagnosis Date  . Anxiety   . Asthma   . Back pain   . Depression   . Diabetes mellitus without complication ([place])   . GERD (gastroesophageal reflux disease)   . HLD (hyperlipidemia)   . IBS (irritable bowel syndrome)   . Joint pain   . Obesity   . Sleep apnea     PAST SURGICAL HISTORY: Past Surgical History:  Procedure Laterality Date  . CESAREAN SECTION    . TONSILLECTOMY      SOCIAL HISTORY: Social History   Tobacco Use  . Smoking status: Former Smoker    Quit date: 12/30/2006    Years since quitting: 12.4  . Smokeless tobacco: Never  Used  Substance Use Topics  . Alcohol use: No    Alcohol/week: 0.0 standard drinks  . Drug use: No    FAMILY HISTORY: Family History  Problem Relation Age of Onset  . Hypertension Mother   . Cancer Mother   . Depression Mother   . Anxiety disorder Mother   . Sleep apnea Mother   . Obesity Mother   . Hyperlipidemia Father   . Cancer Father   . Depression Father   . Anxiety disorder Father   . Bipolar disorder Father   . Alcoholism Father   . Obesity Father   . Thyroid disease Neg Hx     ROS: Review of Systems  Constitutional: Negative for weight loss.  Endo/Heme/Allergies:       Negative hypoglycemia Negative polyphagia  Psychiatric/Behavioral: Positive for depression. Negative for suicidal ideas.    PHYSICAL EXAM: Blood pressure 122/77, pulse 71, temperature 98.1 F (36.7 C), temperature source Oral,  height 5\' 1"  (1.549 m), weight 260 lb (117.9 kg), SpO2 97 %. Body mass index is 49.13 kg/m. Physical Exam Vitals signs reviewed.  Constitutional:      Appearance: Normal appearance. She is obese.  Cardiovascular:     Rate and Rhythm: Normal rate.     Pulses: Normal pulses.  Pulmonary:     Effort: Pulmonary effort is normal.     Breath sounds: Normal breath sounds.  Musculoskeletal: Normal range of motion.  Skin:    General: Skin is warm and dry.  Neurological:     Mental Status: She is alert and oriented to person, place, and time.  Psychiatric:        Mood and Affect: Mood normal.        Behavior: Behavior normal.     RECENT LABS AND TESTS: BMET    Component Value Date/Time   NA 139 04/08/2019 0923   K 4.4 04/08/2019 0923   CL 100 04/08/2019 0923   CO2 26 04/08/2019 0923   GLUCOSE 97 04/08/2019 0923   BUN 10 04/08/2019 0923   CREATININE 0.64 04/08/2019 0923   CALCIUM 8.9 04/08/2019 0923   GFRNONAA 105 04/08/2019 0923   GFRAA 121 04/08/2019 0923   Lab Results  Component Value Date   HGBA1C 6.4 (H) 04/08/2019   HGBA1C 6.2 (H) 12/08/2018    HGBA1C 6.3 (H) 09/02/2018   HGBA1C 6.8 (H) 05/14/2018   Lab Results  Component Value Date   INSULIN 21.1 04/08/2019   INSULIN 25.8 (H) 12/08/2018   INSULIN 20.0 09/02/2018   INSULIN 41.6 (H) 05/14/2018   CBC    Component Value Date/Time   WBC 10.0 05/14/2018 0919   WBC 13.3 (H) 01/15/2012 1237   RBC 4.69 05/14/2018 0919   RBC 4.90 01/15/2012 1237   HGB 13.2 05/14/2018 0919   HCT 39.8 05/14/2018 0919   PLT 326 01/15/2012 1237   MCV 85 05/14/2018 0919   MCH 28.1 05/14/2018 0919   MCH 29.6 01/15/2012 1237   MCHC 33.2 05/14/2018 0919   MCHC 34.0 01/15/2012 1237   RDW 14.4 05/14/2018 0919   LYMPHSABS 2.3 05/14/2018 0919   MONOABS 0.7 01/15/2012 1237   EOSABS 0.2 05/14/2018 0919   BASOSABS 0.1 05/14/2018 0919   Iron/TIBC/Ferritin/ %Sat No results found for: IRON, TIBC, FERRITIN, IRONPCTSAT Lipid Panel     Component Value Date/Time   CHOL 152 04/08/2019 0923   TRIG 92 04/08/2019 0923   HDL 52 04/08/2019 0923   LDLCALC 82 04/08/2019 0923   Hepatic Function Panel     Component Value Date/Time   PROT 6.3 04/08/2019 0923   ALBUMIN 3.9 04/08/2019 0923   AST 12 04/08/2019 0923   ALT 12 04/08/2019 0923   ALKPHOS 84 04/08/2019 0923   BILITOT 0.3 04/08/2019 0923      Component Value Date/Time   TSH 2.360 05/14/2018 0919      OBESITY BEHAVIORAL INTERVENTION VISIT  Today's visit was # 21   Starting weight: 277 lbs Starting date: 05/14/18 Today's weight : 260 lbs  Today's date: 06/14/2019 Total lbs lost to date: 9    ASK: We discussed the diagnosis of obesity with Julieta Gutting today and Jezebel agreed to give [place] permission to discuss obesity behavioral modification therapy today.  ASSESS: Emi has the diagnosis of obesity and her BMI today is 49.15 Annalysia is in the action stage of change   ADVISE: Tennille was educated on the multiple health risks of obesity as well as the benefit of  weight loss to improve her health. She was advised of the need for long term  treatment and the importance of lifestyle modifications to improve her current health and to decrease her risk of future health problems.  AGREE: Multiple dietary modification options and treatment options were discussed and  Gwenivere agreed to follow the recommendations documented in the above note.  ARRANGE: Donell was educated on the importance of frequent visits to treat obesity as outlined per CMS and USPSTF guidelines and agreed to schedule her next follow up appointment today.  Stacey Clark, am acting as Location manager for [employer], FNP-C.  I have reviewed the above documentation for accuracy and completeness, and I agree with the above.  - Lilas Diefendorf, FNP-C.

## 2019-06-21 ENCOUNTER — Encounter (INDEPENDENT_AMBULATORY_CARE_PROVIDER_SITE_OTHER): Payer: Self-pay | Admitting: Family Medicine

## 2019-06-21 DIAGNOSIS — F32A Depression, unspecified: Secondary | ICD-10-CM | POA: Insufficient documentation

## 2019-06-21 DIAGNOSIS — F329 Major depressive disorder, single episode, unspecified: Secondary | ICD-10-CM | POA: Insufficient documentation

## 2019-06-28 ENCOUNTER — Ambulatory Visit (INDEPENDENT_AMBULATORY_CARE_PROVIDER_SITE_OTHER): Payer: 59 | Admitting: Family Medicine

## 2019-06-28 ENCOUNTER — Other Ambulatory Visit: Payer: Self-pay

## 2019-06-28 VITALS — BP 113/75 | HR 61 | Temp 97.9°F | Ht 61.0 in | Wt 261.0 lb

## 2019-06-28 DIAGNOSIS — F3289 Other specified depressive episodes: Secondary | ICD-10-CM

## 2019-06-28 DIAGNOSIS — E7849 Other hyperlipidemia: Secondary | ICD-10-CM

## 2019-06-28 DIAGNOSIS — Z9189 Other specified personal risk factors, not elsewhere classified: Secondary | ICD-10-CM | POA: Diagnosis not present

## 2019-06-28 DIAGNOSIS — Z6841 Body Mass Index (BMI) 40.0 and over, adult: Secondary | ICD-10-CM | POA: Diagnosis not present

## 2019-06-28 DIAGNOSIS — E119 Type 2 diabetes mellitus without complications: Secondary | ICD-10-CM | POA: Diagnosis not present

## 2019-06-28 DIAGNOSIS — K219 Gastro-esophageal reflux disease without esophagitis: Secondary | ICD-10-CM | POA: Diagnosis not present

## 2019-06-28 MED ORDER — BUPROPION HCL ER (SR) 150 MG PO TB12: 150.0000 mg | ORAL_TABLET | Freq: Two times a day (BID) | ORAL | 0 refills | Status: AC

## 2019-06-28 MED ORDER — TRULICITY 0.75 MG/0.5ML ~~LOC~~ SOAJ: 0.7500 mg | SUBCUTANEOUS | 0 refills | Status: DC

## 2019-06-28 MED ORDER — PANTOPRAZOLE SODIUM 20 MG PO TBEC: 20.0000 mg | DELAYED_RELEASE_TABLET | Freq: Every day | ORAL | 0 refills | Status: AC

## 2019-06-28 MED ORDER — ATORVASTATIN CALCIUM 10 MG PO TABS: 10.0000 mg | ORAL_TABLET | Freq: Every day | ORAL | 0 refills | Status: DC

## 2019-06-28 MED FILL — TRULICITY 0.75 MG/0.5 ML PE: 0.75 | 28 days supply | Qty: 2 | Fill #0

## 2019-06-29 ENCOUNTER — Encounter (INDEPENDENT_AMBULATORY_CARE_PROVIDER_SITE_OTHER): Payer: Self-pay | Admitting: Family Medicine

## 2019-06-29 NOTE — Progress Notes (Signed)
Office: [phone]  /  Fax: [phone]   HPI:   Chief Complaint: OBESITY Stacey Clark is here to discuss her progress with her obesity treatment plan. She is on the Category 3 plan and is following her eating plan approximately 90% of the time. She states she is walking 11,000-12,000 steps a day 4 times per week. Stacey Clark states her water weight is increased today. She reports craving sweets but has done well on the plan. Her weight is 261 lb (118.4 kg) today and has had a weight gain of 1 pound over a period of 2 weeks since her last visit. She has lost 16 lbs since starting treatment with [place].  Diabetes Mellitus Stacey Clark has a diagnosis of diabetes mellitus. [name] states she checks her blood sugars sporadically with highs 135 and lows 90. She denies any hypoglycemic episodes. Last A1c was 6.4 on 04/08/2019. She has been working on intensive lifestyle modifications including diet, exercise, and weight loss to help control her blood glucose levels.  Gastroesophageal Reflux Disease (GERD) Stacey Clark has gastroesophageal reflux disease (GERD), which is well controlled. She has had esophageal dilatation secondary to GERD and follows with her GI physician PRN.  Hyperlipidemia Stacey Clark has hyperlipidemia and is on a statin. She has been trying to improve her cholesterol levels with intensive lifestyle modification including a low saturated fat diet, exercise and weight loss. LDL is at goal. She denies any chest pain or shortness or breath. The 10-year ASCVD risk score Stacey Clark DC [name], et al., 2013) is: 1.3%   Values used to calculate the score:     Age: 19 years     Sex: Female     Is Non-Hispanic African American: No     Diabetic: Yes     Tobacco smoker: No     Systolic Blood Pressure: 562 mmHg     Is BP treated: No     HDL Cholesterol: 52 mg/dL     Total Cholesterol: 152 mg/dL   At risk for cardiovascular disease Stacey Clark is at a higher than average risk for cardiovascular disease due to obesity. She  currently denies any chest pain.  Depression with emotional eating behaviors Stacey Clark is struggling with emotional eating and using food for comfort to the extent that it is negatively impacting her health. She often snacks when she is not hungry. Stacey Clark sometimes feels she is out of control and then feels guilty that she made poor food choices. She has been working on behavior modification techniques to help reduce her emotional eating and has been somewhat successful. Stacey Clark reports craving sweets. She was taking bupropion once daily and has not increased to BID as previously discussed . She shows no sign of suicidal or homicidal ideations.  Depression screen [facility] 2/9 05/14/2018 12/20/2015  Decreased Interest 3 0  Down, Depressed, Hopeless 2 0  PHQ - 2 Score 5 0  Altered sleeping 3 -  Tired, decreased energy 3 -  Change in appetite 3 -  Feeling bad or failure about yourself  0 -  Trouble concentrating 1 -  Moving slowly or fidgety/restless 0 -  Suicidal thoughts 0 -  PHQ-9 Score 15 -  Difficult doing work/chores Not difficult at all -   ASSESSMENT AND PLAN:   ICD-10-CM   1. Type 2 diabetes mellitus without complication, without long-term current use of insulin (HCC)  E11.9 Dulaglutide (TRULICITY) 5.63 SL/3.7DS SOPN  2. Gastroesophageal reflux disease, esophagitis presence not specified  K21.9 pantoprazole (PROTONIX) 20 MG tablet  3. Other hyperlipidemia  E78.49 atorvastatin (LIPITOR) 10 MG tablet  4. At risk for heart disease  Z91.89   5. Other depression  F32.89 buPROPion (WELLBUTRIN SR) 150 MG 12 hr tablet   with emotional eating  6. Class 3 severe obesity with serious comorbidity and body mass index (BMI) of 45.0 to 49.9 in adult, unspecified obesity type ([place])  E66.01    Z68.42   PLAN:  Diabetes Mellitus Stacey Clark has been given extensive diabetes education by myself today including ideal fasting and post-prandial blood glucose readings, individual ideal HgA1c goals  and hypoglycemia  prevention. We discussed the importance of good blood sugar control to decrease the likelihood of diabetic complications such as nephropathy, neuropathy, limb loss, blindness, coronary artery disease, and death. We discussed the importance of intensive lifestyle modification including diet, exercise and weight loss as the first line treatment for diabetes. Stacey Clark was given a refill on her Trulicity 2.83 mg weekly #4 with 0 refills. She agrees to follow-up with our clinic in 3 weeks.  Gastroesophageal Reflux Disease Stacey Clark was given a refill on her Protonix 20 mg daily #30 with 0 refills. She agrees to follow-up with our clinic in 3 weeks.  Hyperlipidemia Stacey Clark was informed of the American Heart Association Guidelines emphasizing intensive lifestyle modifications as the first line treatment for hyperlipidemia. We discussed many lifestyle modifications today in depth, and Stacey Clark will continue to work on decreasing saturated fats such as fatty red meat, butter and many fried foods. She will also increase vegetables and lean protein in her diet and continue to work on exercise and weight loss efforts. Stacey Clark was given a refill on her Lipitor 10 mg QD #30 with 0 refills and agrees to follow-up with our clinic in 3 weeks.  Cardiovascular risk counselling Stacey Clark was given extended (15 minutes) coronary artery disease prevention counseling today. She is 48 y.o. female and has risk factors for heart disease including obesity. We discussed intensive lifestyle modifications today with an emphasis on specific weight loss instructions and strategies. Pt was also informed of the importance of increasing exercise and decreasing saturated fats to help prevent heart disease.  Depression with Emotional Eating Behaviors We discussed behavior modification techniques today to help Stacey Clark deal with her emotional eating and depression.Stacey Clark was given a refill on her bupropion 150 mg SR 150 mg BID #60 with 0 refills and agrees to  follow-up with our clinic in 3 weeks.  Obesity Stacey Clark is currently in the action stage of change. As such, her goal is to continue with weight loss efforts. She has agreed to follow the Category 3 plan. Fermina has been instructed to continue her current exercise regimen for weight loss and overall health benefits. We discussed the following Behavioral Modification Strategies today: decreasing simple carbohydrates and planning for success.  Frederica has agreed to follow-up with our clinic in 3 weeks. She was informed of the importance of frequent follow-up visits to maximize her success with intensive lifestyle modifications for her multiple health conditions.  ALLERGIES: Allergies  Allergen Reactions  . Cephalexin Swelling  . Hydrocodone Nausea And Vomiting  . Rifampin Nausea And Vomiting, Rash and Other (See Comments)    Nausea, Vomiting  . Sulfa Antibiotics Hives  . Augmentin [Amoxicillin-Pot Clavulanate] Nausea And Vomiting  . Metformin And Related Other (See Comments)    GI upset  . Penicillins Swelling    MEDICATIONS: Current Outpatient Medications on File Prior to Visit  Medication Sig Dispense Refill  . ALPRAZolam (XANAX) 0.5 MG tablet Take 0.5 mg by  mouth every morning.     . cholecalciferol (VITAMIN D3) 25 MCG (1000 UT) tablet Take 2,000 Units by mouth daily.    Marland Kitchen escitalopram (LEXAPRO) 5 MG tablet Take 5 mg by mouth daily.     No current facility-administered medications on file prior to visit.     PAST MEDICAL HISTORY: Past Medical History:  Diagnosis Date  . Anxiety   . Asthma   . Back pain   . Depression   . Diabetes mellitus without complication ([place])   . GERD (gastroesophageal reflux disease)   . HLD (hyperlipidemia)   . IBS (irritable bowel syndrome)   . Joint pain   . Obesity   . Sleep apnea     PAST SURGICAL HISTORY: Past Surgical History:  Procedure Laterality Date  . CESAREAN SECTION    . TONSILLECTOMY      SOCIAL HISTORY: Social History    Tobacco Use  . Smoking status: Former Smoker    Quit date: 12/30/2006    Years since quitting: 12.5  . Smokeless tobacco: Never Used  Substance Use Topics  . Alcohol use: No    Alcohol/week: 0.0 standard drinks  . Drug use: No    FAMILY HISTORY: Family History  Problem Relation Age of Onset  . Hypertension Mother   . Cancer Mother   . Depression Mother   . Anxiety disorder Mother   . Sleep apnea Mother   . Obesity Mother   . Hyperlipidemia Father   . Cancer Father   . Depression Father   . Anxiety disorder Father   . Bipolar disorder Father   . Alcoholism Father   . Obesity Father   . Thyroid disease Neg Hx    ROS: Review of Systems  Respiratory: Negative for shortness of breath.   Cardiovascular: Negative for chest pain.  Gastrointestinal:       Positive for GERD.  Endo/Heme/Allergies:       Negative for hypoglycemia.  Psychiatric/Behavioral: Positive for depression (emotional eating). Negative for suicidal ideas.       Negative for homicidal ideas.   PHYSICAL EXAM: Blood pressure 113/75, pulse 61, temperature 97.9 F (36.6 C), height 5\' 1"  (1.549 m), weight 261 lb (118.4 kg), SpO2 97 %. Body mass index is 49.32 kg/m. Physical Exam Vitals signs reviewed.  Constitutional:      Appearance: Normal appearance. She is obese.  Cardiovascular:     Rate and Rhythm: Normal rate.     Pulses: Normal pulses.  Pulmonary:     Effort: Pulmonary effort is normal.     Breath sounds: Normal breath sounds.  Musculoskeletal: Normal range of motion.  Skin:    General: Skin is warm and dry.  Neurological:     Mental Status: She is alert and oriented to person, place, and time.  Psychiatric:        Behavior: Behavior normal.   RECENT LABS AND TESTS: BMET    Component Value Date/Time   NA 139 04/08/2019 0923   K 4.4 04/08/2019 0923   CL 100 04/08/2019 0923   CO2 26 04/08/2019 0923   GLUCOSE 97 04/08/2019 0923   BUN 10 04/08/2019 0923   CREATININE 0.64 04/08/2019 0923    CALCIUM 8.9 04/08/2019 0923   GFRNONAA 105 04/08/2019 0923   GFRAA 121 04/08/2019 0923   Lab Results  Component Value Date   HGBA1C 6.4 (H) 04/08/2019   HGBA1C 6.2 (H) 12/08/2018   HGBA1C 6.3 (H) 09/02/2018   HGBA1C 6.8 (H) 05/14/2018   Lab  Results  Component Value Date   INSULIN 21.1 04/08/2019   INSULIN 25.8 (H) 12/08/2018   INSULIN 20.0 09/02/2018   INSULIN 41.6 (H) 05/14/2018   CBC    Component Value Date/Time   WBC 10.0 05/14/2018 0919   WBC 13.3 (H) 01/15/2012 1237   RBC 4.69 05/14/2018 0919   RBC 4.90 01/15/2012 1237   HGB 13.2 05/14/2018 0919   HCT 39.8 05/14/2018 0919   PLT 326 01/15/2012 1237   MCV 85 05/14/2018 0919   MCH 28.1 05/14/2018 0919   MCH 29.6 01/15/2012 1237   MCHC 33.2 05/14/2018 0919   MCHC 34.0 01/15/2012 1237   RDW 14.4 05/14/2018 0919   LYMPHSABS 2.3 05/14/2018 0919   MONOABS 0.7 01/15/2012 1237   EOSABS 0.2 05/14/2018 0919   BASOSABS 0.1 05/14/2018 0919   Iron/TIBC/Ferritin/ %Sat No results found for: IRON, TIBC, FERRITIN, IRONPCTSAT Lipid Panel     Component Value Date/Time   CHOL 152 04/08/2019 0923   TRIG 92 04/08/2019 0923   HDL 52 04/08/2019 0923   LDLCALC 82 04/08/2019 0923   Hepatic Function Panel     Component Value Date/Time   PROT 6.3 04/08/2019 0923   ALBUMIN 3.9 04/08/2019 0923   AST 12 04/08/2019 0923   ALT 12 04/08/2019 0923   ALKPHOS 84 04/08/2019 0923   BILITOT 0.3 04/08/2019 0923      Component Value Date/Time   TSH 2.360 05/14/2018 0919   Results for SUBRENA, DEVEREUX (MRN [id number]) as of 06/29/2019 07:43  Ref. Range 04/08/2019 09:23  Vitamin D, 25-Hydroxy Latest Ref Range: 30.0 - 100.0 ng/mL 51.5   OBESITY BEHAVIORAL INTERVENTION VISIT  Today's visit was #22  Starting weight: 277 lbs Starting date: 05/14/2018 Today's weight: 261 lbs  Today's date: 06/28/2019 Total lbs lost to date: 16    06/28/2019  Height 5\' 1"  (1.549 m)  Weight 261 lb (118.4 kg)  BMI (Calculated) 49.34  BLOOD PRESSURE -  SYSTOLIC 654  BLOOD PRESSURE - DIASTOLIC 75   Body Fat % 65.0 %  Total Body Water (lbs) 92.61 lbs   ASK: We discussed the diagnosis of obesity with Julieta Gutting today and Trystan agreed to give [place] permission to discuss obesity behavioral modification therapy today.  ASSESS: Albertha has the diagnosis of obesity and her BMI today is 49.3. Tanaisha is in the action stage of change.   ADVISE: Jaqulyn was educated on the multiple health risks of obesity as well as the benefit of weight loss to improve her health. She was advised of the need for long term treatment and the importance of lifestyle modifications to improve her current health and to decrease her risk of future health problems.  AGREE: Multiple dietary modification options and treatment options were discussed and  Krithi agreed to follow the recommendations documented in the above note.  ARRANGE: Arron was educated on the importance of frequent visits to treat obesity as outlined per CMS and USPSTF guidelines and agreed to schedule her next follow up appointment today.  IMichaelene Song, am acting as Location manager for [employer], FNP  I have reviewed the above documentation for accuracy and completeness, and I agree with the above.  -  , FNP-C.

## 2019-07-15 MED FILL — BUPROPION HCL SR 150 MG TAB: 150 | 30 days supply | Qty: 60 | Fill #0

## 2019-07-15 MED FILL — ATORVASTATIN 10 MG TABLET: 10 | 30 days supply | Qty: 30 | Fill #0

## 2019-07-17 ENCOUNTER — Encounter (INDEPENDENT_AMBULATORY_CARE_PROVIDER_SITE_OTHER): Payer: Self-pay | Admitting: Family Medicine

## 2019-07-20 ENCOUNTER — Ambulatory Visit (INDEPENDENT_AMBULATORY_CARE_PROVIDER_SITE_OTHER): Payer: 59 | Admitting: Family Medicine

## 2019-07-20 MED FILL — TRULICITY 0.75 MG/0.5 ML PE: 0.75 | 28 days supply | Qty: 2 | Fill #0

## 2019-07-26 MED FILL — PANTOPRAZOLE SOD DR 20 MG T: 20 | 30 days supply | Qty: 30 | Fill #0

## 2019-08-02 MED FILL — ALPRAZolam 0.5 MG TABS: 0.5 | 90 days supply | Qty: 180 | Fill #0

## 2019-08-02 MED FILL — TRULICITY 1.5 MG/0.5 ML PEN: 1.5 | 28 days supply | Qty: 2 | Fill #0

## 2019-08-02 MED FILL — ESCITALOPRAM 20 MG TABLET: 20 | 90 days supply | Qty: 90 | Fill #0

## 2019-08-20 MED FILL — BUPROPION HCL SR 150 MG TAB: 150 | 90 days supply | Qty: 180 | Fill #0

## 2019-09-01 MED FILL — ATORVASTATIN 10 MG TABLET: 10 | 90 days supply | Qty: 90 | Fill #0

## 2019-09-01 MED FILL — PANTOPRAZOLE SOD DR 20 MG T: 20 | 90 days supply | Qty: 90 | Fill #0

## 2019-09-23 MED FILL — TRULICITY 0.75 MG/0.5 ML PE: 0.75 | 84 days supply | Qty: 6 | Fill #0

## 2019-09-30 DIAGNOSIS — Z23 Encounter for immunization: Secondary | ICD-10-CM | POA: Diagnosis not present

## 2019-10-13 MED FILL — levoFLOXacin 750 MG TABS: 750 | 10 days supply | Qty: 10 | Fill #0

## 2019-10-15 DIAGNOSIS — R0989 Other specified symptoms and signs involving the circulatory and respiratory systems: Secondary | ICD-10-CM | POA: Diagnosis not present

## 2019-10-15 DIAGNOSIS — Z20828 Contact with and (suspected) exposure to other viral communicable diseases: Secondary | ICD-10-CM | POA: Diagnosis not present

## 2019-10-15 DIAGNOSIS — R05 Cough: Secondary | ICD-10-CM | POA: Diagnosis not present

## 2019-10-15 DIAGNOSIS — J189 Pneumonia, unspecified organism: Secondary | ICD-10-CM | POA: Diagnosis not present

## 2019-11-02 MED FILL — ESCITALOPRAM 20 MG TABLET: 20 | 90 days supply | Qty: 90 | Fill #1

## 2019-11-04 DIAGNOSIS — Z0000 Encounter for general adult medical examination without abnormal findings: Secondary | ICD-10-CM | POA: Diagnosis not present

## 2019-11-04 DIAGNOSIS — E1165 Type 2 diabetes mellitus with hyperglycemia: Secondary | ICD-10-CM | POA: Diagnosis not present

## 2019-11-04 DIAGNOSIS — Z23 Encounter for immunization: Secondary | ICD-10-CM | POA: Diagnosis not present

## 2019-11-04 DIAGNOSIS — E119 Type 2 diabetes mellitus without complications: Secondary | ICD-10-CM | POA: Diagnosis not present

## 2019-11-04 DIAGNOSIS — Z1211 Encounter for screening for malignant neoplasm of colon: Secondary | ICD-10-CM | POA: Diagnosis not present

## 2019-11-04 DIAGNOSIS — I10 Essential (primary) hypertension: Secondary | ICD-10-CM | POA: Diagnosis not present

## 2019-11-04 DIAGNOSIS — E559 Vitamin D deficiency, unspecified: Secondary | ICD-10-CM | POA: Diagnosis not present

## 2019-11-04 MED FILL — ALPRAZolam 0.5 MG TABS: 0.5 | 90 days supply | Qty: 180 | Fill #0

## 2019-11-04 MED FILL — MELOXICAM 15 MG TABLET: 15 | 30 days supply | Qty: 30 | Fill #0

## 2019-11-06 DIAGNOSIS — W19XXXA Unspecified fall, initial encounter: Secondary | ICD-10-CM | POA: Diagnosis not present

## 2019-11-06 DIAGNOSIS — S76911A Strain of unspecified muscles, fascia and tendons at thigh level, right thigh, initial encounter: Secondary | ICD-10-CM | POA: Diagnosis not present

## 2019-11-06 DIAGNOSIS — G5701 Lesion of sciatic nerve, right lower limb: Secondary | ICD-10-CM | POA: Diagnosis not present

## 2019-11-08 MED FILL — VIT D2 1.25 MG (50,000 UNIT: 1.25 MG | 84 days supply | Qty: 12 | Fill #0

## 2019-11-24 MED FILL — BUPROPION HCL SR 150 MG TAB: 150 | 90 days supply | Qty: 180 | Fill #1

## 2019-12-02 MED FILL — ATORVASTATIN 20 MG TABLET: 20 | 90 days supply | Qty: 90 | Fill #0

## 2019-12-02 MED FILL — PANTOPRAZOLE SOD DR 20 MG T: 20 | 90 days supply | Qty: 90 | Fill #1

## 2019-12-10 DIAGNOSIS — G43009 Migraine without aura, not intractable, without status migrainosus: Secondary | ICD-10-CM | POA: Diagnosis not present

## 2019-12-10 MED FILL — SUMAtriptan SUCCINATE 100 M: 100 | 90 days supply | Qty: 27 | Fill #0

## 2019-12-13 MED FILL — TRULICITY 0.75 MG/0.5 ML PE: 0.75 | 84 days supply | Qty: 6 | Fill #1

## 2019-12-14 DIAGNOSIS — U071 COVID-19: Secondary | ICD-10-CM | POA: Diagnosis not present

## 2019-12-14 DIAGNOSIS — R5383 Other fatigue: Secondary | ICD-10-CM | POA: Diagnosis not present

## 2019-12-14 DIAGNOSIS — M2550 Pain in unspecified joint: Secondary | ICD-10-CM | POA: Diagnosis not present

## 2019-12-14 DIAGNOSIS — R5381 Other malaise: Secondary | ICD-10-CM | POA: Diagnosis not present

## 2019-12-14 DIAGNOSIS — G44209 Tension-type headache, unspecified, not intractable: Secondary | ICD-10-CM | POA: Diagnosis not present

## 2019-12-14 MED FILL — ONDANSETRON HCL 4 MG TABLET: 4 | 7 days supply | Qty: 20 | Fill #0

## 2020-01-20 MED FILL — VIT D2 1.25 MG (50,000 UNIT: 1.25 MG | 84 days supply | Qty: 12 | Fill #1

## 2020-01-21 DIAGNOSIS — E119 Type 2 diabetes mellitus without complications: Secondary | ICD-10-CM | POA: Diagnosis not present

## 2020-01-31 MED FILL — ALPRAZolam 0.5 MG TABS: 0.5 | 90 days supply | Qty: 180 | Fill #1

## 2020-01-31 MED FILL — ESCITALOPRAM 20 MG TABLET: 20 | 90 days supply | Qty: 90 | Fill #2

## 2020-02-02 DIAGNOSIS — R202 Paresthesia of skin: Secondary | ICD-10-CM | POA: Diagnosis not present

## 2020-02-02 DIAGNOSIS — R200 Anesthesia of skin: Secondary | ICD-10-CM | POA: Diagnosis not present

## 2020-02-08 DIAGNOSIS — K219 Gastro-esophageal reflux disease without esophagitis: Secondary | ICD-10-CM | POA: Diagnosis not present

## 2020-02-08 DIAGNOSIS — Z1211 Encounter for screening for malignant neoplasm of colon: Secondary | ICD-10-CM | POA: Diagnosis not present

## 2020-02-08 DIAGNOSIS — Z86010 Personal history of colonic polyps: Secondary | ICD-10-CM | POA: Diagnosis not present

## 2020-02-14 DIAGNOSIS — H16223 Keratoconjunctivitis sicca, not specified as Sjogren's, bilateral: Secondary | ICD-10-CM | POA: Diagnosis not present

## 2020-02-14 MED FILL — LOTEMAX SM 0.38 % GEL: 0.38 | 12 days supply | Qty: 5 | Fill #0

## 2020-02-14 MED FILL — OLOPATADINE HCL 0.2 % SOLN: 0.2 | 30 days supply | Qty: 3 | Fill #0

## 2020-02-15 DIAGNOSIS — R2231 Localized swelling, mass and lump, right upper limb: Secondary | ICD-10-CM | POA: Diagnosis not present

## 2020-02-15 DIAGNOSIS — M6281 Muscle weakness (generalized): Secondary | ICD-10-CM | POA: Diagnosis not present

## 2020-02-15 DIAGNOSIS — R202 Paresthesia of skin: Secondary | ICD-10-CM | POA: Diagnosis not present

## 2020-02-15 MED FILL — GABAPENTIN 100 MG CAPSULE: 100 | 30 days supply | Qty: 180 | Fill #0

## 2020-02-21 MED FILL — BUPROPION HCL SR 150 MG TAB: 150 | 90 days supply | Qty: 180 | Fill #2

## 2020-02-28 ENCOUNTER — Other Ambulatory Visit: Payer: Self-pay

## 2020-02-28 DIAGNOSIS — R202 Paresthesia of skin: Secondary | ICD-10-CM

## 2020-02-29 ENCOUNTER — Ambulatory Visit (INDEPENDENT_AMBULATORY_CARE_PROVIDER_SITE_OTHER): Payer: 59 | Admitting: Neurology

## 2020-02-29 ENCOUNTER — Other Ambulatory Visit: Payer: Self-pay

## 2020-02-29 DIAGNOSIS — R202 Paresthesia of skin: Secondary | ICD-10-CM | POA: Diagnosis not present

## 2020-02-29 DIAGNOSIS — G5601 Carpal tunnel syndrome, right upper limb: Secondary | ICD-10-CM

## 2020-02-29 NOTE — Procedures (Signed)
[facility]  [address]  [place], [place] [address] Tel: [phone] Fax:  [phone] Test Date:  02/29/2020  Patient: Stacey Clark DOB: 1971-11-04 Physician: Narda Amber, DO  Sex: Female Height: 5\' 1"  Ref Phys: Virginia Crews  ID#: [id number] Temp: 34.0C Technician:    Patient Complaints: This is a 49 year old female referred for evaluation of left hand numbness and tingling.  NCV & EMG Findings: Extensive electrodiagnostic testing of the right upper extremity shows:  1. Right median sensory response is absent.  Right ulnar sensory responses within normal limits. 2. Right median motor response shows prolonged latency (5.8 ms).  Right ulnar motor responses within normal limits.   3. Chronic motor axonal loss changes are seen affecting the right abductor pollicis brevis muscle, without accompanied active denervation.    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     ___________________________ Narda Amber, DO    Nerve Conduction Studies Anti Sensory Summary Table   Stim Site NR Peak (ms) Norm Peak (ms) P-T Amp (V) Norm P-T Amp  Right Median Anti Sensory (2nd Digit)  34C  Wrist NR  <3.6  >15  Right Ulnar Anti Sensory (5th Digit)  34C  Wrist    2.4 <3.1 27.8 >10   Motor Summary Table   Stim Site NR Onset (ms) Norm Onset (ms) O-P Amp (mV) Norm O-P Amp Site1 Site2 Delta-0 (ms) Dist (cm) Vel (m/s) Norm Vel (m/s)  Right Median Motor (Abd Poll Brev)  34C  Wrist    5.8 <4.0 6.7 >6 Elbow Wrist 4.7 25.0 53 >50  Elbow    10.5  6.4         Right Ulnar Motor (Abd Dig Minimi)  34C  Wrist    1.8 <3.1 9.7 >7 B Elbow Wrist 3.4 21.0 62 >50  B Elbow    5.2  9.4  A Elbow B Elbow 1.8 10.0 56 >50  A Elbow    7.0  9.3          EMG   Side Muscle Ins Act Fibs Psw Fasc Number Recrt Dur Dur. Amp Amp. Poly Poly. Comment  Right 1stDorInt Nml Nml Nml Nml Nml Nml Nml  Nml Nml Nml Nml Nml N/A  Right Abd Poll Brev Nml Nml Nml Nml 1- Rapid Some 1+ Some 1+ Few 1+ N/A  Right Ext Indicis Nml Nml Nml Nml Nml Nml Nml Nml Nml Nml Nml Nml N/A  Right PronatorTeres Nml Nml Nml Nml Nml Nml Nml Nml Nml Nml Nml Nml N/A  Right Biceps Nml Nml Nml Nml Nml Nml Nml Nml Nml Nml Nml Nml N/A  Right Triceps Nml Nml Nml Nml Nml Nml Nml Nml Nml Nml Nml Nml N/A  Right Deltoid Nml Nml Nml Nml Nml Nml Nml Nml Nml Nml Nml Nml N/A      Waveforms:

## 2020-03-03 MED FILL — levoFLOXacin 750 MG TABS: 750 | 10 days supply | Qty: 10 | Fill #0

## 2020-03-07 DIAGNOSIS — H04123 Dry eye syndrome of bilateral lacrimal glands: Secondary | ICD-10-CM | POA: Diagnosis not present

## 2020-03-09 ENCOUNTER — Other Ambulatory Visit: Payer: Self-pay

## 2020-03-09 ENCOUNTER — Ambulatory Visit: Payer: 59 | Admitting: Orthopedic Surgery

## 2020-03-09 ENCOUNTER — Encounter: Payer: Self-pay | Admitting: Orthopedic Surgery

## 2020-03-09 DIAGNOSIS — G5601 Carpal tunnel syndrome, right upper limb: Secondary | ICD-10-CM | POA: Diagnosis not present

## 2020-03-09 MED FILL — PANTOPRAZOLE SOD DR 20 MG T: 20 | 90 days supply | Qty: 90 | Fill #2

## 2020-03-09 MED FILL — TRULICITY 0.75 MG/0.5 ML PE: 0.75 | 28 days supply | Qty: 2 | Fill #0

## 2020-03-09 MED FILL — XIIDRA 5 % SOLN: 5 | 90 days supply | Qty: 180 | Fill #0

## 2020-03-09 NOTE — Progress Notes (Signed)
Office Visit Note   Patient: Stacey Clark           Date of Birth: 11-Jun-1971           MRN: [id number] Visit Date: 03/09/2020 Requested by: Myrlene Broker, MD [place],  [place] [id number] PCP: Myrlene Broker, MD  Subjective: Chief Complaint  Patient presents with  . Right Wrist - Pain    HPI: Stacey Clark is a patient with right hand numbness and tingling.  She has had a nerve study which shows severe carpal tunnel syndrome.  She is had symptoms now for 3 months.  She does describe essentially constant tingling in her fingers along with some loss of dexterity.  Does not report much in the way of weakness.  She is right-hand dominant.  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  She has wrist splints at home.  She has been dropping things.              ROS: All systems reviewed are negative as they relate to the chief complaint within the history of present illness.  Patient denies  fevers or chills.   Assessment & Plan: Visit Diagnoses:  1. Carpal tunnel syndrome, right upper limb     Plan: Impression is severe right carpal tunnel syndrome.  I think this is a surgical problem.  Not a great time for Stacey Clark at this time for surgical release.  Discussed the rehab and the expected time to recovery with Stacey Clark today.  Risk and benefits of surgery also discussed including not limited to infection nerve vessel damage incomplete pain relief and incomplete restoration of normal sensation.  Particularly in the severe cases I have seen that it can take a while for the sensation to return.  Patient understands risk benefits.  She is requesting an injection today which is performed under ultrasound guidance.  I did caution her that with this type of severe carpal tunnel syndrome it is better to consider surgical treatment sooner rather than later before permanent damage occurs.  I will see her back as needed.  Follow-Up Instructions: Return if symptoms worsen or fail to  improve.   Orders:  No orders of the defined types were placed in this encounter.  No orders of the defined types were placed in this encounter.     Procedures: Hand/UE Inj: R carpal tunnel for carpal tunnel syndrome on 03/09/2020 6:47 PM Details: 22 G needle, ultrasound-guided volar approach      Clinical Data: No additional findings.  Objective: Vital Signs: There were no vitals taken for this visit.  Physical Exam:   Constitutional: Patient appears well-developed HEENT:  Head: Normocephalic Eyes:EOM are normal Neck: Normal range of motion Cardiovascular: Normal rate Pulmonary/chest: Effort normal Neurologic: Patient is alert Skin: Skin is warm Psychiatric: Patient has normal mood and affect    Ortho Exam: Ortho exam demonstrates full cervical spine range of motion.  Patient has 5 out of 5 grip EPL FPL interosseous wrist flexion extension bicep triceps and deltoid strength.  She does have positive carpal tunnel compression testing on the right compared to the left.  Abductor pollicis brevis strength is symmetric bilaterally.  She does have paresthesias primarily palmar aspect of the hand all digits.  Radial pulses intact.  Specialty Comments:  No specialty comments available.  Imaging: No results found.   PMFS History: Patient Active Problem List   Diagnosis Date Noted  . Depression 06/21/2019  .  Gastroesophageal reflux disease 01/19/2019  . Vitamin D deficiency 01/04/2019  . Other fatigue 05/14/2018  . Shortness of breath on exertion 05/14/2018  . Type 2 diabetes mellitus without complication, without long-term current use of insulin ([place]) 05/14/2018  . Other hyperlipidemia 05/14/2018  . Obstructive sleep apnea syndrome 05/14/2018  . Obesity, unspecified 06/02/2014   Past Medical History:  Diagnosis Date  . Anxiety   . Asthma   . Back pain   . Depression   . Diabetes mellitus without complication ([place])   . GERD (gastroesophageal reflux disease)     . HLD (hyperlipidemia)   . IBS (irritable bowel syndrome)   . Joint pain   . Obesity   . Sleep apnea     Family History  Problem Relation Age of Onset  . Hypertension Mother   . Cancer Mother   . Depression Mother   . Anxiety disorder Mother   . Sleep apnea Mother   . Obesity Mother   . Hyperlipidemia Father   . Cancer Father   . Depression Father   . Anxiety disorder Father   . Bipolar disorder Father   . Alcoholism Father   . Obesity Father   . Thyroid disease Neg Hx     Past Surgical History:  Procedure Laterality Date  . CESAREAN SECTION    . TONSILLECTOMY     Social History   Occupational History  . Not on file  Tobacco Use  . Smoking status: Former Smoker    Quit date: 12/30/2006    Years since quitting: 13.2  . Smokeless tobacco: Never Used  Substance and Sexual Activity  . Alcohol use: No    Alcohol/week: 0.0 standard drinks  . Drug use: No  . Sexual activity: Not on file

## 2020-03-13 MED FILL — XIIDRA 5 % SOLN: 5 | 90 days supply | Qty: 180 | Fill #0

## 2020-03-21 DIAGNOSIS — G5601 Carpal tunnel syndrome, right upper limb: Secondary | ICD-10-CM | POA: Diagnosis not present

## 2020-03-21 DIAGNOSIS — M79641 Pain in right hand: Secondary | ICD-10-CM | POA: Diagnosis not present

## 2020-03-23 DIAGNOSIS — M79641 Pain in right hand: Secondary | ICD-10-CM | POA: Diagnosis not present

## 2020-03-23 DIAGNOSIS — G5601 Carpal tunnel syndrome, right upper limb: Secondary | ICD-10-CM | POA: Diagnosis not present

## 2020-03-24 MED FILL — rOPINIRole HCL 0.5 MG TABS: 0.5 | 90 days supply | Qty: 90 | Fill #0

## 2020-04-03 ENCOUNTER — Telehealth: Payer: Self-pay | Admitting: Orthopedic Surgery

## 2020-04-03 NOTE — Telephone Encounter (Signed)
Patient called stating that the injections did not work for her carpal tunnel.  She is wanting to know what the next step would be.  CB#[phone].  Thank you.

## 2020-04-03 NOTE — Telephone Encounter (Signed)
Please advise. Thanks.  

## 2020-04-04 NOTE — Telephone Encounter (Signed)
She states she is interested in surgery the end of May

## 2020-04-04 NOTE — Telephone Encounter (Signed)
Living with it or surgery.  Surgery would be open carpal tunnel release done as an outpatient under regional anesthetic

## 2020-04-05 NOTE — Telephone Encounter (Signed)
Lauren has surgery sheet.  Jackelyn Poling will call.

## 2020-04-06 DIAGNOSIS — R609 Edema, unspecified: Secondary | ICD-10-CM | POA: Diagnosis not present

## 2020-04-06 MED FILL — TRULICITY 0.75 MG/0.5 ML PE: 0.75 | 28 days supply | Qty: 2 | Fill #0

## 2020-04-10 DIAGNOSIS — M25511 Pain in right shoulder: Secondary | ICD-10-CM | POA: Diagnosis not present

## 2020-04-10 DIAGNOSIS — M79641 Pain in right hand: Secondary | ICD-10-CM | POA: Diagnosis not present

## 2020-04-10 DIAGNOSIS — M545 Low back pain: Secondary | ICD-10-CM | POA: Diagnosis not present

## 2020-04-10 DIAGNOSIS — M25512 Pain in left shoulder: Secondary | ICD-10-CM | POA: Diagnosis not present

## 2020-04-13 DIAGNOSIS — M25511 Pain in right shoulder: Secondary | ICD-10-CM | POA: Diagnosis not present

## 2020-04-13 DIAGNOSIS — M25512 Pain in left shoulder: Secondary | ICD-10-CM | POA: Diagnosis not present

## 2020-04-13 DIAGNOSIS — H5789 Other specified disorders of eye and adnexa: Secondary | ICD-10-CM | POA: Diagnosis not present

## 2020-04-13 DIAGNOSIS — M79641 Pain in right hand: Secondary | ICD-10-CM | POA: Diagnosis not present

## 2020-04-13 DIAGNOSIS — M545 Low back pain: Secondary | ICD-10-CM | POA: Diagnosis not present

## 2020-04-18 MED FILL — LOTEMAX SM 0.38 % GEL: 0.38 | 12 days supply | Qty: 5 | Fill #1

## 2020-04-18 MED FILL — VIT D2 1.25 MG (50,000 UNIT: 1.25 MG | 42 days supply | Qty: 6 | Fill #2

## 2020-04-18 MED FILL — OLOPATADINE HCL 0.2 % SOLN: 0.2 | 30 days supply | Qty: 3 | Fill #1

## 2020-05-01 IMAGING — RF DG SWALLOWING FUNCTION - NRPT [facility]
13 series · 13 of 24 positions shown · non-contrast
Comparison: none

[Series 1: run · 1 of 91 frames shown (1 of 13)]
[frame 1/91]
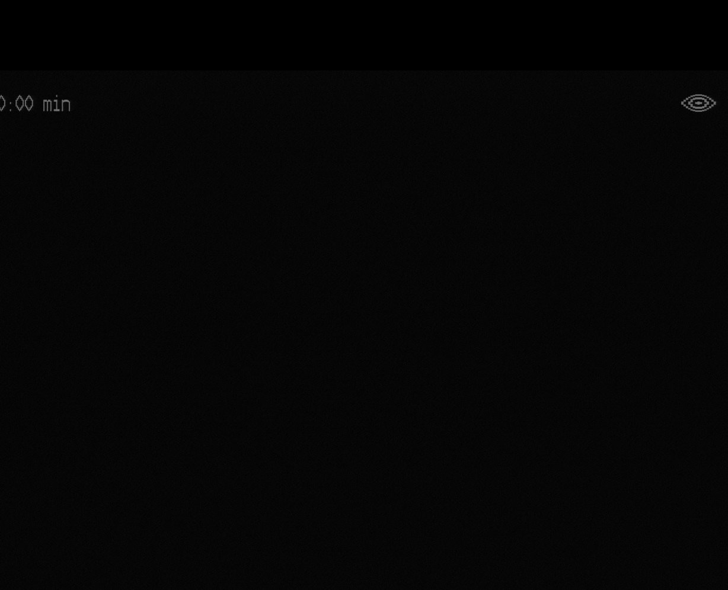

[Series 2: run · 1 of 140 frames shown (2 of 13)]
[frame 22/140]
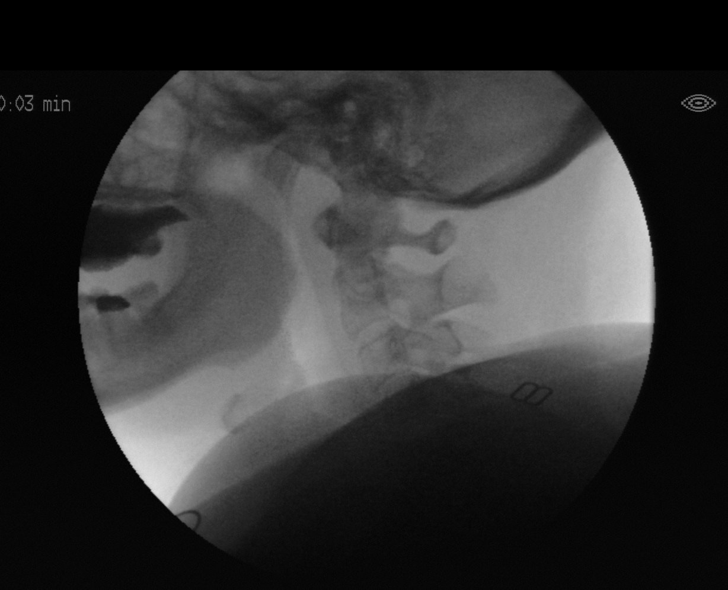

[Series 3: run · 1 of 127 frames shown (3 of 13)]
[frame 24/127]
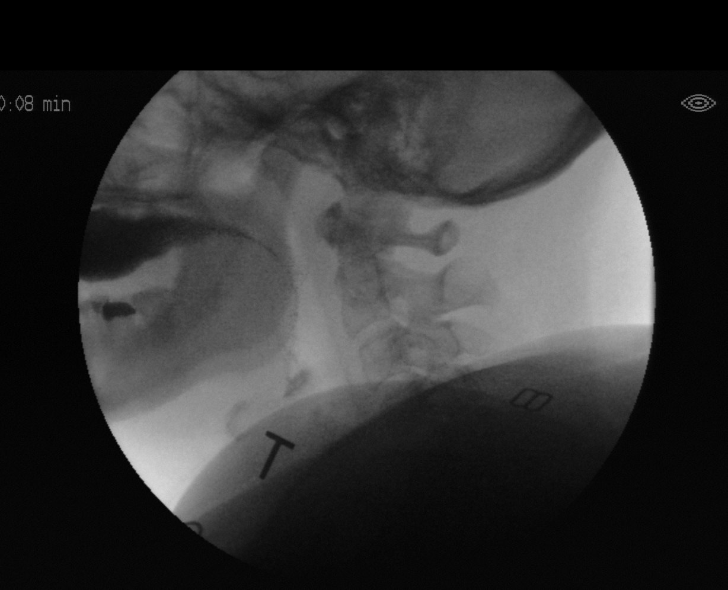

[Series 4: run · 1 of 220 frames shown (4 of 13)]
[frame 111/220]
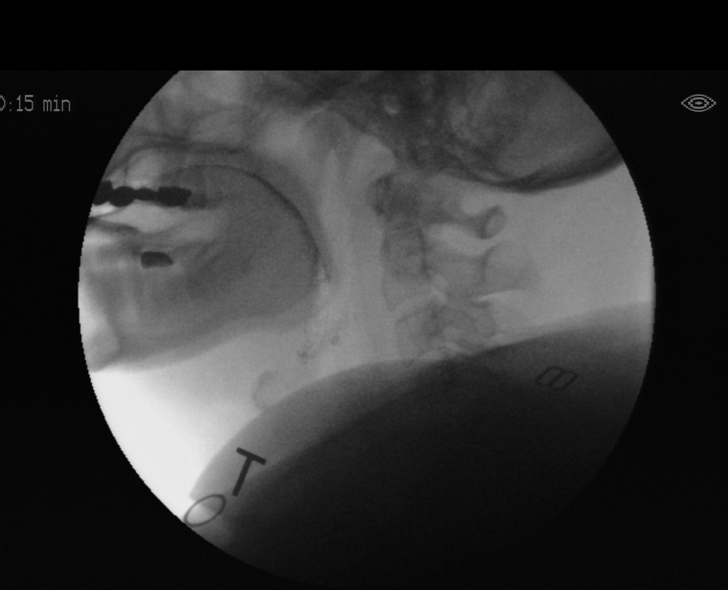

[Series 5: run · 1 of 434 frames shown (5 of 13)]
[frame 66/434]
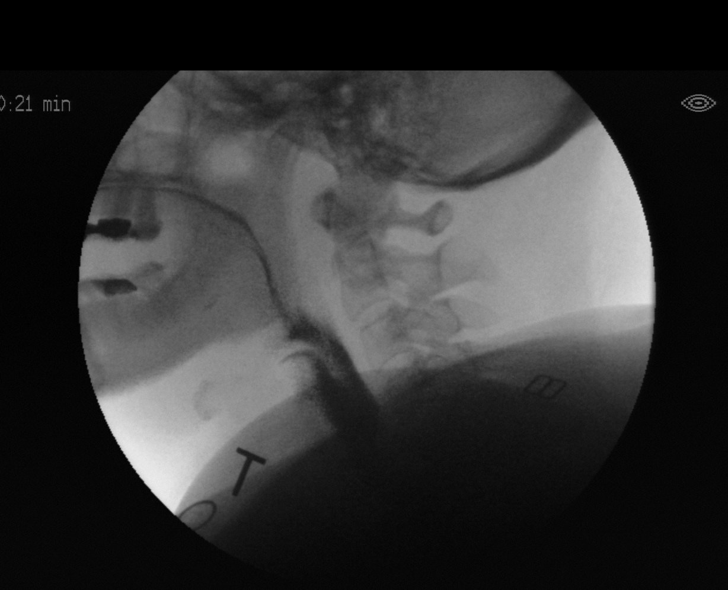

[Series 6: run · 1 of 95 frames shown (6 of 13)]
[frame 48/95]
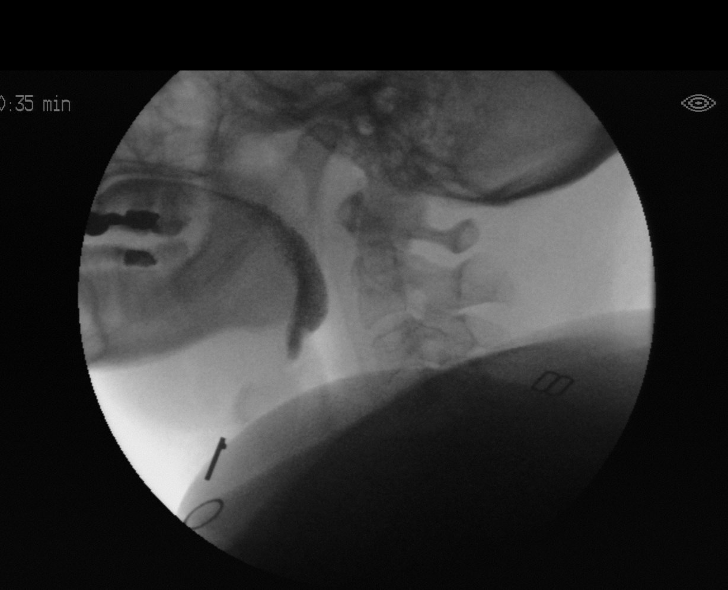

[Series 7: run · 1 of 94 frames shown (7 of 13)]
[frame 48/94]
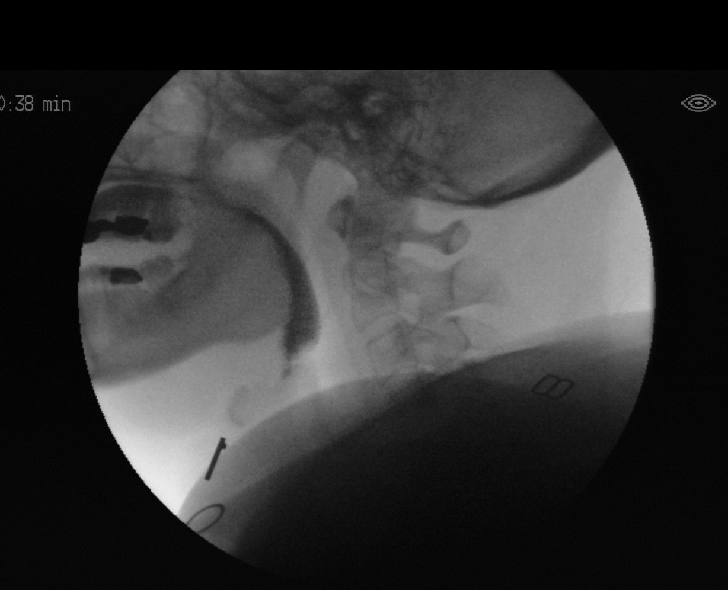

[Series 8: run · 1 of 140 frames shown (8 of 13)]
[frame 9/140]
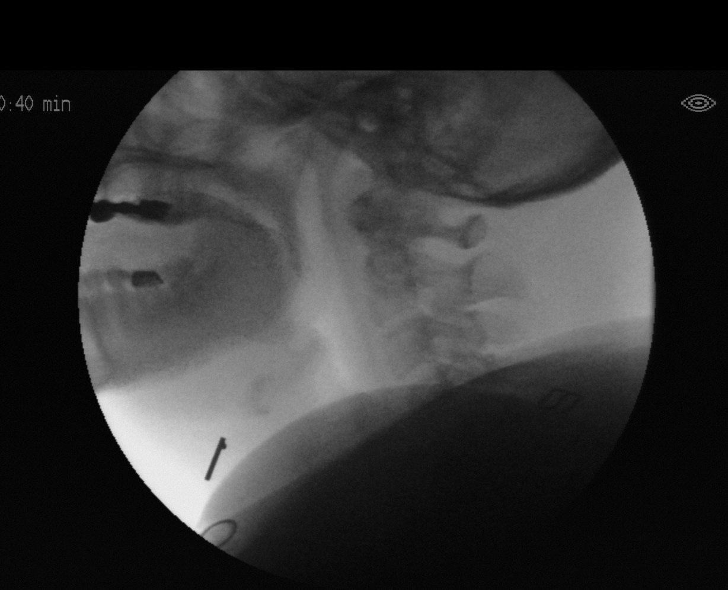

[Series 9: run · 1 of 556 frames shown (9 of 13)]
[frame 279/556]
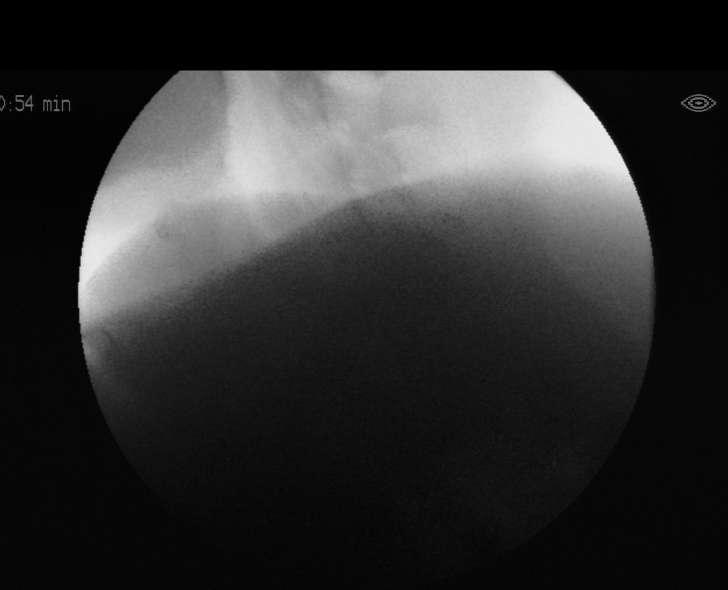

[Series 10: run · 1 of 152 frames shown (10 of 13)]
[frame 77/152]
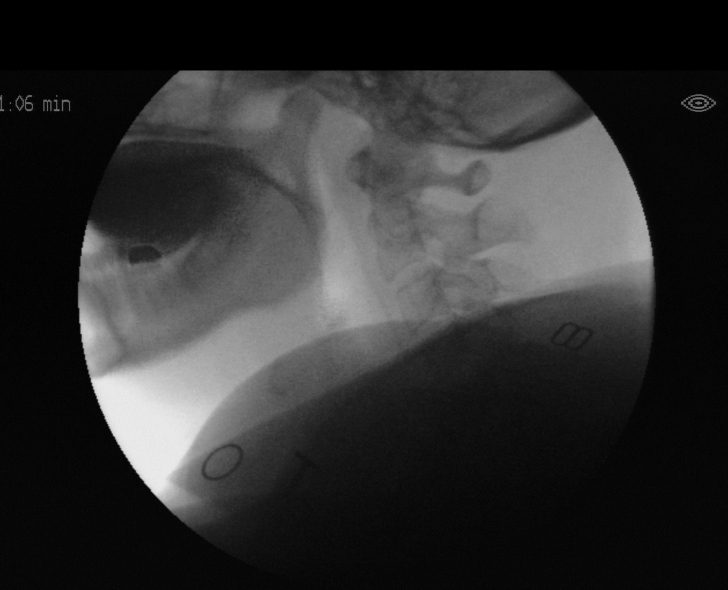

[Series 11: run · 1 of 112 frames shown (11 of 13)]
[frame 57/112]
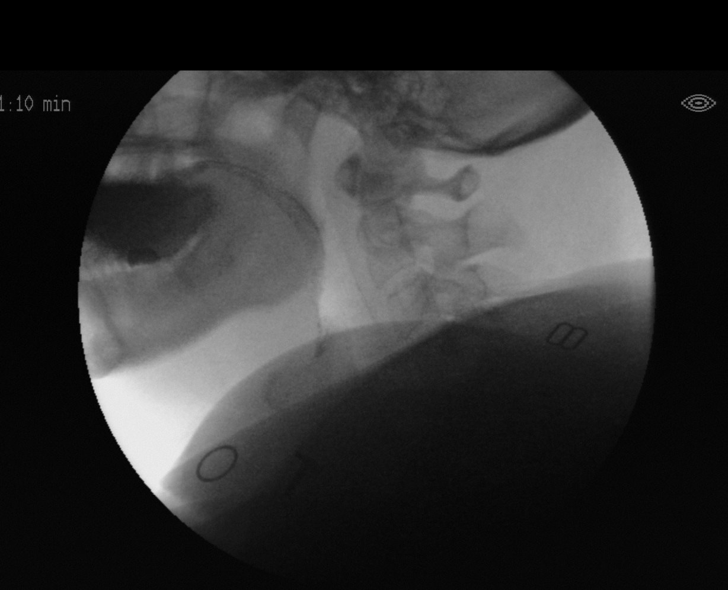

[Series 12: run · 1 of 256 frames shown (12 of 13)]
[frame 218/256]
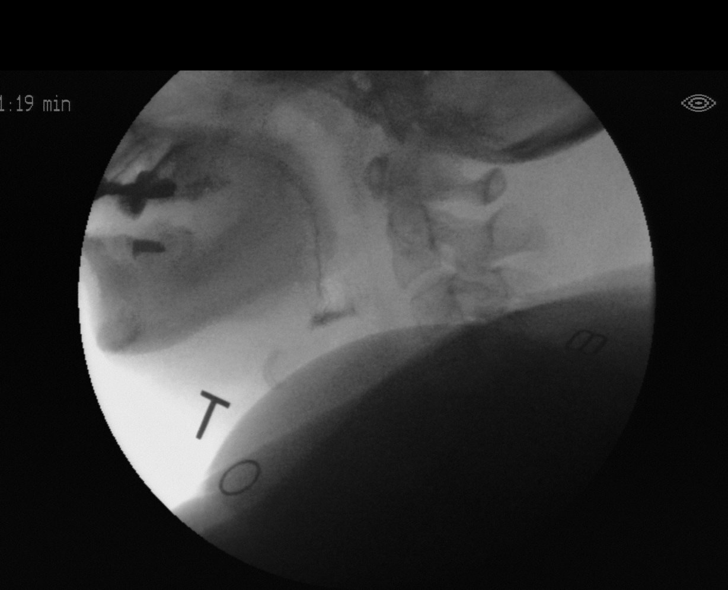

[Series 13: run · 1 of 171 frames shown (13 of 13)]
[frame 146/171]
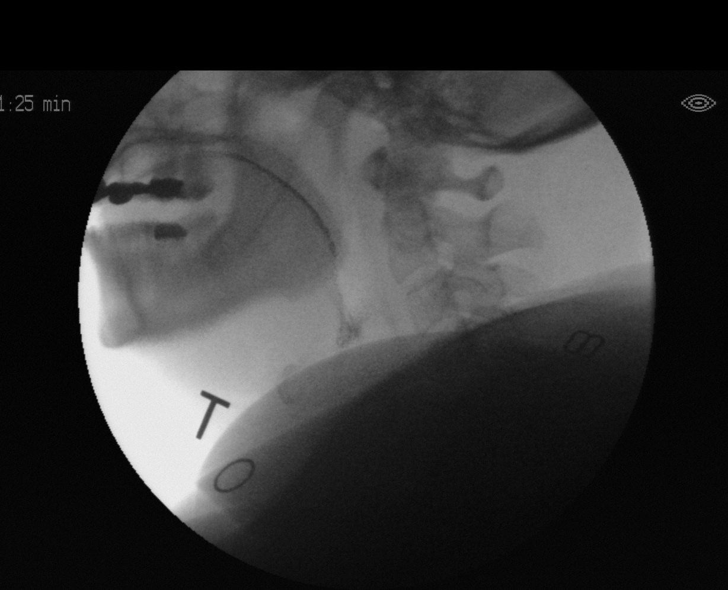

[13 of 24 positions shown; findings below may reference images not displayed]

FLUOROSCOPY FOR SWALLOWING FUNCTION STUDY:
Fluoroscopy was provided for swallowing function study, which was administered by a speech pathologist.  Final results and recommendations from this study are contained within the speech pathology report.

## 2020-05-02 ENCOUNTER — Other Ambulatory Visit (HOSPITAL_COMMUNITY): Payer: Self-pay | Admitting: Family Medicine

## 2020-05-02 MED FILL — ESCITALOPRAM 20 MG TABLET: 20 | 90 days supply | Qty: 90 | Fill #3

## 2020-05-02 MED FILL — TRULICITY 0.75 MG/0.5 ML PE: 0.75 | 28 days supply | Qty: 2 | Fill #0

## 2020-05-04 ENCOUNTER — Other Ambulatory Visit: Payer: Self-pay

## 2020-05-04 DIAGNOSIS — M25512 Pain in left shoulder: Secondary | ICD-10-CM | POA: Diagnosis not present

## 2020-05-04 DIAGNOSIS — M25511 Pain in right shoulder: Secondary | ICD-10-CM | POA: Diagnosis not present

## 2020-05-04 DIAGNOSIS — M545 Low back pain: Secondary | ICD-10-CM | POA: Diagnosis not present

## 2020-05-04 DIAGNOSIS — M79641 Pain in right hand: Secondary | ICD-10-CM | POA: Diagnosis not present

## 2020-05-05 MED FILL — ALPRAZolam 0.5 MG TABS: 0.5 | 90 days supply | Qty: 180 | Fill #0

## 2020-05-17 ENCOUNTER — Encounter (HOSPITAL_BASED_OUTPATIENT_CLINIC_OR_DEPARTMENT_OTHER): Payer: Self-pay | Admitting: Orthopedic Surgery

## 2020-05-17 ENCOUNTER — Other Ambulatory Visit: Payer: Self-pay

## 2020-05-18 ENCOUNTER — Encounter (HOSPITAL_BASED_OUTPATIENT_CLINIC_OR_DEPARTMENT_OTHER)
Admission: RE | Admit: 2020-05-18 | Discharge: 2020-05-18 | Disposition: A | Discharge status: Home / Self Care | Payer: 59 | Source: Ambulatory Visit | Attending: Orthopedic Surgery | Admitting: Orthopedic Surgery

## 2020-05-18 DIAGNOSIS — Z01818 Encounter for other preprocedural examination: Secondary | ICD-10-CM | POA: Diagnosis not present

## 2020-05-18 DIAGNOSIS — E119 Type 2 diabetes mellitus without complications: Secondary | ICD-10-CM | POA: Diagnosis not present

## 2020-05-18 LAB — BASIC METABOLIC PANEL
Anion gap: 9 (ref 5–15)
BUN: 10 mg/dL (ref 6–20)
CO2: 25 mmol/L (ref 22–32)
Calcium: 9.1 mg/dL (ref 8.9–10.3)
Chloride: 103 mmol/L (ref 98–111)
Creatinine, Ser: 0.6 mg/dL (ref 0.44–1.00)
GFR calc Af Amer: >60 mL/min (ref >60)
GFR calc non Af Amer: >60 mL/min (ref >60)
Glucose, Bld: 146 mg/dL — ABNORMAL HIGH (ref 70–99)
Potassium: 4.5 mmol/L (ref 3.5–5.1)
Sodium: 137 mmol/L (ref 135–145)

## 2020-05-18 LAB — CBC
HCT: 42.3 % (ref 36.0–46.0)
Hemoglobin: 13.6 g/dL (ref 12.0–15.0)
MCH: 28.1 pg (ref 26.0–34.0)
MCHC: 32.2 g/dL (ref 30.0–36.0)
MCV: 87.4 fL (ref 80.0–100.0)
Platelets: 269 10*3/uL (ref 150–400)
RBC: 4.84 MIL/uL (ref 3.87–5.11)
RDW: 12.9 % (ref 11.5–15.5)
WBC: 8.1 10*3/uL (ref 4.0–10.5)
nRBC: 0 % (ref 0.0–0.2)

## 2020-05-18 NOTE — Progress Notes (Signed)
Anesthesia consult per Dr. Joslin, will proceed with surgery as scheduled.  

## 2020-05-18 NOTE — Progress Notes (Signed)

## 2020-05-22 ENCOUNTER — Other Ambulatory Visit (HOSPITAL_COMMUNITY): Payer: Self-pay | Admitting: Family Medicine

## 2020-05-22 ENCOUNTER — Other Ambulatory Visit (HOSPITAL_COMMUNITY)
Admission: RE | Admit: 2020-05-22 | Discharge: 2020-05-22 | Disposition: A | Discharge status: Home / Self Care | Payer: 59 | Source: Ambulatory Visit | Attending: Orthopedic Surgery | Admitting: Orthopedic Surgery

## 2020-05-22 DIAGNOSIS — Z20822 Contact with and (suspected) exposure to covid-19: Secondary | ICD-10-CM | POA: Insufficient documentation

## 2020-05-22 DIAGNOSIS — Z01812 Encounter for preprocedural laboratory examination: Secondary | ICD-10-CM | POA: Insufficient documentation

## 2020-05-22 LAB — SARS CORONAVIRUS 2 (TAT 6-24 HRS): SARS Coronavirus 2: NEGATIVE

## 2020-05-22 MED FILL — BUPROPION HCL SR 150 MG TAB: 150 | 90 days supply | Qty: 180 | Fill #3

## 2020-05-24 MED FILL — TRULICITY 0.75 MG/0.5 ML PE: 0.75 | 28 days supply | Qty: 2 | Fill #0

## 2020-05-24 NOTE — Anesthesia Preprocedure Evaluation (Addendum)
Anesthesia Evaluation  Patient identified by MRN, date of birth, ID band Patient awake    Reviewed: Allergy & Precautions, NPO status , Patient's Chart, lab work & pertinent test results  History of Anesthesia Complications (+) PONV and history of anesthetic complications  Airway Mallampati: II       Dental no notable dental hx. (+) Teeth Intact   Pulmonary sleep apnea , former smoker,    Pulmonary exam normal breath sounds clear to auscultation       Cardiovascular Normal cardiovascular exam Rhythm:Regular Rate:Normal     Neuro/Psych PSYCHIATRIC DISORDERS Anxiety Depression negative neurological ROS     GI/Hepatic Neg liver ROS, GERD  ,  Endo/Other  diabetes, Type 2, Oral Hypoglycemic AgentsMorbid obesity  Renal/GU negative Renal ROS  negative genitourinary   Musculoskeletal negative musculoskeletal ROS (+)   Abdominal (+) + obese,   Peds  Hematology   Anesthesia Other Findings   Reproductive/Obstetrics                            Anesthesia Physical Anesthesia Plan  ASA: III  Anesthesia Plan: Regional and MAC   Post-op Pain Management:    Induction:   PONV Risk Score and Plan: Ondansetron, Midazolam and Scopolamine patch - Pre-op  Airway Management Planned: Nasal Cannula and Natural Airway  Additional Equipment: None  Intra-op Plan:   Post-operative Plan:   Informed Consent: I have reviewed the patients History and Physical, chart, labs and discussed the procedure including the risks, benefits and alternatives for the proposed anesthesia with the patient or authorized representative who has indicated his/her understanding and acceptance.       Plan Discussed with: CRNA  Anesthesia Plan Comments:        Anesthesia Quick Evaluation

## 2020-05-24 NOTE — H&P (Signed)
Stacey Clark is an 49 y.o. female.   Chief Complaint: Right hand pain HPI: Stacey Clark is a 49 year old female with right hand pain.  She is a [employer].  She is right-hand dominant.  She has had numbness and tingling in the fingers as well as some loss of dexterity for the past 3 to 4 months.  EMG nerve study shows severe carpal tunnel syndrome.  She is failed conservative management including splinting as well as activity modification.  Presents now for operative management after explanation risk benefits.  Past Medical History:  Diagnosis Date  . Anxiety   . Asthma   . Back pain   . Complication of anesthesia   . Depression   . Diabetes mellitus without complication ([place])   . GERD (gastroesophageal reflux disease)   . HLD (hyperlipidemia)   . IBS (irritable bowel syndrome)   . Joint pain   . Obesity   . PONV (postoperative nausea and vomiting)   . Sleep apnea     Past Surgical History:  Procedure Laterality Date  . CESAREAN SECTION    . ESOPHAGOPLASTY    . TONSILLECTOMY      Family History  Problem Relation Age of Onset  . Hypertension Mother   . Cancer Mother   . Depression Mother   . Anxiety disorder Mother   . Sleep apnea Mother   . Obesity Mother   . Hyperlipidemia Father   . Cancer Father   . Depression Father   . Anxiety disorder Father   . Bipolar disorder Father   . Alcoholism Father   . Obesity Father   . Thyroid disease Neg Hx    Social History:  reports that she quit smoking about 13 years ago. She has never used smokeless tobacco. She reports that she does not drink alcohol or use drugs.  Allergies:  Allergies  Allergen Reactions  . Cephalexin Swelling  . Hydrocodone Nausea And Vomiting  . Rifampin Nausea And Vomiting, Rash and Other (See Comments)    Nausea, Vomiting  . Sulfa Antibiotics Hives  . Augmentin [Amoxicillin-Pot Clavulanate] Nausea And Vomiting  . Metformin And Related Other (See Comments)    GI upset  . Penicillins Swelling    No  medications prior to admission.    No results found for this or any previous visit (from the past 48 hour(s)). No results found.  Review of Systems  Musculoskeletal: Positive for arthralgias.  All other systems reviewed and are negative.   Height 5' 2.25" (1.581 m), weight 128.2 kg, last menstrual period 04/30/2018. Physical Exam  Constitutional: She appears well-developed.  HENT:  Head: Normocephalic.  Eyes: Pupils are equal, round, and reactive to light.  Cardiovascular: Normal rate.  Respiratory: Effort normal.  Musculoskeletal:     Cervical back: Normal range of motion.  Skin: Skin is warm.  Psychiatric: She has a normal mood and affect.  Right hand demonstrates good grip strength.  Mild abductor pollicis brevis wasting.  EPL FPL interosseous strength intact.  Positive compression testing.  Radial pulses intact.  Assessment/Plan Impression is severe carpal tunnel syndrome.  Plan is open carpal tunnel release.  Risk benefits are discussed include not limited to infection nerve vessel damage delayed wound healing because of diabetes as well as delayed recovery of nerve function because of the severity of the carpal tunnel syndrome.  Patient understands risk benefits.  All questions answered.  Anderson Malta, MD 05/24/2020, 7:22 PM

## 2020-05-25 ENCOUNTER — Other Ambulatory Visit: Payer: Self-pay

## 2020-05-25 ENCOUNTER — Ambulatory Visit (HOSPITAL_BASED_OUTPATIENT_CLINIC_OR_DEPARTMENT_OTHER): Payer: 59 | Admitting: Anesthesiology

## 2020-05-25 ENCOUNTER — Ambulatory Visit (HOSPITAL_BASED_OUTPATIENT_CLINIC_OR_DEPARTMENT_OTHER)
Admission: RE | Admit: 2020-05-25 | Discharge: 2020-05-25 | Disposition: A | Discharge status: Home / Self Care | Payer: 59 | Attending: Orthopedic Surgery | Admitting: Orthopedic Surgery

## 2020-05-25 ENCOUNTER — Encounter (HOSPITAL_BASED_OUTPATIENT_CLINIC_OR_DEPARTMENT_OTHER)
Admission: RE | Disposition: A | Discharge status: Home / Self Care | Payer: Self-pay | Source: Home / Self Care | Attending: Orthopedic Surgery

## 2020-05-25 ENCOUNTER — Encounter (HOSPITAL_BASED_OUTPATIENT_CLINIC_OR_DEPARTMENT_OTHER): Payer: Self-pay | Admitting: Orthopedic Surgery

## 2020-05-25 DIAGNOSIS — E119 Type 2 diabetes mellitus without complications: Secondary | ICD-10-CM | POA: Insufficient documentation

## 2020-05-25 DIAGNOSIS — G4730 Sleep apnea, unspecified: Secondary | ICD-10-CM | POA: Diagnosis not present

## 2020-05-25 DIAGNOSIS — Z7984 Long term (current) use of oral hypoglycemic drugs: Secondary | ICD-10-CM | POA: Diagnosis not present

## 2020-05-25 DIAGNOSIS — Z6841 Body Mass Index (BMI) 40.0 and over, adult: Secondary | ICD-10-CM | POA: Diagnosis not present

## 2020-05-25 DIAGNOSIS — G5601 Carpal tunnel syndrome, right upper limb: Secondary | ICD-10-CM

## 2020-05-25 DIAGNOSIS — K219 Gastro-esophageal reflux disease without esophagitis: Secondary | ICD-10-CM | POA: Diagnosis not present

## 2020-05-25 DIAGNOSIS — E669 Obesity, unspecified: Secondary | ICD-10-CM | POA: Diagnosis not present

## 2020-05-25 DIAGNOSIS — Z87891 Personal history of nicotine dependence: Secondary | ICD-10-CM | POA: Diagnosis not present

## 2020-05-25 HISTORY — DX: Other specified postprocedural states: Z98.890

## 2020-05-25 HISTORY — DX: Nausea with vomiting, unspecified: R11.2

## 2020-05-25 HISTORY — DX: Other complications of anesthesia, initial encounter: T88.59XA

## 2020-05-25 HISTORY — PX: CARPAL TUNNEL RELEASE: SHX101

## 2020-05-25 LAB — GLUCOSE, CAPILLARY
Glucose-Capillary: 145 mg/dL — ABNORMAL HIGH (ref 70–99)
Glucose-Capillary: 161 mg/dL — ABNORMAL HIGH (ref 70–99)

## 2020-05-25 SURGERY — CARPAL TUNNEL RELEASE
Anesthesia: Monitor Anesthesia Care | Site: Wrist | Laterality: Right

## 2020-05-25 MED ORDER — FENTANYL CITRATE (PF) 100 MCG/2ML IJ SOLN: 25.0000 ug | INTRAMUSCULAR | Status: DC | PRN

## 2020-05-25 MED ORDER — BUPIVACAINE HCL (PF) 0.25 % IJ SOLN
INTRAMUSCULAR | Status: DC | PRN
  Administered 2020-05-25: 18 mL

## 2020-05-25 MED ORDER — LIDOCAINE HCL (CARDIAC) PF 100 MG/5ML IV SOSY
PREFILLED_SYRINGE | INTRAVENOUS | Status: DC | PRN
  Administered 2020-05-25: 40 mg via INTRAVENOUS

## 2020-05-25 MED ORDER — ONDANSETRON HCL 4 MG/2ML IJ SOLN
INTRAMUSCULAR | Status: DC | PRN
  Administered 2020-05-25: 4 mg via INTRAVENOUS

## 2020-05-25 MED ORDER — OXYCODONE HCL 5 MG PO TABS: 5.0000 mg | ORAL_TABLET | Freq: Once | ORAL | Status: DC | PRN

## 2020-05-25 MED ORDER — POVIDONE-IODINE 10 % EX SWAB
2.0000 "application " | Freq: Once | CUTANEOUS | Status: AC
  Administered 2020-05-25: 2 via TOPICAL

## 2020-05-25 MED ORDER — MEPERIDINE HCL 25 MG/ML IJ SOLN: 6.2500 mg | INTRAMUSCULAR | Status: DC | PRN

## 2020-05-25 MED ORDER — LIDOCAINE HCL (PF) 1 % IJ SOLN
INTRAMUSCULAR | Status: AC
  Filled 2020-05-25: qty 30

## 2020-05-25 MED ORDER — TRAMADOL HCL 50 MG PO TABS: 50.0000 mg | ORAL_TABLET | Freq: Four times a day (QID) | ORAL | 0 refills | Status: DC | PRN

## 2020-05-25 MED ORDER — PROPOFOL 10 MG/ML IV BOLUS
INTRAVENOUS | Status: AC
  Filled 2020-05-25: qty 20

## 2020-05-25 MED ORDER — VANCOMYCIN HCL 1500 MG/300ML IV SOLN
1500.0000 mg | INTRAVENOUS | Status: AC
  Administered 2020-05-25: 1500 mg via INTRAVENOUS
  Filled 2020-05-25: qty 300

## 2020-05-25 MED ORDER — LIDOCAINE 2% (20 MG/ML) 5 ML SYRINGE
INTRAMUSCULAR | Status: AC
  Filled 2020-05-25: qty 5

## 2020-05-25 MED ORDER — LACTATED RINGERS IV SOLN: INTRAVENOUS | Status: DC

## 2020-05-25 MED ORDER — LIDOCAINE HCL (PF) 0.5 % IJ SOLN
INTRAMUSCULAR | Status: AC
  Filled 2020-05-25: qty 100

## 2020-05-25 MED ORDER — VANCOMYCIN HCL IN DEXTROSE 500-5 MG/100ML-% IV SOLN
INTRAVENOUS | Status: AC
  Filled 2020-05-25: qty 100

## 2020-05-25 MED ORDER — ONDANSETRON HCL 4 MG/2ML IJ SOLN
INTRAMUSCULAR | Status: AC
  Filled 2020-05-25: qty 2

## 2020-05-25 MED ORDER — FENTANYL CITRATE (PF) 100 MCG/2ML IJ SOLN: 50.0000 ug | INTRAMUSCULAR | Status: DC | PRN

## 2020-05-25 MED ORDER — IBUPROFEN 800 MG PO TABS
800.0000 mg | ORAL_TABLET | Freq: Three times a day (TID) | ORAL | 0 refills | Status: DC | PRN
Start: 2020-05-25 — End: 2020-06-15

## 2020-05-25 MED ORDER — FENTANYL CITRATE (PF) 100 MCG/2ML IJ SOLN
INTRAMUSCULAR | Status: DC | PRN
  Administered 2020-05-25: 50 ug via INTRAVENOUS

## 2020-05-25 MED ORDER — FENTANYL CITRATE (PF) 100 MCG/2ML IJ SOLN
INTRAMUSCULAR | Status: AC
  Filled 2020-05-25: qty 2

## 2020-05-25 MED ORDER — ONDANSETRON HCL 4 MG/2ML IJ SOLN: 4.0000 mg | Freq: Once | INTRAMUSCULAR | Status: DC | PRN

## 2020-05-25 MED ORDER — VANCOMYCIN HCL IN DEXTROSE 1-5 GM/200ML-% IV SOLN
INTRAVENOUS | Status: AC
  Filled 2020-05-25: qty 200

## 2020-05-25 MED ORDER — LIDOCAINE HCL (PF) 0.5 % IJ SOLN
INTRAMUSCULAR | Status: DC | PRN
  Administered 2020-05-25: 35 mL via INTRAVENOUS

## 2020-05-25 MED ORDER — MIDAZOLAM HCL 2 MG/2ML IJ SOLN
INTRAMUSCULAR | Status: DC | PRN
  Administered 2020-05-25: 2 mg via INTRAVENOUS

## 2020-05-25 MED ORDER — MIDAZOLAM HCL 2 MG/2ML IJ SOLN
INTRAMUSCULAR | Status: AC
  Filled 2020-05-25: qty 2

## 2020-05-25 MED ORDER — POVIDONE-IODINE 7.5 % EX SOLN
Freq: Once | CUTANEOUS | Status: DC
  Filled 2020-05-25: qty 118

## 2020-05-25 MED ORDER — PROPOFOL 500 MG/50ML IV EMUL
INTRAVENOUS | Status: DC | PRN
  Administered 2020-05-25: 75 ug/kg/min via INTRAVENOUS

## 2020-05-25 MED ORDER — OXYCODONE HCL 5 MG/5ML PO SOLN: 5.0000 mg | Freq: Once | ORAL | Status: DC | PRN

## 2020-05-25 MED ORDER — BUPIVACAINE HCL (PF) 0.25 % IJ SOLN
INTRAMUSCULAR | Status: AC
  Filled 2020-05-25: qty 30

## 2020-05-25 MED ORDER — KETOROLAC TROMETHAMINE 30 MG/ML IJ SOLN: 30.0000 mg | Freq: Once | INTRAMUSCULAR | Status: DC | PRN

## 2020-05-25 MED ORDER — MIDAZOLAM HCL 2 MG/2ML IJ SOLN: 1.0000 mg | INTRAMUSCULAR | Status: DC | PRN

## 2020-05-25 MED FILL — traMADol HCL 50 MG TABS: 50 | 5 days supply | Qty: 20 | Fill #0

## 2020-05-25 MED FILL — IBUPROFEN 800 MG TABS: 800 | 10 days supply | Qty: 30 | Fill #0

## 2020-05-25 SURGICAL SUPPLY — 44 items
BLADE SURG 15 STRL LF DISP TIS (BLADE) ×1 IMPLANT
BLADE SURG 15 STRL SS (BLADE) ×3
BNDG CMPR 9X4 STRL LF SNTH (GAUZE/BANDAGES/DRESSINGS)
BNDG ELASTIC 3X5.8 VLCR STR LF (GAUZE/BANDAGES/DRESSINGS) ×3 IMPLANT
BNDG ELASTIC 4X5.8 VLCR STR LF (GAUZE/BANDAGES/DRESSINGS) ×3 IMPLANT
BNDG ESMARK 4X9 LF (GAUZE/BANDAGES/DRESSINGS) IMPLANT
CLEANER CAUTERY TIP 5X5 PAD (MISCELLANEOUS) IMPLANT
CORD BIPOLAR FORCEPS 12FT (ELECTRODE) ×3 IMPLANT
COVER BACK TABLE 60X90IN (DRAPES) ×3 IMPLANT
COVER MAYO STAND STRL (DRAPES) ×3 IMPLANT
COVER WAND RF STERILE (DRAPES) IMPLANT
CUFF TOURN SGL QUICK 18X4 (TOURNIQUET CUFF) ×2 IMPLANT
DRAPE EXTREMITY T 121X128X90 (DISPOSABLE) ×3 IMPLANT
DRAPE INCISE IOBAN 66X45 STRL (DRAPES) ×1 IMPLANT
DRAPE SURG 17X23 STRL (DRAPES) ×2 IMPLANT
DRAPE UTILITY XL STRL (DRAPES) ×2 IMPLANT
DURAPREP 26ML APPLICATOR (WOUND CARE) ×3 IMPLANT
ELECT REM PT RETURN 9FT ADLT (ELECTROSURGICAL) ×3
ELECTRODE REM PT RTRN 9FT ADLT (ELECTROSURGICAL) IMPLANT
GAUZE XEROFORM 1X8 LF (GAUZE/BANDAGES/DRESSINGS) IMPLANT
GLOVE BIOGEL PI IND STRL 7.0 (GLOVE) IMPLANT
GLOVE BIOGEL PI INDICATOR 7.0 (GLOVE) ×2
GOWN STRL REUS W/ TWL LRG LVL3 (GOWN DISPOSABLE) ×1 IMPLANT
GOWN STRL REUS W/TWL LRG LVL3 (GOWN DISPOSABLE) ×3
NDL HYPO 25X1 1.5 SAFETY (NEEDLE) IMPLANT
NDL PRECISIONGLIDE 27X1.5 (NEEDLE) ×1 IMPLANT
NEEDLE HYPO 25X1 1.5 SAFETY (NEEDLE) ×3 IMPLANT
NEEDLE PRECISIONGLIDE 27X1.5 (NEEDLE) ×3 IMPLANT
NS IRRIG 1000ML POUR BTL (IV SOLUTION) ×2 IMPLANT
PAD CAST 3X4 CTTN HI CHSV (CAST SUPPLIES) IMPLANT
PAD CAST 4YDX4 CTTN HI CHSV (CAST SUPPLIES) ×1 IMPLANT
PAD CLEANER CAUTERY TIP 5X5 (MISCELLANEOUS)
PADDING CAST COTTON 3X4 STRL (CAST SUPPLIES)
PADDING CAST COTTON 4X4 STRL (CAST SUPPLIES) ×3
PENCIL SMOKE EVACUATOR (MISCELLANEOUS) ×2 IMPLANT
SET BASIN DAY SURGERY F.S. (CUSTOM PROCEDURE TRAY) ×3 IMPLANT
SPLINT PLASTER CAST XFAST 3X15 (CAST SUPPLIES) IMPLANT
SPLINT PLASTER XTRA FASTSET 3X (CAST SUPPLIES)
STOCKINETTE 4X48 STRL (DRAPES) ×3 IMPLANT
SUT ETHILON 3 0 PS 1 (SUTURE) IMPLANT
SYR BULB EAR ULCER 3OZ GRN STR (SYRINGE) ×2 IMPLANT
SYR CONTROL 10ML LL (SYRINGE) ×3 IMPLANT
TOWEL GREEN STERILE FF (TOWEL DISPOSABLE) ×3 IMPLANT
UNDERPAD 30X36 HEAVY ABSORB (UNDERPADS AND DIAPERS) ×3 IMPLANT

## 2020-05-25 NOTE — Discharge Instructions (Signed)

## 2020-05-25 NOTE — Anesthesia Postprocedure Evaluation (Signed)
Anesthesia Post Note  Patient: Stacey Clark  Procedure(s) Performed: RIGHT CARPAL TUNNEL RELEASE (Right Wrist)     Patient location during evaluation: Phase II Anesthesia Type: Regional Level of consciousness: awake Pain management: pain level controlled Vital Signs Assessment: post-procedure vital signs reviewed and stable Respiratory status: spontaneous breathing Cardiovascular status: stable Postop Assessment: no apparent nausea or vomiting Anesthetic complications: no    Last Vitals:  Vitals:   05/25/20 0830 05/25/20 0900  BP: 127/67 133/65  Pulse: 70 76  Resp: 18 18  Temp:  37 C  SpO2: 100% 96%    Last Pain:  Vitals:   05/25/20 0900  TempSrc:   PainSc: 0-No pain   Pain Goal: Patients Stated Pain Goal: 3 (05/25/20 [id number])                 [name]

## 2020-05-25 NOTE — Op Note (Signed)
NAME: Stacey Clark, Stacey Clark MEDICAL RECORD [id number] ACCOUNT [id number] DATE OF BIRTH:Jul 13, 1971 FACILITY: [facility] LOCATION: [facility] PHYSICIAN:Taira Knabe Randel Pigg, MD  OPERATIVE REPORT  DATE OF PROCEDURE:  05/25/2020  PREOPERATIVE DIAGNOSIS:  Right carpal tunnel syndrome.  POSTOPERATIVE DIAGNOSIS:  Right carpal tunnel syndrome.  PROCEDURE:  Right carpal tunnel release.  SURGEON:  Meredith Pel, MD  ASSISTANT:  Annie Main, PA  INDICATIONS:  The patient is a 49 year old patient with severe right carpal tunnel syndrome who presents for operative management after explanation of risks and benefits.  PROCEDURE IN DETAIL:  The patient was brought to the operating room where IV regional anesthetic was induced.  Preoperative antibiotics administered.  Timeout was called.  Right hand prescrubbed with alcohol and Betadine, allowed to air dry.  Prepped  with DuraPrep solution and draped in a sterile manner.  With IV regional anesthetic already induced the hand was placed on a hand table.  Incision was made at the intersection of Kaplan's cardinal line and the radial border of the 4th finger.  It was  taken down to the wrist flexion crease.  Skin and subcutaneous tissue were sharply divided.  Bleeding points encountered controlled using bipolar electrocautery.  Palmar fascia was incised.  Transverse carpal ligament was visualized.  Under direct  visualization, the transverse carpal ligament was released 2-3 mm at its midportion.  The right angle retractor was then placed and the ligament was then released under direct visualization distally avoiding the ulnar neurovascular bundle and then  proximally to the forearm fascia under direct visualization with the aid of a suitable retractor.  The motor branch was intact.  No masses in the carpal canal.  Thorough irrigation was performed.  The skin edges anesthetized using plain Marcaine.   Tourniquet released.  Bleeding points encountered controlled using  bipolar electrocautery.  The median nerve did have significant amount of erythema and evidence of compression.  Nonetheless, thorough irrigation was performed.  The incision was closed  using 3-0 nylon suture.  A well-padded volar splint applied.  Luke's assistance was required for opening and closing, retraction, mobilization of tissues.  His assistance was a medical necessity.  CN/NUANCE  D:05/25/2020 T:05/25/2020 JOB:[id number]

## 2020-05-25 NOTE — Brief Op Note (Signed)
   05/25/2020  8:24 AM  PATIENT:  Julieta Gutting  49 y.o. female  PRE-OPERATIVE DIAGNOSIS:  right carpal tunnel release  POST-OPERATIVE DIAGNOSIS:  right carpal tunnel release  PROCEDURE:  Procedure(s): RIGHT CARPAL TUNNEL RELEASE  SURGEON:  Surgeon(s): Meredith Pel, MD  ASSISTANT: magnant pa  ANESTHESIA:   regional  EBL: 2 ml    Total I/O In: 300 [IV Piggyback:300] Out: -   BLOOD ADMINISTERED: none  DRAINS: none   LOCAL MEDICATIONS USED:  Marcaine plain 18 cc  SPECIMEN:  No Specimen  COUNTS:  YES  TOURNIQUET:   Total Tourniquet Time Documented: Forearm (Right) - 24 minutes Total: Forearm (Right) - 24 minutes   DICTATION: .Other Dictation: Dictation Number [id number]  PLAN OF CARE: Discharge to home after PACU  PATIENT DISPOSITION:  PACU - hemodynamically stable

## 2020-05-25 NOTE — Interval H&P Note (Signed)
History and Physical Interval Note:  05/25/2020 7:12 AM  Stacey Clark  has presented today for surgery, with the diagnosis of right carpal tunnel release.  The various methods of treatment have been discussed with the patient and family. After consideration of risks, benefits and other options for treatment, the patient has consented to  Procedure(s): RIGHT CARPAL TUNNEL RELEASE (Right) as a surgical intervention.  The patient's history has been reviewed, patient examined, no change in status, stable for surgery.  I have reviewed the patient's chart and labs.  Questions were answered to the patient's satisfaction.     Anderson Malta

## 2020-05-25 NOTE — Transfer of Care (Signed)
Immediate Anesthesia Transfer of Care Note  Patient: Stacey Clark  Procedure(s) Performed: RIGHT CARPAL TUNNEL RELEASE (Right Wrist)  Patient Location: PACU  Anesthesia Type:Bier block  Level of Consciousness: awake, alert , oriented and patient cooperative  Airway & Oxygen Therapy: Patient Spontanous Breathing and Patient connected to face mask oxygen  Post-op Assessment: Report given to RN and Post -op Vital signs reviewed and stable  Post vital signs: Reviewed and stable  Last Vitals:  Vitals Value Taken Time  BP    Temp    Pulse    Resp    SpO2      Last Pain:  Vitals:   05/25/20 0638  TempSrc: Oral  PainSc: 5       Patients Stated Pain Goal: 3 ([id number])  Complications: No apparent anesthesia complications

## 2020-05-29 ENCOUNTER — Encounter: Payer: Self-pay | Admitting: Orthopedic Surgery

## 2020-05-30 NOTE — Telephone Encounter (Signed)
Elevate hand move fingers that's it thx

## 2020-05-31 ENCOUNTER — Telehealth: Payer: Self-pay

## 2020-05-31 ENCOUNTER — Other Ambulatory Visit: Payer: Self-pay

## 2020-05-31 ENCOUNTER — Other Ambulatory Visit: Payer: Self-pay | Admitting: Surgical

## 2020-05-31 MED ORDER — TRAMADOL HCL 50 MG PO TABS: 50.0000 mg | ORAL_TABLET | Freq: Four times a day (QID) | ORAL | 0 refills | Status: AC | PRN

## 2020-05-31 MED FILL — traMADol HCL 50 MG TABS: 50 | 5 days supply | Qty: 20 | Fill #0

## 2020-05-31 NOTE — Telephone Encounter (Signed)
Pls advise. Thanks.  

## 2020-05-31 NOTE — Telephone Encounter (Signed)
Patient would like a Rx refill on Tramadol.  Would like for Rx to be sent to [facility]?  CB# [phone].  Please advise.  Thank you.

## 2020-06-02 ENCOUNTER — Ambulatory Visit (INDEPENDENT_AMBULATORY_CARE_PROVIDER_SITE_OTHER): Payer: 59 | Admitting: Surgical

## 2020-06-02 ENCOUNTER — Other Ambulatory Visit: Payer: Self-pay

## 2020-06-02 ENCOUNTER — Encounter: Payer: Self-pay | Admitting: Surgical

## 2020-06-02 DIAGNOSIS — Z9889 Other specified postprocedural states: Secondary | ICD-10-CM

## 2020-06-02 NOTE — Progress Notes (Signed)
Post-Op Visit Note   Patient: Stacey Clark           Date of Birth: [birth year]           MRN: [id number] Visit Date: 06/02/2020 PCP: Myrlene Broker, MD   Assessment & Plan:  Chief Complaint:  Chief Complaint  Patient presents with  . Post-op Follow-up   Visit Diagnoses: No diagnosis found.  Plan: Patient is a 49 year old female who presents complaining of right hand pain.  She is s/p carpal tunnel release on 05/25/2020.  She notes significant swelling of the right hand since surgery.  Splint removed today.  Her incision is healing well and sutures were removed today and replaced with Steri-Strips.  She notes that she has had stiffness in her hand and difficulty with finger extension.  She denies any fevers, chills, night sweats, drainage, redness surrounding the incision.  Plan for her to continue finger range of motion as well as anti-inflammatories and elevation.  Swelling should continue to improve with time out from the surgery.  Plan for reevaluation in 2 weeks.  Patient agreed with this plan.  Recommended that patient notify the office sooner if she develops any signs of infection and reviewed the signs of infection with the patient.  She agreed with this plan.  Follow-Up Instructions: No follow-ups on file.   Orders:  No orders of the defined types were placed in this encounter.  No orders of the defined types were placed in this encounter.   Imaging: No results found.  PMFS History: Patient Active Problem List   Diagnosis Date Noted  . Depression 06/21/2019  . Gastroesophageal reflux disease 01/19/2019  . Vitamin D deficiency 01/04/2019  . Other fatigue 05/14/2018  . Shortness of breath on exertion 05/14/2018  . Type 2 diabetes mellitus without complication, without long-term current use of insulin ([place]) 05/14/2018  . Other hyperlipidemia 05/14/2018  . Obstructive sleep apnea syndrome 05/14/2018  . Obesity, unspecified 06/02/2014   Past Medical History:    Diagnosis Date  . Anxiety   . Asthma   . Back pain   . Complication of anesthesia   . Depression   . Diabetes mellitus without complication ([place])   . GERD (gastroesophageal reflux disease)   . HLD (hyperlipidemia)   . IBS (irritable bowel syndrome)   . Joint pain   . Obesity   . PONV (postoperative nausea and vomiting)   . Sleep apnea     Family History  Problem Relation Age of Onset  . Hypertension Mother   . Cancer Mother   . Depression Mother   . Anxiety disorder Mother   . Sleep apnea Mother   . Obesity Mother   . Hyperlipidemia Father   . Cancer Father   . Depression Father   . Anxiety disorder Father   . Bipolar disorder Father   . Alcoholism Father   . Obesity Father   . Thyroid disease Neg Hx     Past Surgical History:  Procedure Laterality Date  . CARPAL TUNNEL RELEASE Right 05/25/2020   Procedure: RIGHT CARPAL TUNNEL RELEASE;  Surgeon: Meredith Pel, MD;  Location: [facility];  Service: Orthopedics;  Laterality: Right;  . CESAREAN SECTION    . ESOPHAGOPLASTY    . TONSILLECTOMY     Social History   Occupational History  . Not on file  Tobacco Use  . Smoking status: Former Smoker    Quit date: 12/30/2006    Years since quitting: 13.4  .  Smokeless tobacco: Never Used  Substance and Sexual Activity  . Alcohol use: No    Alcohol/week: 0.0 standard drinks  . Drug use: No  . Sexual activity: Not on file

## 2020-06-05 ENCOUNTER — Inpatient Hospital Stay: Payer: 59 | Admitting: Orthopedic Surgery

## 2020-06-06 ENCOUNTER — Other Ambulatory Visit (HOSPITAL_COMMUNITY): Payer: Self-pay | Admitting: Family Medicine

## 2020-06-12 ENCOUNTER — Telehealth: Payer: Self-pay | Admitting: Radiology

## 2020-06-12 NOTE — Telephone Encounter (Signed)
Patient called triage line in regards to carpal tunnel incision. She is status post carpal tunnel release on 05/25/2020 and had her sutures removed 06/02/2020. The steri strips have fallen off and she feels that the incision has opened up some. She denies any drainage or redness. I advised that this is not uncommon post carpal tunnel release and that the incision is healing from the inside out. She has an appointment already scheduled for Thursday for follow up. She will call back if any problems with redness/drainage prior to that appt.

## 2020-06-15 ENCOUNTER — Encounter: Payer: Self-pay | Admitting: Orthopedic Surgery

## 2020-06-15 ENCOUNTER — Ambulatory Visit (INDEPENDENT_AMBULATORY_CARE_PROVIDER_SITE_OTHER): Payer: 59 | Admitting: Orthopedic Surgery

## 2020-06-15 DIAGNOSIS — Z9889 Other specified postprocedural states: Secondary | ICD-10-CM

## 2020-06-15 MED ORDER — IBUPROFEN 800 MG PO TABS: 800.0000 mg | ORAL_TABLET | Freq: Three times a day (TID) | ORAL | 0 refills | Status: DC | PRN

## 2020-06-15 MED FILL — IBUPROFEN 800 MG TABS: 800 | 10 days supply | Qty: 30 | Fill #0

## 2020-06-15 NOTE — Progress Notes (Signed)
   Post-Op Visit Note   Patient: Stacey Clark           Date of Birth: 18-Apr-1971           MRN: [id number] Visit Date: 06/15/2020 PCP: Myrlene Broker, MD   Assessment & Plan:  Chief Complaint:  Chief Complaint  Patient presents with  . Right Hand - Routine Post Op   Visit Diagnoses:  1. Status post carpal tunnel release     Plan: Patient is a 49 year old female who presents s/p right carpal tunnel release on 05/25/2020.  She notes that she is doing better and significantly improved since her previous office visit.  Swelling has improved.  She does have continued numbness tingling the tips of her fingers but this is slowly improving as well.  She is using a stress ball to work on range of motion.  Not taking any medications.  Incision has healed well with no evidence of dehiscence or infection..  Follow-Up Instructions: No follow-ups on file.   Orders:  No orders of the defined types were placed in this encounter.  No orders of the defined types were placed in this encounter.   Imaging: No results found.  PMFS History: Patient Active Problem List   Diagnosis Date Noted  . Depression 06/21/2019  . Gastroesophageal reflux disease 01/19/2019  . Vitamin D deficiency 01/04/2019  . Other fatigue 05/14/2018  . Shortness of breath on exertion 05/14/2018  . Type 2 diabetes mellitus without complication, without long-term current use of insulin ([place]) 05/14/2018  . Other hyperlipidemia 05/14/2018  . Obstructive sleep apnea syndrome 05/14/2018  . Obesity, unspecified 06/02/2014   Past Medical History:  Diagnosis Date  . Anxiety   . Asthma   . Back pain   . Complication of anesthesia   . Depression   . Diabetes mellitus without complication ([place])   . GERD (gastroesophageal reflux disease)   . HLD (hyperlipidemia)   . IBS (irritable bowel syndrome)   . Joint pain   . Obesity   . PONV (postoperative nausea and vomiting)   . Sleep apnea     Family History  Problem  Relation Age of Onset  . Hypertension Mother   . Cancer Mother   . Depression Mother   . Anxiety disorder Mother   . Sleep apnea Mother   . Obesity Mother   . Hyperlipidemia Father   . Cancer Father   . Depression Father   . Anxiety disorder Father   . Bipolar disorder Father   . Alcoholism Father   . Obesity Father   . Thyroid disease Neg Hx     Past Surgical History:  Procedure Laterality Date  . CARPAL TUNNEL RELEASE Right 05/25/2020   Procedure: RIGHT CARPAL TUNNEL RELEASE;  Surgeon: Meredith Pel, MD;  Location: [facility];  Service: Orthopedics;  Laterality: Right;  . CESAREAN SECTION    . ESOPHAGOPLASTY    . TONSILLECTOMY     Social History   Occupational History  . Not on file  Tobacco Use  . Smoking status: Former Smoker    Quit date: 12/30/2006    Years since quitting: 13.4  . Smokeless tobacco: Never Used  Substance and Sexual Activity  . Alcohol use: No    Alcohol/week: 0.0 standard drinks  . Drug use: No  . Sexual activity: Not on file

## 2020-06-21 MED FILL — TRULICITY 0.75 MG/0.5 ML PE: 0.75 | 28 days supply | Qty: 2 | Fill #1

## 2020-07-19 ENCOUNTER — Other Ambulatory Visit: Payer: Self-pay | Admitting: Family Medicine

## 2020-07-19 DIAGNOSIS — Z1231 Encounter for screening mammogram for malignant neoplasm of breast: Secondary | ICD-10-CM

## 2020-07-27 MED FILL — TRULICITY 0.75 MG/0.5 ML PE: 0.75 | 28 days supply | Qty: 2 | Fill #2

## 2020-08-04 ENCOUNTER — Ambulatory Visit
Admission: RE | Admit: 2020-08-04 | Discharge: 2020-08-04 | Disposition: A | Discharge status: Home / Self Care | Payer: 59 | Source: Ambulatory Visit | Attending: Family Medicine | Admitting: Family Medicine

## 2020-08-04 ENCOUNTER — Other Ambulatory Visit: Payer: Self-pay

## 2020-08-04 DIAGNOSIS — Z1231 Encounter for screening mammogram for malignant neoplasm of breast: Secondary | ICD-10-CM

## 2020-08-04 MED FILL — BUPROPION HCL SR 150 MG TAB: 150 | 90 days supply | Qty: 180 | Fill #0

## 2020-08-07 ENCOUNTER — Other Ambulatory Visit: Payer: Self-pay | Admitting: Family Medicine

## 2020-08-07 DIAGNOSIS — N644 Mastodynia: Secondary | ICD-10-CM

## 2020-08-09 ENCOUNTER — Ambulatory Visit (INDEPENDENT_AMBULATORY_CARE_PROVIDER_SITE_OTHER): Payer: 59 | Admitting: Orthopedic Surgery

## 2020-08-09 ENCOUNTER — Encounter: Payer: Self-pay | Admitting: Orthopedic Surgery

## 2020-08-09 DIAGNOSIS — M65319 Trigger thumb, unspecified thumb: Secondary | ICD-10-CM | POA: Diagnosis not present

## 2020-08-09 MED ORDER — MELOXICAM 15 MG PO TABS: ORAL_TABLET | ORAL | 0 refills | Status: AC

## 2020-08-09 MED FILL — MELOXICAM 15 MG TABLET: 15 | 30 days supply | Qty: 30 | Fill #0

## 2020-08-09 NOTE — Progress Notes (Signed)
Office Visit Note   Patient: Stacey Clark           Date of Birth: [birth year]           MRN: [id number] Visit Date: 08/09/2020 Requested by: Myrlene Broker, MD [place],  [place] [id number] PCP: Myrlene Broker, MD  Subjective: Chief Complaint  Patient presents with  . thumb locking    HPI: Stacey Clark is a 49 year old patient underwent carpal tunnel release 05/25/2020.  She is doing well but reports 2 to 3-week history of right thumb locking.  Denies any history of trauma to the thumb.              ROS: All systems reviewed are negative as they relate to the chief complaint within the history of present illness.  Patient denies  fevers or chills.   Assessment & Plan: Visit Diagnoses:  1. Stenosing tenosynovitis of thumb     Plan: Impression is right trigger thumb.  Plan is Mobic first for 3 weeks and topical anti-inflammatory samples provided.  If that doesn't give relief then consideration for ultrasound-guided tendon sheath injection could be performed.  If that fails then trigger thumb release would be indicated if symptoms warrant.  Follow-up as needed  Follow-Up Instructions: Return if symptoms worsen or fail to improve.   Orders:  No orders of the defined types were placed in this encounter.  Meds ordered this encounter  Medications  . meloxicam (MOBIC) 15 MG tablet    Sig: 1 po q d x 3 weeks    Dispense:  30 tablet    Refill:  0      Procedures: No procedures performed   Clinical Data: No additional findings.  Objective: Vital Signs: LMP 04/30/2018 (Within Days)   Physical Exam:   Constitutional: Patient appears well-developed HEENT:  Head: Normocephalic Eyes:EOM are normal Neck: Normal range of motion Cardiovascular: Normal rate Pulmonary/chest: Effort normal Neurologic: Patient is alert Skin: Skin is warm Psychiatric: Patient has normal mood and affect    Ortho Exam: Ortho exam demonstrates tenderness at the A1 pulley  right thumb.  EPL FPL interosseous strength intact.  Wrist range of motion intact.  Carpal tunnel incision healing well.  Specialty Comments:  No specialty comments available.  Imaging: No results found.   PMFS History: Patient Active Problem List   Diagnosis Date Noted  . Depression 06/21/2019  . Gastroesophageal reflux disease 01/19/2019  . Vitamin D deficiency 01/04/2019  . Other fatigue 05/14/2018  . Shortness of breath on exertion 05/14/2018  . Type 2 diabetes mellitus without complication, without long-term current use of insulin ([place]) 05/14/2018  . Other hyperlipidemia 05/14/2018  . Obstructive sleep apnea syndrome 05/14/2018  . Obesity, unspecified 06/02/2014   Past Medical History:  Diagnosis Date  . Anxiety   . Asthma   . Back pain   . Complication of anesthesia   . Depression   . Diabetes mellitus without complication ([place])   . GERD (gastroesophageal reflux disease)   . HLD (hyperlipidemia)   . IBS (irritable bowel syndrome)   . Joint pain   . Obesity   . PONV (postoperative nausea and vomiting)   . Sleep apnea     Family History  Problem Relation Age of Onset  . Hypertension Mother   . Cancer Mother   . Depression Mother   . Anxiety disorder Mother   . Sleep apnea Mother   . Obesity Mother   . Hyperlipidemia Father   .  Cancer Father   . Depression Father   . Anxiety disorder Father   . Bipolar disorder Father   . Alcoholism Father   . Obesity Father   . Thyroid disease Neg Hx     Past Surgical History:  Procedure Laterality Date  . CARPAL TUNNEL RELEASE Right 05/25/2020   Procedure: RIGHT CARPAL TUNNEL RELEASE;  Surgeon: Meredith Pel, MD;  Location: [facility];  Service: Orthopedics;  Laterality: Right;  . CESAREAN SECTION    . ESOPHAGOPLASTY    . TONSILLECTOMY     Social History   Occupational History  . Not on file  Tobacco Use  . Smoking status: Former Smoker    Quit date: 12/30/2006    Years since quitting: 13.6    . Smokeless tobacco: Never Used  Substance and Sexual Activity  . Alcohol use: No    Alcohol/week: 0.0 standard drinks  . Drug use: No  . Sexual activity: Not on file

## 2020-08-10 ENCOUNTER — Other Ambulatory Visit (HOSPITAL_COMMUNITY): Payer: Self-pay | Admitting: Family Medicine

## 2020-08-10 MED FILL — ESCITALOPRAM 20 MG TABLET: 20 | 90 days supply | Qty: 90 | Fill #0

## 2020-08-21 MED FILL — ALPRAZolam 0.5 MG TABS: 0.5 | 90 days supply | Qty: 180 | Fill #1

## 2020-08-21 MED FILL — TRULICITY 0.75 MG/0.5 ML PE: 0.75 | 28 days supply | Qty: 2 | Fill #3

## 2020-09-01 ENCOUNTER — Other Ambulatory Visit: Payer: Self-pay | Admitting: Family Medicine

## 2020-09-01 ENCOUNTER — Ambulatory Visit
Admission: RE | Admit: 2020-09-01 | Discharge: 2020-09-01 | Disposition: A | Discharge status: Home / Self Care | Payer: 59 | Source: Ambulatory Visit | Attending: Family Medicine | Admitting: Family Medicine

## 2020-09-01 ENCOUNTER — Other Ambulatory Visit: Payer: Self-pay

## 2020-09-01 DIAGNOSIS — N644 Mastodynia: Secondary | ICD-10-CM

## 2020-09-01 DIAGNOSIS — N6489 Other specified disorders of breast: Secondary | ICD-10-CM | POA: Diagnosis not present

## 2020-09-01 DIAGNOSIS — R928 Other abnormal and inconclusive findings on diagnostic imaging of breast: Secondary | ICD-10-CM | POA: Diagnosis not present

## 2020-09-05 ENCOUNTER — Other Ambulatory Visit (HOSPITAL_COMMUNITY): Payer: Self-pay | Admitting: Family Medicine

## 2020-09-05 MED FILL — PANTOPRAZOLE SOD DR 20 MG T: 20 | 90 days supply | Qty: 90 | Fill #0

## 2020-09-05 MED FILL — VIT D2 1.25 MG (50,000 UNIT: 1.25 MG | 84 days supply | Qty: 12 | Fill #1

## 2020-09-18 MED FILL — TRULICITY 0.75 MG/0.5 ML PE: 0.75 | 28 days supply | Qty: 2 | Fill #4

## 2020-09-19 MED FILL — FREESTYLE LITE TEST STRIP: 50 days supply | Qty: 50 | Fill #0

## 2020-09-19 MED FILL — FREESTYLE LANCETS: 90 days supply | Qty: 100 | Fill #0

## 2020-10-05 ENCOUNTER — Ambulatory Visit (INDEPENDENT_AMBULATORY_CARE_PROVIDER_SITE_OTHER): Payer: 59 | Admitting: Orthopedic Surgery

## 2020-10-05 DIAGNOSIS — M79641 Pain in right hand: Secondary | ICD-10-CM | POA: Diagnosis not present

## 2020-10-05 DIAGNOSIS — M65311 Trigger thumb, right thumb: Secondary | ICD-10-CM | POA: Diagnosis not present

## 2020-10-05 DIAGNOSIS — M65319 Trigger thumb, unspecified thumb: Secondary | ICD-10-CM

## 2020-10-06 ENCOUNTER — Encounter: Payer: Self-pay | Admitting: Orthopedic Surgery

## 2020-10-06 DIAGNOSIS — M65311 Trigger thumb, right thumb: Secondary | ICD-10-CM | POA: Diagnosis not present

## 2020-10-06 DIAGNOSIS — M79641 Pain in right hand: Secondary | ICD-10-CM | POA: Diagnosis not present

## 2020-10-06 MED ORDER — METHYLPREDNISOLONE ACETATE 40 MG/ML IJ SUSP
13.3300 mg | INTRAMUSCULAR | Status: AC | PRN
  Administered 2020-10-06: 13.33 mg

## 2020-10-06 MED ORDER — BUPIVACAINE HCL 0.25 % IJ SOLN
0.3300 mL | INTRAMUSCULAR | Status: AC | PRN
  Administered 2020-10-06: .33 mL

## 2020-10-06 MED ORDER — LIDOCAINE HCL 1 % IJ SOLN
3.0000 mL | INTRAMUSCULAR | Status: AC | PRN
  Administered 2020-10-06: 3 mL

## 2020-10-06 NOTE — Progress Notes (Signed)
Office Visit Note   Patient: Stacey Clark           Date of Birth: Sep 23, 1971           MRN: [id number] Visit Date: 10/05/2020 Requested by: Myrlene Broker, MD [place],  [place] [id number] PCP: Myrlene Broker, MD  Subjective: Chief Complaint  Patient presents with  . Right Thumb - Pain, Follow-up    HPI: Stacey Clark is a 49 year old patient with right thumb locking for the past 6 weeks.  Denies any history of injury.  Localizes pain to the palmar surface over the A1 pulley.  Denies any numbness and tingling in the thumb.  Has never had trigger digits before.  She is right-hand dominant and works as a Theme park manager.  Doing well following carpal tunnel surgery.              ROS: All systems reviewed are negative as they relate to the chief complaint within the history of present illness.  Patient denies  fevers or chills.   Assessment & Plan: Visit Diagnoses:  1. Stenosing tenosynovitis of thumb     Plan: Impression is right thumb stenosing tenosynovitis of 6 weeks duration.  Plan is ultrasound-guided injection into the tendon sheath underneath the A1 pulley which was performed today.  Follow-up as needed.  I think surgical release would be the next step if symptoms do not improve  Follow-Up Instructions: Return if symptoms worsen or fail to improve.   Orders:  No orders of the defined types were placed in this encounter.  No orders of the defined types were placed in this encounter.     Procedures: Hand/UE Inj: R thumb A1 for trigger finger on 10/06/2020 2:45 PM Indications: therapeutic Details: 25 G needle, ultrasound-guided volar approach Medications: 0.33 mL bupivacaine 0.25 %; 13.33 mg methylPREDNISolone acetate 40 MG/ML; 3 mL lidocaine 1 % Outcome: tolerated well, no immediate complications Procedure, treatment alternatives, risks and benefits explained, specific risks discussed. Consent was given by the patient. Immediately prior to procedure a time  out was called to verify the correct patient, procedure, equipment, support staff and site/side marked as required. Patient was prepped and draped in the usual sterile fashion.       Clinical Data: No additional findings.  Objective: Vital Signs: LMP 04/30/2018 (Within Days)   Physical Exam:   Constitutional: Patient appears well-developed HEENT:  Head: Normocephalic Eyes:EOM are normal Neck: Normal range of motion Cardiovascular: Normal rate Pulmonary/chest: Effort normal Neurologic: Patient is alert Skin: Skin is warm Psychiatric: Patient has normal mood and affect    Ortho Exam: Ortho exam demonstrates full active and passive range of motion of the thumb with tenderness over the A1 pulley.  EPL FPL function intact.  Collaterals are stable at the MCP joint.  Specialty Comments:  No specialty comments available.  Imaging: No results found.   PMFS History: Patient Active Problem List   Diagnosis Date Noted  . Depression 06/21/2019  . Gastroesophageal reflux disease 01/19/2019  . Vitamin D deficiency 01/04/2019  . Other fatigue 05/14/2018  . Shortness of breath on exertion 05/14/2018  . Type 2 diabetes mellitus without complication, without long-term current use of insulin ([place]) 05/14/2018  . Other hyperlipidemia 05/14/2018  . Obstructive sleep apnea syndrome 05/14/2018  . Obesity, unspecified 06/02/2014   Past Medical History:  Diagnosis Date  . Anxiety   . Asthma   . Back pain   . Complication of anesthesia   . Depression   .  Diabetes mellitus without complication ([place])   . GERD (gastroesophageal reflux disease)   . HLD (hyperlipidemia)   . IBS (irritable bowel syndrome)   . Joint pain   . Obesity   . PONV (postoperative nausea and vomiting)   . Sleep apnea     Family History  Problem Relation Age of Onset  . Hypertension Mother   . Cancer Mother   . Depression Mother   . Anxiety disorder Mother   . Sleep apnea Mother   . Obesity Mother   .  Hyperlipidemia Father   . Cancer Father   . Depression Father   . Anxiety disorder Father   . Bipolar disorder Father   . Alcoholism Father   . Obesity Father   . Thyroid disease Neg Hx     Past Surgical History:  Procedure Laterality Date  . CARPAL TUNNEL RELEASE Right 05/25/2020   Procedure: RIGHT CARPAL TUNNEL RELEASE;  Surgeon: Meredith Pel, MD;  Location: [facility];  Service: Orthopedics;  Laterality: Right;  . CESAREAN SECTION    . ESOPHAGOPLASTY    . TONSILLECTOMY     Social History   Occupational History  . Not on file  Tobacco Use  . Smoking status: Former Smoker    Quit date: 12/30/2006    Years since quitting: 13.7  . Smokeless tobacco: Never Used  Substance and Sexual Activity  . Alcohol use: No    Alcohol/week: 0.0 standard drinks  . Drug use: No  . Sexual activity: Not on file

## 2020-10-11 MED FILL — SUMAtriptan SUCCINATE 100 M: 100 | 90 days supply | Qty: 27 | Fill #1

## 2020-10-16 MED FILL — TRULICITY 0.75 MG/0.5 ML PE: 0.75 | 28 days supply | Qty: 2 | Fill #5

## 2020-10-27 ENCOUNTER — Other Ambulatory Visit: Payer: Self-pay | Admitting: Gastroenterology

## 2020-10-27 DIAGNOSIS — K219 Gastro-esophageal reflux disease without esophagitis: Secondary | ICD-10-CM | POA: Diagnosis not present

## 2020-10-27 DIAGNOSIS — Z86010 Personal history of colonic polyps: Secondary | ICD-10-CM | POA: Diagnosis not present

## 2020-10-27 DIAGNOSIS — Z1211 Encounter for screening for malignant neoplasm of colon: Secondary | ICD-10-CM | POA: Diagnosis not present

## 2020-11-07 MED FILL — TRULICITY 0.75 MG/0.5 ML PE: 0.75 | 28 days supply | Qty: 2 | Fill #6

## 2020-11-07 MED FILL — ESCITALOPRAM 20 MG TABLET: 20 | 90 days supply | Qty: 90 | Fill #1

## 2020-11-08 ENCOUNTER — Other Ambulatory Visit (HOSPITAL_COMMUNITY): Payer: Self-pay | Admitting: Family Medicine

## 2020-11-08 DIAGNOSIS — Z0000 Encounter for general adult medical examination without abnormal findings: Secondary | ICD-10-CM | POA: Diagnosis not present

## 2020-11-08 DIAGNOSIS — Z13220 Encounter for screening for lipoid disorders: Secondary | ICD-10-CM | POA: Diagnosis not present

## 2020-11-08 DIAGNOSIS — Z23 Encounter for immunization: Secondary | ICD-10-CM | POA: Diagnosis not present

## 2020-11-08 DIAGNOSIS — I10 Essential (primary) hypertension: Secondary | ICD-10-CM | POA: Diagnosis not present

## 2020-11-08 DIAGNOSIS — F39 Unspecified mood [affective] disorder: Secondary | ICD-10-CM | POA: Diagnosis not present

## 2020-11-08 DIAGNOSIS — Z79899 Other long term (current) drug therapy: Secondary | ICD-10-CM | POA: Diagnosis not present

## 2020-11-08 DIAGNOSIS — D171 Benign lipomatous neoplasm of skin and subcutaneous tissue of trunk: Secondary | ICD-10-CM | POA: Diagnosis not present

## 2020-11-08 DIAGNOSIS — Z131 Encounter for screening for diabetes mellitus: Secondary | ICD-10-CM | POA: Diagnosis not present

## 2020-11-08 DIAGNOSIS — Z8249 Family history of ischemic heart disease and other diseases of the circulatory system: Secondary | ICD-10-CM | POA: Diagnosis not present

## 2020-11-08 MED FILL — rOPINIRole HCL 0.5 MG TABS: 0.5 | 90 days supply | Qty: 90 | Fill #0

## 2020-11-08 MED FILL — BUPROPION HCL SR 150 MG TAB: 150 | 90 days supply | Qty: 180 | Fill #0

## 2020-11-22 ENCOUNTER — Other Ambulatory Visit: Payer: Self-pay

## 2020-11-22 ENCOUNTER — Encounter (HOSPITAL_COMMUNITY): Payer: Self-pay | Admitting: Gastroenterology

## 2020-11-27 MED FILL — VIT D2 1.25 MG (50,000 UNIT: 1.25 MG | 42 days supply | Qty: 6 | Fill #2

## 2020-11-27 MED FILL — PANTOPRAZOLE SOD DR 20 MG T: 20 | 90 days supply | Qty: 90 | Fill #1

## 2020-11-28 MED FILL — PLENVU 140 GM SOLR: 140 | 1 days supply | Qty: 3 | Fill #0

## 2020-11-30 ENCOUNTER — Other Ambulatory Visit (HOSPITAL_COMMUNITY)
Admission: RE | Admit: 2020-11-30 | Discharge: 2020-11-30 | Disposition: A | Discharge status: Home / Self Care | Payer: 59 | Source: Ambulatory Visit | Attending: Gastroenterology | Admitting: Gastroenterology

## 2020-11-30 DIAGNOSIS — Z01812 Encounter for preprocedural laboratory examination: Secondary | ICD-10-CM | POA: Diagnosis not present

## 2020-11-30 DIAGNOSIS — Z20822 Contact with and (suspected) exposure to covid-19: Secondary | ICD-10-CM | POA: Diagnosis not present

## 2020-11-30 DIAGNOSIS — D171 Benign lipomatous neoplasm of skin and subcutaneous tissue of trunk: Secondary | ICD-10-CM | POA: Diagnosis not present

## 2020-11-30 LAB — SARS CORONAVIRUS 2 (TAT 6-24 HRS): SARS Coronavirus 2: NEGATIVE

## 2020-12-03 NOTE — Anesthesia Preprocedure Evaluation (Addendum)
Anesthesia Evaluation  Patient identified by MRN, date of birth, ID band Patient awake    Reviewed: Allergy & Precautions, NPO status , Patient's Chart, lab work & pertinent test results  History of Anesthesia Complications (+) PONV and history of anesthetic complications  Airway Mallampati: II  TM Distance: >3 FB Neck ROM: Full    Dental no notable dental hx. (+) Teeth Intact, Dental Advisory Given   Pulmonary sleep apnea and Continuous Positive Airway Pressure Ventilation , former smoker,    Pulmonary exam normal breath sounds clear to auscultation       Cardiovascular Exercise Tolerance: Good Normal cardiovascular exam Rhythm:Regular Rate:Normal  EKG 05/18/20 NSR   Neuro/Psych PSYCHIATRIC DISORDERS Depression    GI/Hepatic GERD  ,  Endo/Other  diabetes, Type 2  Renal/GU      Musculoskeletal   Abdominal   Peds  Hematology   Anesthesia Other Findings All see list  Reproductive/Obstetrics                            Anesthesia Physical Anesthesia Plan  ASA: III  Anesthesia Plan: MAC   Post-op Pain Management:    Induction:   PONV Risk Score and Plan: Treatment may vary due to age or medical condition  Airway Management Planned: Nasal Cannula and Natural Airway  Additional Equipment: None  Intra-op Plan:   Post-operative Plan:   Informed Consent: I have reviewed the patients History and Physical, chart, labs and discussed the procedure including the risks, benefits and alternatives for the proposed anesthesia with the patient or authorized representative who has indicated his/her understanding and acceptance.     Dental advisory given  Plan Discussed with:   Anesthesia Plan Comments: (Screening colonoscopy)       Anesthesia Quick Evaluation

## 2020-12-04 ENCOUNTER — Encounter (HOSPITAL_COMMUNITY): Payer: Self-pay | Admitting: Gastroenterology

## 2020-12-04 ENCOUNTER — Ambulatory Visit (HOSPITAL_COMMUNITY): Payer: 59 | Admitting: Anesthesiology

## 2020-12-04 ENCOUNTER — Other Ambulatory Visit: Payer: Self-pay

## 2020-12-04 ENCOUNTER — Encounter (HOSPITAL_COMMUNITY)
Admission: RE | Disposition: A | Discharge status: Home / Self Care | Payer: Self-pay | Source: Home / Self Care | Attending: Gastroenterology

## 2020-12-04 ENCOUNTER — Ambulatory Visit (HOSPITAL_COMMUNITY)
Admission: RE | Admit: 2020-12-04 | Discharge: 2020-12-04 | Disposition: A | Discharge status: Home / Self Care | Payer: 59 | Attending: Gastroenterology | Admitting: Gastroenterology

## 2020-12-04 DIAGNOSIS — Z1211 Encounter for screening for malignant neoplasm of colon: Secondary | ICD-10-CM | POA: Diagnosis not present

## 2020-12-04 DIAGNOSIS — K5730 Diverticulosis of large intestine without perforation or abscess without bleeding: Secondary | ICD-10-CM | POA: Diagnosis not present

## 2020-12-04 DIAGNOSIS — Z79899 Other long term (current) drug therapy: Secondary | ICD-10-CM | POA: Diagnosis not present

## 2020-12-04 DIAGNOSIS — K219 Gastro-esophageal reflux disease without esophagitis: Secondary | ICD-10-CM | POA: Diagnosis not present

## 2020-12-04 DIAGNOSIS — Z87891 Personal history of nicotine dependence: Secondary | ICD-10-CM | POA: Diagnosis not present

## 2020-12-04 DIAGNOSIS — Z8371 Family history of colonic polyps: Secondary | ICD-10-CM | POA: Insufficient documentation

## 2020-12-04 DIAGNOSIS — K6289 Other specified diseases of anus and rectum: Secondary | ICD-10-CM | POA: Insufficient documentation

## 2020-12-04 DIAGNOSIS — K648 Other hemorrhoids: Secondary | ICD-10-CM | POA: Diagnosis not present

## 2020-12-04 DIAGNOSIS — K635 Polyp of colon: Secondary | ICD-10-CM | POA: Diagnosis not present

## 2020-12-04 DIAGNOSIS — K621 Rectal polyp: Secondary | ICD-10-CM | POA: Diagnosis not present

## 2020-12-04 DIAGNOSIS — Z6841 Body Mass Index (BMI) 40.0 and over, adult: Secondary | ICD-10-CM | POA: Insufficient documentation

## 2020-12-04 HISTORY — PX: POLYPECTOMY: SHX5525

## 2020-12-04 HISTORY — PX: COLONOSCOPY WITH PROPOFOL: SHX5780

## 2020-12-04 HISTORY — PX: BIOPSY: SHX5522

## 2020-12-04 HISTORY — DX: Personal history of other diseases of the respiratory system: Z87.09

## 2020-12-04 HISTORY — DX: Pneumonia, unspecified organism: J18.9

## 2020-12-04 LAB — GLUCOSE, CAPILLARY: Glucose-Capillary: 105 mg/dL — ABNORMAL HIGH (ref 70–99)

## 2020-12-04 SURGERY — COLONOSCOPY WITH PROPOFOL
Anesthesia: Monitor Anesthesia Care

## 2020-12-04 MED ORDER — SODIUM CHLORIDE 0.9 % IV SOLN: INTRAVENOUS | Status: DC

## 2020-12-04 MED ORDER — LIDOCAINE 2% (20 MG/ML) 5 ML SYRINGE
INTRAMUSCULAR | Status: DC | PRN
  Administered 2020-12-04: 60 mg via INTRAVENOUS

## 2020-12-04 MED ORDER — ONDANSETRON HCL 4 MG/2ML IJ SOLN
INTRAMUSCULAR | Status: DC | PRN
  Administered 2020-12-04: 4 mg via INTRAVENOUS

## 2020-12-04 MED ORDER — LACTATED RINGERS IV SOLN: INTRAVENOUS | Status: DC | PRN

## 2020-12-04 MED ORDER — PROPOFOL 1000 MG/100ML IV EMUL
INTRAVENOUS | Status: AC
  Filled 2020-12-04: qty 100

## 2020-12-04 MED ORDER — PROPOFOL 500 MG/50ML IV EMUL
INTRAVENOUS | Status: DC | PRN
  Administered 2020-12-04: 125 ug/kg/min via INTRAVENOUS

## 2020-12-04 MED ORDER — PROPOFOL 10 MG/ML IV BOLUS
INTRAVENOUS | Status: DC | PRN
  Administered 2020-12-04 (×4): 20 mg via INTRAVENOUS

## 2020-12-04 SURGICAL SUPPLY — 22 items

## 2020-12-04 NOTE — Discharge Instructions (Signed)
YOU HAD AN ENDOSCOPIC PROCEDURE TODAY: Refer to the procedure report and other information in the discharge instructions given to you for any specific questions about what was found during the examination. If this information does not answer your questions, please call [facility] office at [phone] to clarify.  ° °YOU SHOULD EXPECT: Some feelings of bloating in the abdomen. Passage of more gas than usual. Walking can help get rid of the air that was put into your GI tract during the procedure and reduce the bloating. If you had a lower endoscopy (such as a colonoscopy or flexible sigmoidoscopy) you may notice spotting of blood in your stool or on the toilet paper. Some abdominal soreness may be present for a day or two, also. ° °DIET: Your first meal following the procedure should be a light meal and then it is ok to progress to your normal diet. A half-sandwich or bowl of soup is an example of a good first meal. Heavy or fried foods are harder to digest and may make you feel nauseous or bloated. Drink plenty of fluids but you should avoid alcoholic beverages for 24 hours. If you had a esophageal dilation, please see attached instructions for diet.   ° °ACTIVITY: Your care partner should take you home directly after the procedure. You should plan to take it easy, moving slowly for the rest of the day. You can resume normal activity the day after the procedure however YOU SHOULD NOT DRIVE, use power tools, machinery or perform tasks that involve climbing or major physical exertion for 24 hours (because of the sedation medicines used during the test).  ° °SYMPTOMS TO REPORT IMMEDIATELY: °A gastroenterologist can be reached at any hour. Please call [phone]  for any of the following symptoms:  °Following lower endoscopy (colonoscopy, flexible sigmoidoscopy) °Excessive amounts of blood in the stool  °Significant tenderness, worsening of abdominal pains  °Swelling of the abdomen that is new, acute  °Fever of 100°  or higher  °Black, tarry-looking or red, bloody stools ° °FOLLOW UP:  °If any biopsies were taken you will be contacted by phone or by letter within the next 1-3 weeks. Call [phone]  if you have not heard about the biopsies in 3 weeks.  °Please also call with any specific questions about appointments or follow up tests.  °

## 2020-12-04 NOTE — Transfer of Care (Signed)
Immediate Anesthesia Transfer of Care Note  Patient: Stacey Clark  Procedure(s) Performed: COLONOSCOPY WITH PROPOFOL (N/A ) POLYPECTOMY BIOPSY  Patient Location: PACU and Endoscopy Unit  Anesthesia Type:MAC  Level of Consciousness: awake, alert , oriented and patient cooperative  Airway & Oxygen Therapy: Patient Spontanous Breathing and Patient connected to face mask oxygen  Post-op Assessment: Report given to RN and Post -op Vital signs reviewed and stable  Post vital signs: Reviewed and stable  Last Vitals:  Vitals Value Taken Time  BP    Temp    Pulse 72 12/04/20 1112  Resp 16 12/04/20 1112  SpO2 100 % 12/04/20 1112  Vitals shown include unvalidated device data.  Last Pain:  Vitals:   12/04/20 1016  TempSrc: Oral  PainSc: 0-No pain         Complications: No complications documented.

## 2020-12-04 NOTE — Op Note (Signed)
[facility] Patient Name: Stacey Clark Procedure Date: 12/04/2020 MRN: [id number] Attending MD: Otis Brace , MD Date of Birth: 1971/10/06 CSN: [id number] Age: 49 Admit Type: Outpatient Procedure:                Colonoscopy Indications:              Screening for colorectal malignant neoplasm, This                            is the patient's first colonoscopy, Family history                            of colonic polyps in a first-degree relative Providers:                Otis Brace, MD, Cleda Daub, RN, Tyrone Apple, Technician, Laureen Abrahams, [address]. [name] Resides                            CRNA, CRNA Referring MD:              Medicines:                Sedation Administered by an Anesthesia Professional Complications:            No immediate complications. Estimated Blood Loss:     Estimated blood loss was minimal. Procedure:                Pre-Anesthesia Assessment:                           - Prior to the procedure, a History and Physical                            was performed, and patient medications and                            allergies were reviewed. The patient's tolerance of                            previous anesthesia was also reviewed. The risks                            and benefits of the procedure and the sedation                            options and risks were discussed with the patient.                            All questions were answered, and informed consent                            was obtained. Prior Anticoagulants: The patient has  taken no previous anticoagulant or antiplatelet                            agents. ASA Grade Assessment: III - A patient with                            severe systemic disease. After reviewing the risks                            and benefits, the patient was deemed in                            satisfactory condition to undergo the procedure.                            After obtaining informed consent, the colonoscope                            was passed under direct vision. Throughout the                            procedure, the patient's blood pressure, pulse, and                            oxygen saturations were monitored continuously. The                            PCF-H190DL ([id number] Olympus pediatric colonscope                            was introduced through the anus and advanced to the                            the cecum, identified by appendiceal orifice and                            ileocecal valve. The colonoscopy was performed                            without difficulty. The patient tolerated the                            procedure well. The quality of the bowel                            preparation was good. Scope In: 10:43:34 AM Scope Out: 11:05:30 AM Scope Withdrawal Time: 0 hours 17 minutes 41 seconds  Total Procedure Duration: 0 hours 21 minutes 56 seconds  Findings:      The perianal and digital rectal examinations were normal.      A 3 mm polyp was found in the descending colon. The polyp was sessile.       The polyp was removed with a cold snare. Resection and retrieval were       complete.  A 3 mm polyp was found in the sigmoid colon. The polyp was sessile. The       polyp was removed with a cold snare. Resection and retrieval were       complete.      A 3 mm polyp was found in the rectum. The polyp was sessile. The polyp       was removed with a cold snare. Resection and retrieval were complete.      A few small-mouthed diverticula were found in the sigmoid colon.      Anal papilla(e) were hypertrophied. Biopsies were taken with a cold       forceps for histology.      Internal hemorrhoids were found during retroflexion. The hemorrhoids       were small. Impression:               - One 3 mm polyp in the descending colon, removed                            with a cold snare. Resected and  retrieved.                           - One 3 mm polyp in the sigmoid colon, removed with                            a cold snare. Resected and retrieved.                           - One 3 mm polyp in the rectum, removed with a cold                            snare. Resected and retrieved.                           - Diverticulosis in the sigmoid colon.                           - Anal papilla(e) were hypertrophied. Biopsied.                           - Internal hemorrhoids. Moderate Sedation:      Moderate (conscious) sedation was personally administered by an       anesthesia professional. The following parameters were monitored: oxygen       saturation, heart rate, blood pressure, and response to care. Recommendation:           - Patient has a contact number available for                            emergencies. The signs and symptoms of potential                            delayed complications were discussed with the                            patient. Return to normal activities tomorrow.  Written discharge instructions were provided to the                            patient.                           - Resume previous diet.                           - Continue present medications.                           - Await pathology results.                           - Repeat colonoscopy date to be determined after                            pending pathology results are reviewed for                            surveillance based on pathology results.                           - Return to my office PRN. Procedure Code(s):        --- Professional ---                           [phone], Colonoscopy, flexible; with removal of                            tumor(s), polyp(s), or other lesion(s) by snare                            technique                           45380, 50, Colonoscopy, flexible; with biopsy,                            single or multiple Diagnosis Code(s):         --- Professional ---                           Z12.11, Encounter for screening for malignant                            neoplasm of colon                           K63.5, Polyp of colon                           K62.1, Rectal polyp                           K64.8, Other hemorrhoids  K62.89, Other specified diseases of anus and rectum                           Z83.71, Family history of colonic polyps                           K57.30, Diverticulosis of large intestine without                            perforation or abscess without bleeding CPT copyright 2019 American Medical Association. All rights reserved. The codes documented in this report are preliminary and upon coder review may  be revised to meet current compliance requirements. Otis Brace, MD Otis Brace, MD 12/04/2020 11:12:25 AM Number of Addenda: 0

## 2020-12-04 NOTE — Anesthesia Procedure Notes (Signed)
Procedure Name: MAC Date/Time: 12/04/2020 10:39 AM Performed by: Lollie Sails, CRNA Pre-anesthesia Checklist: Patient identified, Emergency Drugs available, Suction available, Patient being monitored and Timeout performed Oxygen Delivery Method: Simple face mask

## 2020-12-04 NOTE — Anesthesia Postprocedure Evaluation (Signed)
Anesthesia Post Note  Patient: Stacey Clark  Procedure(s) Performed: COLONOSCOPY WITH PROPOFOL (N/A ) POLYPECTOMY BIOPSY     Patient location during evaluation: Endoscopy Anesthesia Type: MAC Level of consciousness: awake and alert Pain management: pain level controlled Vital Signs Assessment: post-procedure vital signs reviewed and stable Respiratory status: spontaneous breathing, nonlabored ventilation, respiratory function stable and patient connected to nasal cannula oxygen Cardiovascular status: blood pressure returned to baseline and stable Postop Assessment: no apparent nausea or vomiting Anesthetic complications: no   No complications documented.  Last Vitals:  Vitals:   12/04/20 1120 12/04/20 1130  BP: (!) 108/46 (!) 110/41  Pulse: 66 61  Resp: 17 13  Temp:    SpO2: 98% 97%    Last Pain:  Vitals:   12/04/20 1130  TempSrc:   PainSc: 0-No pain                 Barnet Glasgow

## 2020-12-04 NOTE — H&P (Signed)
Primary Care Physician:  Myrlene Broker, MD Primary Gastroenterologist:  Dr. Alessandra Bevels  Reason for Visit: Colonoscopy for colon cancer screening  HPI: Stacey Clark is a 49 y.o. female with past medical history of morbid obesity, history of chronic reflux here for her first screening colonoscopy.  She denies any active GI symptoms today.  Father was diagnosed with colon polyps unknown pathology.  Past Medical History:  Diagnosis Date  . Anxiety   . Asthma   . Back pain   . Complication of anesthesia   . Depression   . Diabetes mellitus without complication ([facility])   . GERD (gastroesophageal reflux disease)   . History of bronchitis   . HLD (hyperlipidemia)   . IBS (irritable bowel syndrome)   . Joint pain   . Obesity   . Pneumonia   . PONV (postoperative nausea and vomiting)   . Sleep apnea    uses CPAP machine    Past Surgical History:  Procedure Laterality Date  . CARPAL TUNNEL RELEASE Right 05/25/2020   Procedure: RIGHT CARPAL TUNNEL RELEASE;  Surgeon: Meredith Pel, MD;  Location: [facility];  Service: Orthopedics;  Laterality: Right;  . CESAREAN SECTION    . ESOPHAGOPLASTY    . TONSILLECTOMY      Prior to Admission medications   Medication Sig Start Date End Date Taking? Authorizing Provider  ALPRAZolam Duanne Moron) 0.5 MG tablet Take 0.5 mg by mouth 2 (two) times daily.    Yes [provider]  buPROPion (WELLBUTRIN SR) 150 MG 12 hr tablet Take 1 tablet (150 mg total) by mouth 2 (two) times daily. 06/28/19  Yes Whitmire, Dawn W, FNP  Dulaglutide (TRULICITY) 8.36 OQ/9.4TM SOPN Inject 0.75 mg into the skin once a week. Patient taking differently: Inject 0.75 mg into the skin every Sunday.  06/28/19  Yes Whitmire, Dawn W, FNP  escitalopram (LEXAPRO) 20 MG tablet Take 20 mg by mouth at bedtime.    Yes [provider]  pantoprazole (PROTONIX) 20 MG tablet Take 1 tablet (20 mg total) by mouth daily. 06/28/19  Yes Whitmire, Dawn W, FNP   rOPINIRole (REQUIP) 0.5 MG tablet Take 0.5 mg by mouth daily as needed (Restless legs).  11/08/20  Yes [provider]  Vitamin D, Ergocalciferol, (DRISDOL) 1.25 MG (50000 UNIT) CAPS capsule Take 50,000 Units by mouth every 7 (seven) days. Sunday   Yes [provider]  ibuprofen (ADVIL) 800 MG tablet Take 1 tablet (800 mg total) by mouth every 8 (eight) hours as needed. Patient not taking: Reported on 11/27/2020 06/15/20   Magnant, Gerrianne Scale, PA-C  meloxicam (MOBIC) 15 MG tablet 1 po q d x 3 weeks Patient taking differently: Take 15 mg by mouth daily as needed (siaka nerve pain).  08/09/20   Meredith Pel, MD  traMADol (ULTRAM) 50 MG tablet Take 1 tablet (50 mg total) by mouth every 6 (six) hours as needed. Patient not taking: Reported on 11/27/2020 05/31/20 05/31/21  Magnant, Gerrianne Scale, PA-C    Scheduled Meds: Continuous Infusions: . sodium chloride     PRN Meds:.  Allergies as of 10/27/2020 - Review Complete 10/06/2020  Allergen Reaction Noted  . Cephalexin Swelling 06/18/2017  . Hydrocodone Nausea And Vomiting 08/31/2016  . Rifampin Nausea And Vomiting, Rash, and Other (See Comments) 09/13/2012  . Sulfa antibiotics Hives 01/15/2012  . Imitrex [sumatriptan] Nausea And Vomiting 05/25/2020  . Augmentin [amoxicillin-pot clavulanate] Nausea And Vomiting 10/17/2017  . Metformin and related Other (See Comments) 05/14/2018  .  Penicillins Swelling     Family History  Problem Relation Age of Onset  . Hypertension Mother   . Cancer Mother   . Depression Mother   . Anxiety disorder Mother   . Sleep apnea Mother   . Obesity Mother   . Hyperlipidemia Father   . Cancer Father   . Depression Father   . Anxiety disorder Father   . Bipolar disorder Father   . Alcoholism Father   . Obesity Father   . Thyroid disease Neg Hx     Social History   Socioeconomic History  . Marital status: Married    Spouse name: Sacoya Mcgourty  . Number of children: 1  . Years of  education: Not on file  . Highest education level: Not on file  Occupational History  . Not on file  Tobacco Use  . Smoking status: Former Smoker    Quit date: 12/30/2006    Years since quitting: 13.9  . Smokeless tobacco: Never Used  Vaping Use  . Vaping Use: Never used  Substance and Sexual Activity  . Alcohol use: No    Alcohol/week: 0.0 standard drinks  . Drug use: No  . Sexual activity: Not on file  Other Topics Concern  . Not on file  Social History Narrative  . Not on file   Social Determinants of Health   Financial Resource Strain:   . Difficulty of Paying Living Expenses: Not on file  Food Insecurity:   . Worried About Charity fundraiser in the Last Year: Not on file  . Ran Out of Food in the Last Year: Not on file  Transportation Needs:   . Lack of Transportation (Medical): Not on file  . Lack of Transportation (Non-Medical): Not on file  Physical Activity:   . Days of Exercise per Week: Not on file  . Minutes of Exercise per Session: Not on file  Stress:   . Feeling of Stress : Not on file  Social Connections:   . Frequency of Communication with Friends and Family: Not on file  . Frequency of Social Gatherings with Friends and Family: Not on file  . Attends Religious Services: Not on file  . Active Member of Clubs or Organizations: Not on file  . Attends Archivist Meetings: Not on file  . Marital Status: Not on file  Intimate Partner Violence:   . Fear of Current or Ex-Partner: Not on file  . Emotionally Abused: Not on file  . Physically Abused: Not on file  . Sexually Abused: Not on file    Review of Systems: All negative except as stated above in HPI.  Physical Exam: Vital signs: There were no vitals filed for this visit.   General:   Morbidly obese, not in acute distress Lungs:  Clear throughout to auscultation.   No wheezes, crackles, or rhonchi. No acute distress. Heart:  Regular rate and rhythm; no murmurs, clicks, rubs,  or  gallops. Abdomen: Soft, nontender, nondistended, bowel sounds present.  No peritoneal signs Rectal:  Deferred  GI:  Lab Results: No results for input(s): WBC, HGB, HCT, PLT in the last 72 hours. BMET No results for input(s): NA, K, CL, CO2, GLUCOSE, BUN, CREATININE, CALCIUM in the last 72 hours. LFT No results for input(s): PROT, ALBUMIN, AST, ALT, ALKPHOS, BILITOT, BILIDIR, IBILI in the last 72 hours. PT/INR No results for input(s): LABPROT, INR in the last 72 hours.   Studies/Results: No results found.  Impression/Plan: -Colon cancer screening -Family history  of colon polyps in father -Morbid obesity  Recommendations ------------------------- -Proceed with colonoscopy.  Risks (bleeding, infection, bowel perforation that could require surgery, sedation-related changes in cardiopulmonary systems), benefits (identification and possible treatment of source of symptoms, exclusion of certain causes of symptoms), and alternatives (watchful waiting, radiographic imaging studies, empiric medical treatment)  were explained to patient in detail and patient wishes to proceed.    LOS: 0 days   Otis Brace  MD, [place] 12/04/2020, 10:15 AM  Contact #  [phone]

## 2020-12-05 LAB — SURGICAL PATHOLOGY

## 2020-12-07 DIAGNOSIS — R222 Localized swelling, mass and lump, trunk: Secondary | ICD-10-CM | POA: Diagnosis not present

## 2020-12-07 DIAGNOSIS — R2232 Localized swelling, mass and lump, left upper limb: Secondary | ICD-10-CM | POA: Diagnosis not present

## 2020-12-08 ENCOUNTER — Encounter (HOSPITAL_COMMUNITY): Payer: Self-pay | Admitting: Gastroenterology

## 2020-12-13 MED FILL — TRULICITY 0.75 MG/0.5 ML PE: 0.75 | 28 days supply | Qty: 2 | Fill #0

## 2020-12-14 DIAGNOSIS — R222 Localized swelling, mass and lump, trunk: Secondary | ICD-10-CM | POA: Diagnosis not present

## 2020-12-14 DIAGNOSIS — R2232 Localized swelling, mass and lump, left upper limb: Secondary | ICD-10-CM | POA: Diagnosis not present

## 2020-12-20 ENCOUNTER — Other Ambulatory Visit (HOSPITAL_BASED_OUTPATIENT_CLINIC_OR_DEPARTMENT_OTHER): Payer: Self-pay | Admitting: Physician Assistant

## 2020-12-20 DIAGNOSIS — J22 Unspecified acute lower respiratory infection: Secondary | ICD-10-CM | POA: Diagnosis not present

## 2020-12-20 DIAGNOSIS — Z20822 Contact with and (suspected) exposure to covid-19: Secondary | ICD-10-CM | POA: Diagnosis not present

## 2020-12-20 DIAGNOSIS — R059 Cough, unspecified: Secondary | ICD-10-CM | POA: Diagnosis not present

## 2020-12-20 MED FILL — predniSONE 20 MG TABS: 20 | 5 days supply | Qty: 15 | Fill #0

## 2020-12-20 MED FILL — DOXYCYCLINE HYCLATE 100 MG: 100 | 7 days supply | Qty: 14 | Fill #0

## 2021-01-02 ENCOUNTER — Other Ambulatory Visit (HOSPITAL_COMMUNITY): Payer: Self-pay | Admitting: Family Medicine

## 2021-01-02 DIAGNOSIS — R21 Rash and other nonspecific skin eruption: Secondary | ICD-10-CM | POA: Diagnosis not present

## 2021-01-02 DIAGNOSIS — J189 Pneumonia, unspecified organism: Secondary | ICD-10-CM | POA: Diagnosis not present

## 2021-01-02 DIAGNOSIS — Z8701 Personal history of pneumonia (recurrent): Secondary | ICD-10-CM | POA: Diagnosis not present

## 2021-01-02 DIAGNOSIS — J988 Other specified respiratory disorders: Secondary | ICD-10-CM | POA: Diagnosis not present

## 2021-01-02 MED FILL — GUAIATUSSIN AC LIQUID: 100-10 | 10 days supply | Qty: 150 | Fill #0

## 2021-01-02 MED FILL — ALBUTEROL SULFATE HFA 108 (: 108 (90 BAS | 17 days supply | Qty: 18 | Fill #0

## 2021-01-02 MED FILL — levoFLOXacin 500 MG TABS: 500 | 10 days supply | Qty: 10 | Fill #0

## 2021-01-08 MED FILL — TRULICITY 0.75 MG/0.5 ML PE: 0.75 | 28 days supply | Qty: 2 | Fill #0

## 2021-01-16 ENCOUNTER — Other Ambulatory Visit (HOSPITAL_COMMUNITY): Payer: Self-pay | Admitting: Family Medicine

## 2021-01-16 MED FILL — VIT D2 1.25 MG (50,000 UNIT: 1.25 MG | 42 days supply | Qty: 6 | Fill #0

## 2021-01-16 MED FILL — predniSONE 20 MG TABS: 20 | 5 days supply | Qty: 10 | Fill #0

## 2021-01-16 MED FILL — DOXYCYCLINE HYCLATE 100 MG: 100 | 10 days supply | Qty: 20 | Fill #0

## 2021-02-07 MED FILL — TRULICITY 0.75 MG/0.5 ML PE: 0.75 | 28 days supply | Qty: 2 | Fill #1

## 2021-02-12 ENCOUNTER — Other Ambulatory Visit (HOSPITAL_COMMUNITY): Payer: Self-pay | Admitting: Family Medicine

## 2021-02-12 MED FILL — ALPRAZolam 0.5 MG TABS: 0.5 | 90 days supply | Qty: 180 | Fill #0

## 2021-02-12 MED FILL — BUPROPION HCL SR 150 MG TAB: 150 | 90 days supply | Qty: 180 | Fill #1

## 2021-02-12 MED FILL — ESCITALOPRAM 20 MG TABLET: 20 | 90 days supply | Qty: 90 | Fill #2

## 2021-02-26 MED FILL — PANTOPRAZOLE SOD DR 20 MG T: 20 | 90 days supply | Qty: 90 | Fill #2

## 2021-03-02 ENCOUNTER — Other Ambulatory Visit (HOSPITAL_COMMUNITY): Payer: Self-pay

## 2021-03-02 MED FILL — ACETAMINOPHEN/COD #3 TABLET: 300-30 | 2 days supply | Qty: 16 | Fill #0

## 2021-03-02 MED FILL — CLINDAMYCIN HCL 150 MG CAPS: 150 | 7 days supply | Qty: 21 | Fill #0

## 2021-03-09 ENCOUNTER — Other Ambulatory Visit (HOSPITAL_COMMUNITY): Payer: Self-pay | Admitting: Family Medicine

## 2021-03-09 MED FILL — TRULICITY 0.75 MG/0.5 ML PE: 0.75 | 28 days supply | Qty: 2 | Fill #2

## 2021-03-09 MED FILL — VIT D2 1.25 MG (50,000 UNIT: 1.25 MG | 42 days supply | Qty: 6 | Fill #0

## 2021-03-15 DIAGNOSIS — E119 Type 2 diabetes mellitus without complications: Secondary | ICD-10-CM | POA: Diagnosis not present

## 2021-04-09 ENCOUNTER — Other Ambulatory Visit (HOSPITAL_COMMUNITY): Payer: Self-pay

## 2021-04-09 MED FILL — Dulaglutide Soln Auto-injector 0.75 MG/0.5ML: SUBCUTANEOUS | 28 days supply | Qty: 2 | Fill #0 | Status: AC

## 2021-04-26 ENCOUNTER — Other Ambulatory Visit (HOSPITAL_BASED_OUTPATIENT_CLINIC_OR_DEPARTMENT_OTHER): Payer: Self-pay

## 2021-04-26 MED ORDER — PSEUDOEPHEDRINE HCL 30 MG PO TABS
ORAL_TABLET | ORAL | 0 refills | Status: AC
  Filled 2021-04-26: qty 24, 24d supply, fill #0

## 2021-05-04 ENCOUNTER — Other Ambulatory Visit (HOSPITAL_COMMUNITY): Payer: Self-pay

## 2021-05-04 MED FILL — Dulaglutide Soln Auto-injector 0.75 MG/0.5ML: SUBCUTANEOUS | 28 days supply | Qty: 2 | Fill #1 | Status: AC

## 2021-05-07 ENCOUNTER — Other Ambulatory Visit (HOSPITAL_COMMUNITY): Payer: Self-pay

## 2021-05-07 MED ORDER — ERGOCALCIFEROL 1.25 MG (50000 UT) PO CAPS
1.0000 | ORAL_CAPSULE | ORAL | 0 refills | Status: DC
  Filled 2021-05-07: qty 6, 42d supply, fill #0

## 2021-05-24 ENCOUNTER — Other Ambulatory Visit (HOSPITAL_COMMUNITY): Payer: Self-pay

## 2021-05-24 MED FILL — Bupropion HCl Tab ER 12HR 150 MG: ORAL | 90 days supply | Qty: 180 | Fill #0 | Status: AC

## 2021-05-24 MED FILL — Pantoprazole Sodium EC Tab 20 MG (Base Equiv): ORAL | 90 days supply | Qty: 90 | Fill #0 | Status: AC

## 2021-05-24 MED FILL — Escitalopram Oxalate Tab 20 MG (Base Equiv): ORAL | 90 days supply | Qty: 90 | Fill #0 | Status: AC

## 2021-05-24 MED FILL — Alprazolam Tab 0.5 MG: ORAL | 90 days supply | Qty: 180 | Fill #0 | Status: AC

## 2021-05-25 ENCOUNTER — Other Ambulatory Visit (HOSPITAL_COMMUNITY): Payer: Self-pay

## 2021-05-25 DIAGNOSIS — G4733 Obstructive sleep apnea (adult) (pediatric): Secondary | ICD-10-CM | POA: Diagnosis not present

## 2021-05-25 MED ORDER — TRULICITY 0.75 MG/0.5ML ~~LOC~~ SOAJ
0.7500 mg | SUBCUTANEOUS | 3 refills | Status: DC
  Filled 2021-05-25: qty 6, 84d supply, fill #0
  Filled 2021-05-25: qty 8, 112d supply, fill #0
  Filled 2021-05-28: qty 6, 84d supply, fill #0
  Filled 2021-08-27: qty 6, 84d supply, fill #1

## 2021-05-29 ENCOUNTER — Other Ambulatory Visit (HOSPITAL_COMMUNITY): Payer: Self-pay

## 2021-05-31 ENCOUNTER — Other Ambulatory Visit (HOSPITAL_COMMUNITY): Payer: Self-pay

## 2021-07-09 ENCOUNTER — Other Ambulatory Visit (HOSPITAL_COMMUNITY): Payer: Self-pay

## 2021-07-19 DIAGNOSIS — R5381 Other malaise: Secondary | ICD-10-CM | POA: Diagnosis not present

## 2021-07-19 DIAGNOSIS — R5383 Other fatigue: Secondary | ICD-10-CM | POA: Diagnosis not present

## 2021-07-23 ENCOUNTER — Other Ambulatory Visit (HOSPITAL_COMMUNITY): Payer: Self-pay

## 2021-07-23 MED ORDER — ERGOCALCIFEROL 1.25 MG (50000 UT) PO CAPS
1.0000 | ORAL_CAPSULE | ORAL | 0 refills | Status: AC
  Filled 2021-07-23: qty 12, 84d supply, fill #0
  Filled 2021-10-17: qty 12, 84d supply, fill #1
  Filled 2021-12-31: qty 6, 42d supply, fill #2

## 2021-07-25 ENCOUNTER — Other Ambulatory Visit (HOSPITAL_COMMUNITY): Payer: Self-pay

## 2021-07-26 ENCOUNTER — Other Ambulatory Visit (HOSPITAL_COMMUNITY): Payer: Self-pay

## 2021-07-27 ENCOUNTER — Other Ambulatory Visit (HOSPITAL_COMMUNITY): Payer: Self-pay

## 2021-07-27 MED ORDER — FREESTYLE LITE TEST VI STRP
ORAL_STRIP | 3 refills | Status: AC
  Filled 2021-07-27 – 2021-09-19 (×2): qty 50, 50d supply, fill #0
  Filled 2022-04-08: qty 50, 50d supply, fill #1
  Filled 2022-07-17: qty 50, 50d supply, fill #2

## 2021-07-27 MED ORDER — FREESTYLE LANCETS MISC
3 refills | Status: AC
  Filled 2021-07-27: qty 100, 90d supply, fill #0

## 2021-07-30 ENCOUNTER — Other Ambulatory Visit: Payer: Self-pay | Admitting: Family Medicine

## 2021-07-30 DIAGNOSIS — Z1231 Encounter for screening mammogram for malignant neoplasm of breast: Secondary | ICD-10-CM

## 2021-08-01 ENCOUNTER — Other Ambulatory Visit (HOSPITAL_COMMUNITY): Payer: Self-pay

## 2021-08-06 ENCOUNTER — Other Ambulatory Visit (HOSPITAL_COMMUNITY): Payer: Self-pay

## 2021-08-13 DIAGNOSIS — G4733 Obstructive sleep apnea (adult) (pediatric): Secondary | ICD-10-CM | POA: Diagnosis not present

## 2021-08-23 ENCOUNTER — Other Ambulatory Visit (HOSPITAL_COMMUNITY): Payer: Self-pay

## 2021-08-23 MED FILL — Bupropion HCl Tab ER 12HR 150 MG: ORAL | 90 days supply | Qty: 180 | Fill #1 | Status: AC

## 2021-08-24 ENCOUNTER — Other Ambulatory Visit (HOSPITAL_COMMUNITY): Payer: Self-pay

## 2021-08-24 MED ORDER — PANTOPRAZOLE SODIUM 20 MG PO TBEC
20.0000 mg | DELAYED_RELEASE_TABLET | Freq: Every day | ORAL | 3 refills | Status: AC
  Filled 2021-08-24: qty 90, 90d supply, fill #0
  Filled 2021-12-03: qty 90, 90d supply, fill #1
  Filled 2022-03-04: qty 90, 90d supply, fill #2
  Filled 2022-06-01: qty 90, 90d supply, fill #3

## 2021-08-24 MED ORDER — ESCITALOPRAM OXALATE 20 MG PO TABS
20.0000 mg | ORAL_TABLET | Freq: Every day | ORAL | 3 refills | Status: DC
  Filled 2021-08-24: qty 90, 90d supply, fill #0
  Filled 2021-12-03: qty 90, 90d supply, fill #1
  Filled 2022-03-04: qty 90, 90d supply, fill #2
  Filled 2022-06-02: qty 90, 90d supply, fill #3

## 2021-08-24 MED ORDER — ALPRAZOLAM 0.5 MG PO TABS
0.5000 mg | ORAL_TABLET | Freq: Two times a day (BID) | ORAL | 1 refills | Status: DC
  Filled 2021-08-24: qty 180, 90d supply, fill #0
  Filled 2021-12-03: qty 180, 90d supply, fill #1

## 2021-08-27 ENCOUNTER — Other Ambulatory Visit (HOSPITAL_COMMUNITY): Payer: Self-pay

## 2021-08-30 DIAGNOSIS — R110 Nausea: Secondary | ICD-10-CM | POA: Diagnosis not present

## 2021-08-30 DIAGNOSIS — E1165 Type 2 diabetes mellitus with hyperglycemia: Secondary | ICD-10-CM | POA: Diagnosis not present

## 2021-08-30 DIAGNOSIS — H547 Unspecified visual loss: Secondary | ICD-10-CM | POA: Diagnosis not present

## 2021-09-13 ENCOUNTER — Other Ambulatory Visit (HOSPITAL_COMMUNITY): Payer: Self-pay

## 2021-09-13 DIAGNOSIS — R0602 Shortness of breath: Secondary | ICD-10-CM | POA: Diagnosis not present

## 2021-09-13 DIAGNOSIS — R059 Cough, unspecified: Secondary | ICD-10-CM | POA: Diagnosis not present

## 2021-09-13 DIAGNOSIS — R918 Other nonspecific abnormal finding of lung field: Secondary | ICD-10-CM | POA: Diagnosis not present

## 2021-09-13 DIAGNOSIS — Z20822 Contact with and (suspected) exposure to covid-19: Secondary | ICD-10-CM | POA: Diagnosis not present

## 2021-09-13 DIAGNOSIS — Z8709 Personal history of other diseases of the respiratory system: Secondary | ICD-10-CM | POA: Diagnosis not present

## 2021-09-13 DIAGNOSIS — R5383 Other fatigue: Secondary | ICD-10-CM | POA: Diagnosis not present

## 2021-09-13 MED ORDER — BENZONATATE 100 MG PO CAPS
100.0000 mg | ORAL_CAPSULE | Freq: Three times a day (TID) | ORAL | 0 refills | Status: DC | PRN
  Filled 2021-09-13: qty 20, 7d supply, fill #0

## 2021-09-13 MED ORDER — DOXYCYCLINE HYCLATE 100 MG PO CAPS
100.0000 mg | ORAL_CAPSULE | Freq: Two times a day (BID) | ORAL | 0 refills | Status: AC
  Filled 2021-09-13: qty 20, 10d supply, fill #0

## 2021-09-19 ENCOUNTER — Other Ambulatory Visit (HOSPITAL_BASED_OUTPATIENT_CLINIC_OR_DEPARTMENT_OTHER): Payer: Self-pay

## 2021-09-26 ENCOUNTER — Ambulatory Visit
Admission: RE | Admit: 2021-09-26 | Discharge: 2021-09-26 | Disposition: A | Discharge status: Home / Self Care | Payer: 59 | Source: Ambulatory Visit | Attending: Family Medicine | Admitting: Family Medicine

## 2021-09-26 ENCOUNTER — Other Ambulatory Visit: Payer: Self-pay

## 2021-09-26 DIAGNOSIS — Z1231 Encounter for screening mammogram for malignant neoplasm of breast: Secondary | ICD-10-CM

## 2021-09-28 ENCOUNTER — Other Ambulatory Visit: Payer: Self-pay

## 2021-10-04 ENCOUNTER — Other Ambulatory Visit (HOSPITAL_COMMUNITY): Payer: Self-pay

## 2021-10-04 MED ORDER — TRULICITY 0.75 MG/0.5ML ~~LOC~~ SOAJ
0.7500 mg | SUBCUTANEOUS | 3 refills | Status: DC
  Filled 2021-10-04: qty 6, 84d supply, fill #0

## 2021-10-05 ENCOUNTER — Other Ambulatory Visit (HOSPITAL_COMMUNITY): Payer: Self-pay

## 2021-10-05 MED ORDER — TRULICITY 1.5 MG/0.5ML ~~LOC~~ SOAJ
1.5000 mg | SUBCUTANEOUS | 1 refills | Status: AC
  Filled 2021-10-05: qty 6, 84d supply, fill #0
  Filled 2021-12-24 – 2021-12-31 (×2): qty 6, 84d supply, fill #1

## 2021-10-17 ENCOUNTER — Other Ambulatory Visit (HOSPITAL_BASED_OUTPATIENT_CLINIC_OR_DEPARTMENT_OTHER): Payer: Self-pay

## 2021-12-03 ENCOUNTER — Other Ambulatory Visit (HOSPITAL_COMMUNITY): Payer: Self-pay

## 2021-12-04 ENCOUNTER — Other Ambulatory Visit (HOSPITAL_BASED_OUTPATIENT_CLINIC_OR_DEPARTMENT_OTHER): Payer: Self-pay

## 2021-12-04 DIAGNOSIS — Z0000 Encounter for general adult medical examination without abnormal findings: Secondary | ICD-10-CM | POA: Diagnosis not present

## 2021-12-04 DIAGNOSIS — Z13220 Encounter for screening for lipoid disorders: Secondary | ICD-10-CM | POA: Diagnosis not present

## 2021-12-04 DIAGNOSIS — Z8249 Family history of ischemic heart disease and other diseases of the circulatory system: Secondary | ICD-10-CM | POA: Diagnosis not present

## 2021-12-04 DIAGNOSIS — Z23 Encounter for immunization: Secondary | ICD-10-CM | POA: Diagnosis not present

## 2021-12-04 DIAGNOSIS — Z1389 Encounter for screening for other disorder: Secondary | ICD-10-CM | POA: Diagnosis not present

## 2021-12-04 MED ORDER — BUPROPION HCL ER (SR) 150 MG PO TB12
150.0000 mg | ORAL_TABLET | Freq: Two times a day (BID) | ORAL | 3 refills | Status: AC
  Filled 2021-12-04 – 2022-03-04 (×2): qty 180, 90d supply, fill #0
  Filled 2022-06-01: qty 180, 90d supply, fill #1
  Filled 2022-08-27: qty 180, 90d supply, fill #2

## 2021-12-04 MED ORDER — ROPINIROLE HCL 0.5 MG PO TABS
0.5000 mg | ORAL_TABLET | Freq: Every day | ORAL | 3 refills | Status: AC
  Filled 2021-12-04: qty 90, 90d supply, fill #0
  Filled 2022-07-17: qty 90, 90d supply, fill #1

## 2021-12-05 ENCOUNTER — Other Ambulatory Visit (HOSPITAL_COMMUNITY): Payer: Self-pay

## 2021-12-05 MED ORDER — PRAVASTATIN SODIUM 20 MG PO TABS
20.0000 mg | ORAL_TABLET | Freq: Every day | ORAL | 3 refills | Status: AC
  Filled 2021-12-05 – 2021-12-11 (×2): qty 90, 90d supply, fill #0
  Filled 2022-03-04: qty 90, 90d supply, fill #1
  Filled 2022-06-01: qty 90, 90d supply, fill #2

## 2021-12-07 ENCOUNTER — Other Ambulatory Visit (HOSPITAL_COMMUNITY): Payer: Self-pay

## 2021-12-11 ENCOUNTER — Other Ambulatory Visit (HOSPITAL_COMMUNITY): Payer: Self-pay

## 2021-12-25 ENCOUNTER — Other Ambulatory Visit (HOSPITAL_COMMUNITY): Payer: Self-pay

## 2021-12-31 ENCOUNTER — Other Ambulatory Visit (HOSPITAL_COMMUNITY): Payer: Self-pay

## 2022-01-01 ENCOUNTER — Other Ambulatory Visit (HOSPITAL_BASED_OUTPATIENT_CLINIC_OR_DEPARTMENT_OTHER): Payer: Self-pay

## 2022-01-01 MED ORDER — ONDANSETRON HCL 4 MG PO TABS
ORAL_TABLET | ORAL | 0 refills | Status: AC
  Filled 2022-01-01: qty 20, 7d supply, fill #0

## 2022-01-01 MED ORDER — OSELTAMIVIR PHOSPHATE 75 MG PO CAPS
ORAL_CAPSULE | ORAL | 0 refills | Status: AC
  Filled 2022-01-01: qty 10, 5d supply, fill #0

## 2022-01-25 ENCOUNTER — Other Ambulatory Visit (HOSPITAL_COMMUNITY): Payer: Self-pay

## 2022-01-25 MED ORDER — OMRON 3 SERIES BP MONITOR DEVI
0 refills | Status: AC
  Filled 2022-01-25: qty 1, 30d supply, fill #0

## 2022-02-04 DIAGNOSIS — G4733 Obstructive sleep apnea (adult) (pediatric): Secondary | ICD-10-CM | POA: Diagnosis not present

## 2022-02-27 ENCOUNTER — Other Ambulatory Visit (HOSPITAL_COMMUNITY): Payer: Self-pay

## 2022-02-27 MED ORDER — VITAMIN D (ERGOCALCIFEROL) 1.25 MG (50000 UNIT) PO CAPS
50000.0000 [IU] | ORAL_CAPSULE | ORAL | 0 refills | Status: AC
  Filled 2022-02-27: qty 12, 84d supply, fill #0

## 2022-03-04 ENCOUNTER — Other Ambulatory Visit (HOSPITAL_COMMUNITY): Payer: Self-pay

## 2022-03-08 DIAGNOSIS — M79645 Pain in left finger(s): Secondary | ICD-10-CM | POA: Diagnosis not present

## 2022-03-11 ENCOUNTER — Other Ambulatory Visit (HOSPITAL_COMMUNITY): Payer: Self-pay

## 2022-03-11 DIAGNOSIS — M79671 Pain in right foot: Secondary | ICD-10-CM | POA: Diagnosis not present

## 2022-03-11 DIAGNOSIS — Z7985 Long-term (current) use of injectable non-insulin antidiabetic drugs: Secondary | ICD-10-CM | POA: Diagnosis not present

## 2022-03-11 DIAGNOSIS — E782 Mixed hyperlipidemia: Secondary | ICD-10-CM | POA: Diagnosis not present

## 2022-03-11 DIAGNOSIS — E1165 Type 2 diabetes mellitus with hyperglycemia: Secondary | ICD-10-CM | POA: Diagnosis not present

## 2022-03-11 DIAGNOSIS — N951 Menopausal and female climacteric states: Secondary | ICD-10-CM | POA: Diagnosis not present

## 2022-03-11 DIAGNOSIS — Z79899 Other long term (current) drug therapy: Secondary | ICD-10-CM | POA: Diagnosis not present

## 2022-03-11 MED ORDER — OZEMPIC (0.25 OR 0.5 MG/DOSE) 2 MG/1.5ML ~~LOC~~ SOPN
0.5000 mg | PEN_INJECTOR | SUBCUTANEOUS | 1 refills | Status: DC
  Filled 2022-03-11: qty 3, 56d supply, fill #0

## 2022-03-12 ENCOUNTER — Ambulatory Visit: Payer: 59 | Admitting: Orthopedic Surgery

## 2022-03-12 ENCOUNTER — Encounter: Payer: Self-pay | Admitting: Orthopedic Surgery

## 2022-03-12 ENCOUNTER — Other Ambulatory Visit: Payer: Self-pay

## 2022-03-12 ENCOUNTER — Other Ambulatory Visit (HOSPITAL_COMMUNITY): Payer: Self-pay

## 2022-03-12 DIAGNOSIS — M79645 Pain in left finger(s): Secondary | ICD-10-CM | POA: Insufficient documentation

## 2022-03-12 MED ORDER — OZEMPIC (1 MG/DOSE) 4 MG/3ML ~~LOC~~ SOPN
1.0000 mg | PEN_INJECTOR | SUBCUTANEOUS | 3 refills | Status: DC
  Filled 2022-04-01: qty 3, 28d supply, fill #0

## 2022-03-12 NOTE — Progress Notes (Signed)
? ?Office Visit Note ?  ?Patient: Stacey Clark           ?Date of Birth: 22-May-1971           ?MRN: [id number] ?Visit Date: 03/12/2022 ?             ?Requested by: Myrlene Broker, MD ?[address] ?[place],  [place] [id number] ?PCP: Myrlene Broker, MD ? ? ?Assessment & Plan: ?Visit Diagnoses:  ?1. Pain of left thumb   ? ? 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  I am unable to reproduce her pain today.  Her symptoms been significant improving since she was given a corticosteroid injection by the urgent care.  She has no evidence of instability at the thumb MP joint.  She has no CMC pain.  She has no pain over the radial styloid.  She does describe numbness in the tip of the thumb but she had a normal two-point discrimination.  Discussed that her symptoms will likely continue to improve given her current trajectory.  I can see her back as needed. ? ?Follow-Up Instructions: No follow-ups on file.  ? ?Orders:  ?No orders of the defined types were placed in this encounter. ? ?No orders of the defined types were placed in this encounter. ? ? ? ? Procedures: ?No procedures performed ? ? ?Clinical Data: ?No additional findings. ? ? ?Subjective: ?Chief Complaint  ?Patient presents with  ? Left Thumb - Pain  ?  RIGHT Handed, +n/t, weakness, Pain: 5/10 now, @ night 8/10, works as a Theme park manager, numb on tip, with shooting pains, was seen Friday at [facility] and they gave her a steroid injection.  ? ? 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  The started several weeks ago.  Her pain started gradually.  She denies any injury to the thumb.  She was seen in urgent care on Friday and given what sounds like an IM corticosteroid injection.  Since that time her symptoms are greatly improved.  She has no pain wit any range of motion of the thumb at the wrist, CMC, MP, or IP joints.  She has no  evidence of thumb instability.  She does describe some mild numbness in the very tip of her thumb. ? ? ?Review of Systems ? ? ?Objective: ?Vital Signs: BP 118/77 (BP Location: Left Arm, Patient Position: Sitting)   Pulse 63   Ht '5\' 1"'$  (1.549 m)   Wt 277 lb (125.6 kg)   LMP 04/30/2018 (Within Days)   BMI 52.34 kg/m?  ? ?Physical Exam ?Constitutional:   ?   Appearance: Normal appearance.  ?Cardiovascular:  ?   Rate and Rhythm: Normal rate.  ?   Pulses: Normal pulses.  ?Pulmonary:  ?   Effort: Pulmonary effort is normal.  ?Skin: ?   General: Skin is warm.  ?   Capillary Refill: Capillary refill takes less than 2 seconds.  ?Neurological:  ?   Mental Status: She is alert.  ? ? ?Right Hand Exam  ? ?Tenderness  ?The patient is experiencing no tenderness.  ? ?Range of Motion  ?The patient has normal right wrist ROM.  ? ?Other  ?Erythema: absent ?Sensation: normal ?Pulse: present ? ?Comments:  No TTP at radial styloid w/ negative Finkelstein test.  Negative CMC grind test.  No MP ulnar/radial laxity and no pain w/ stress testing.  No  triggering.  2PD 2m on radial and ulnar borders of thumb.  ? ? ? ? ?Specialty Comments:  ?No specialty comments available. ? ?Imaging: ?No results found. ? ? ?PMFS History: ?Patient Active Problem List  ? Diagnosis Date Noted  ? Pain of left thumb 03/12/2022  ? Depression 06/21/2019  ? Gastroesophageal reflux disease 01/19/2019  ? Vitamin D deficiency 01/04/2019  ? Other fatigue 05/14/2018  ? Shortness of breath on exertion 05/14/2018  ? Type 2 diabetes mellitus without complication, without long-term current use of insulin ([facility] 05/14/2018  ? Other hyperlipidemia 05/14/2018  ? Obstructive sleep apnea syndrome 05/14/2018  ? Obesity, unspecified 06/02/2014  ? ?Past Medical History:  ?Diagnosis Date  ? Anxiety   ? Asthma   ? Back pain   ? Complication of anesthesia   ? Depression   ? Diabetes mellitus without complication ([facility]   ? GERD (gastroesophageal reflux disease)   ? History of  bronchitis   ? HLD (hyperlipidemia)   ? IBS (irritable bowel syndrome)   ? Joint pain   ? Obesity   ? Pneumonia   ? PONV (postoperative nausea and vomiting)   ? Sleep apnea   ? uses CPAP machine  ?  ?Family History  ?Problem Relation Age of Onset  ? Hypertension Mother   ? Cancer Mother   ? Depression Mother   ? Anxiety disorder Mother   ? Sleep apnea Mother   ? Obesity Mother   ? Hyperlipidemia Father   ? Cancer Father   ? Depression Father   ? Anxiety disorder Father   ? Bipolar disorder Father   ? Alcoholism Father   ? Obesity Father   ? Thyroid disease Neg Hx   ?  ?Past Surgical History:  ?Procedure Laterality Date  ? BIOPSY  12/04/2020  ? Procedure: BIOPSY;  Surgeon: BOtis Brace MD;  Location: [facility];  Service: Gastroenterology;;  ? CARPAL TUNNEL RELEASE Right 05/25/2020  ? Procedure: RIGHT CARPAL TUNNEL RELEASE;  Surgeon: DMeredith Pel MD;  [place] [facility]  Service: Orthopedics;  Laterality: Right;  ? CESAREAN SECTION    ? COLONOSCOPY WITH PROPOFOL N/A 12/04/2020  ? Procedure: COLONOSCOPY WITH PROPOFOL;  Surgeon: BOtis Brace MD;  Location: [facility];  Service: Gastroenterology;  Laterality: N/A;  ? ESOPHAGOPLASTY    ? POLYPECTOMY  12/04/2020  ? Procedure: POLYPECTOMY;  Surgeon: BOtis Brace MD;  Location: [facility];  Service: Gastroenterology;;  ? TONSILLECTOMY    ? ?Social History  ? ?Occupational History  ? Not on file  ?Tobacco Use  ? Smoking status: Former  ?  Types: Cigarettes  ?  Quit date: 12/30/2006  ?  Years since quitting: 15.2  ? Smokeless tobacco: Never  ?Vaping Use  ? Vaping Use: Never used  ?Substance and Sexual Activity  ? Alcohol use: No  ?  Alcohol/week: 0.0 standard drinks  ? Drug use: No  ? Sexual activity: Not on file  ? ? ? ? ? ? ?

## 2022-03-18 DIAGNOSIS — H33321 Round hole, right eye: Secondary | ICD-10-CM | POA: Diagnosis not present

## 2022-03-18 DIAGNOSIS — E109 Type 1 diabetes mellitus without complications: Secondary | ICD-10-CM | POA: Diagnosis not present

## 2022-03-18 DIAGNOSIS — Z7984 Long term (current) use of oral hypoglycemic drugs: Secondary | ICD-10-CM | POA: Diagnosis not present

## 2022-03-18 DIAGNOSIS — H2513 Age-related nuclear cataract, bilateral: Secondary | ICD-10-CM | POA: Diagnosis not present

## 2022-03-18 DIAGNOSIS — E119 Type 2 diabetes mellitus without complications: Secondary | ICD-10-CM | POA: Diagnosis not present

## 2022-03-18 DIAGNOSIS — H25013 Cortical age-related cataract, bilateral: Secondary | ICD-10-CM | POA: Diagnosis not present

## 2022-03-19 ENCOUNTER — Other Ambulatory Visit: Payer: Self-pay

## 2022-03-20 ENCOUNTER — Ambulatory Visit: Payer: 59 | Admitting: Orthopedic Surgery

## 2022-03-25 ENCOUNTER — Other Ambulatory Visit (HOSPITAL_COMMUNITY): Payer: Self-pay

## 2022-03-25 DIAGNOSIS — M67871 Other specified disorders of synovium, right ankle and foot: Secondary | ICD-10-CM | POA: Diagnosis not present

## 2022-03-25 DIAGNOSIS — M25571 Pain in right ankle and joints of right foot: Secondary | ICD-10-CM | POA: Diagnosis not present

## 2022-03-25 MED ORDER — MELOXICAM 7.5 MG PO TABS
7.5000 mg | ORAL_TABLET | Freq: Two times a day (BID) | ORAL | 0 refills | Status: AC
  Filled 2022-03-25: qty 60, 30d supply, fill #0

## 2022-03-28 DIAGNOSIS — E559 Vitamin D deficiency, unspecified: Secondary | ICD-10-CM | POA: Diagnosis not present

## 2022-03-29 ENCOUNTER — Other Ambulatory Visit (HOSPITAL_COMMUNITY): Payer: Self-pay

## 2022-03-29 MED ORDER — D3-50 1.25 MG (50000 UT) PO CAPS
50000.0000 [IU] | ORAL_CAPSULE | ORAL | 0 refills | Status: DC
  Filled 2022-03-29: qty 12, 84d supply, fill #0
  Filled 2022-07-17: qty 12, 84d supply, fill #1
  Filled 2022-09-24: qty 12, 84d supply, fill #2

## 2022-04-01 ENCOUNTER — Other Ambulatory Visit (HOSPITAL_COMMUNITY): Payer: Self-pay

## 2022-04-02 ENCOUNTER — Other Ambulatory Visit (HOSPITAL_COMMUNITY): Payer: Self-pay

## 2022-04-08 ENCOUNTER — Other Ambulatory Visit (HOSPITAL_COMMUNITY): Payer: Self-pay

## 2022-04-08 MED ORDER — FREESTYLE LITE TEST VI STRP
1.0000 | ORAL_STRIP | Freq: Every day | 3 refills | Status: AC
  Filled 2022-04-08 – 2022-09-24 (×2): qty 50, 50d supply, fill #0

## 2022-04-11 ENCOUNTER — Other Ambulatory Visit (HOSPITAL_COMMUNITY): Payer: Self-pay

## 2022-04-19 ENCOUNTER — Other Ambulatory Visit (HOSPITAL_COMMUNITY): Payer: Self-pay

## 2022-04-19 DIAGNOSIS — H1045 Other chronic allergic conjunctivitis: Secondary | ICD-10-CM | POA: Diagnosis not present

## 2022-04-19 MED ORDER — LOTEMAX SM 0.38 % OP GEL
1.0000 [drp] | Freq: Three times a day (TID) | OPHTHALMIC | 1 refills | Status: DC
  Filled 2022-04-19: qty 5, 7d supply, fill #0

## 2022-04-30 ENCOUNTER — Other Ambulatory Visit (HOSPITAL_COMMUNITY): Payer: Self-pay

## 2022-04-30 MED ORDER — OZEMPIC (2 MG/DOSE) 8 MG/3ML ~~LOC~~ SOPN
2.0000 mg | PEN_INJECTOR | SUBCUTANEOUS | 3 refills | Status: AC
  Filled 2022-04-30: qty 9, 84d supply, fill #0
  Filled 2022-07-17 – 2022-07-22 (×2): qty 9, 84d supply, fill #1

## 2022-05-23 DIAGNOSIS — I83811 Varicose veins of right lower extremities with pain: Secondary | ICD-10-CM | POA: Diagnosis not present

## 2022-05-30 ENCOUNTER — Other Ambulatory Visit (HOSPITAL_COMMUNITY): Payer: Self-pay

## 2022-06-03 ENCOUNTER — Other Ambulatory Visit (HOSPITAL_COMMUNITY): Payer: Self-pay

## 2022-06-10 ENCOUNTER — Other Ambulatory Visit: Payer: Self-pay

## 2022-06-10 DIAGNOSIS — I8393 Asymptomatic varicose veins of bilateral lower extremities: Secondary | ICD-10-CM

## 2022-06-11 ENCOUNTER — Other Ambulatory Visit (HOSPITAL_COMMUNITY): Payer: Self-pay

## 2022-06-11 ENCOUNTER — Ambulatory Visit (HOSPITAL_COMMUNITY)
Admission: RE | Admit: 2022-06-11 | Discharge: 2022-06-11 | Disposition: A | Discharge status: Home / Self Care | Payer: 59 | Source: Ambulatory Visit | Attending: Vascular Surgery | Admitting: Vascular Surgery

## 2022-06-11 DIAGNOSIS — M1712 Unilateral primary osteoarthritis, left knee: Secondary | ICD-10-CM | POA: Diagnosis not present

## 2022-06-11 DIAGNOSIS — I872 Venous insufficiency (chronic) (peripheral): Secondary | ICD-10-CM | POA: Diagnosis not present

## 2022-06-11 DIAGNOSIS — I8393 Asymptomatic varicose veins of bilateral lower extremities: Secondary | ICD-10-CM | POA: Insufficient documentation

## 2022-06-11 DIAGNOSIS — M7122 Synovial cyst of popliteal space [Baker], left knee: Secondary | ICD-10-CM | POA: Diagnosis not present

## 2022-06-11 DIAGNOSIS — M79605 Pain in left leg: Secondary | ICD-10-CM | POA: Diagnosis not present

## 2022-06-11 DIAGNOSIS — M25562 Pain in left knee: Secondary | ICD-10-CM | POA: Diagnosis not present

## 2022-06-11 MED ORDER — DICLOFENAC SODIUM 1 % EX GEL
CUTANEOUS | 0 refills | Status: AC
  Filled 2022-06-11: qty 100, 20d supply, fill #0

## 2022-06-12 ENCOUNTER — Ambulatory Visit: Payer: 59 | Admitting: Vascular Surgery

## 2022-06-12 ENCOUNTER — Encounter: Payer: Self-pay | Admitting: Vascular Surgery

## 2022-06-12 VITALS — BP 117/73 | HR 67 | Temp 98.7°F | Resp 18 | Ht 60.0 in | Wt 276.0 lb

## 2022-06-12 DIAGNOSIS — I872 Venous insufficiency (chronic) (peripheral): Secondary | ICD-10-CM

## 2022-06-12 NOTE — Progress Notes (Signed)
ASSESSMENT & PLAN   CHRONIC VENOUS INSUFFICIENCY: This patient has CEAP C2 venous disease.  We have discussed the importance of intermittent leg elevation and the proper positioning for this.  In addition I have recommended that she wear at least knee-high compression stockings with a gradient of 15 to 20 mmHg at work.  We also discussed the importance of exercise.  I have encouraged her to avoid prolonged sitting and standing.  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  We also discussed the importance of maintaining a healthy weight as central obesity especially increases lower extremity venous pressure.  Currently I do not think she is a good candidate for laser ablation.  She does have some reflux and an anterior accessory saphenous vein on the right but I looked at the vein myself with the SonoSite and the vein was fairly short and was not especially dilated.  There was no significant reflux in the great saphenous vein of the small saphenous vein.  If her symptoms progress in the future then certainly we can reevaluate.  She understands that venous disease does tend to progress over time.  REASON FOR CONSULT:    Bilateral lower extremity varicose veins.  The consult is requested by Dr. Janace Litten.  HPI:   Stacey Clark is a 51 y.o. female who was referred with varicose veins of both lower extremities.  I have reviewed the records from the referring office.  The patient is followed with essential hypertension, reflux esophagitis, irritable bowel syndrome, type 2 diabetes, mixed dyslipidemia, obesity, and history of dependent edema.  On 05/23/2022 the patient was seen with varicose veins that were painful.  The symptoms have been going on for about 2 weeks.  Patient was therefore sent for vascular consultation.  On my history this patient is noted some varicose veins in the right thigh and describes some burning pain in the distal lateral  right thigh adjacent to these varicosities.  She also describes some aching pain in her legs associated with prolonged standing and relieved somewhat with elevation.  She has not been wearing compression stockings.  She has no previous history of DVT.  She has had no previous venous procedures.  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  Her symptoms are worse at the end of the day.  Past Medical History:  Diagnosis Date   Anxiety    Asthma    Back pain    Complication of anesthesia    Depression    Diabetes mellitus without complication ([facility])    GERD (gastroesophageal reflux disease)    History of bronchitis    HLD (hyperlipidemia)    IBS (irritable bowel syndrome)    Joint pain    Obesity    Pneumonia    PONV (postoperative nausea and vomiting)    Sleep apnea    uses CPAP machine    Family History  Problem Relation Age of Onset   Hypertension Mother    Cancer Mother    Depression Mother    Anxiety disorder Mother    Sleep apnea Mother    Obesity Mother    Hyperlipidemia Father    Cancer Father    Depression Father    Anxiety disorder Father    Bipolar disorder Father    Alcoholism Father    Obesity Father    Thyroid disease Neg Hx  SOCIAL HISTORY: Social History   Tobacco Use   Smoking status: Former    Types: Cigarettes    Quit date: 12/30/2006    Years since quitting: 15.4   Smokeless tobacco: Never  Substance Use Topics   Alcohol use: No    Alcohol/week: 0.0 standard drinks of alcohol    Allergies  Allergen Reactions   Cephalexin Swelling   Hydrocodone Nausea And Vomiting   Rifampin Nausea And Vomiting, Rash and Other (See Comments)    Nausea, Vomiting   Sulfa Antibiotics Hives   Imitrex [Sumatriptan] Nausea And Vomiting   Augmentin [Amoxicillin-Pot Clavulanate] Nausea And Vomiting   Metformin And Related Other (See Comments)    GI upset   Penicillins Swelling    Reaction: 2018    Current Outpatient  Medications  Medication Sig Dispense Refill   ALPRAZolam (XANAX) 0.5 MG tablet Take 0.5 mg by mouth 2 (two) times daily.      ALPRAZolam (XANAX) 0.5 MG tablet Take 1 tablet (0.5 mg total) by mouth 2 (two) times daily. 180 tablet 1   Blood Pressure Monitoring (OMRON 3 SERIES BP MONITOR) DEVI Use as directed 1 each 0   buPROPion (WELLBUTRIN SR) 150 MG 12 hr tablet Take 1 tablet (150 mg total) by mouth 2 (two) times daily. 60 tablet 0   buPROPion (WELLBUTRIN SR) 150 MG 12 hr tablet Take 1 tablet (150 mg total) by mouth 2 (two) times daily. 180 tablet 3   Cholecalciferol (D3-50) 1.25 MG (50000 UT) capsule Take 1 capsule (50,000 Units total) by mouth every 7 (seven) days. 30 capsule 0   diclofenac Sodium (VOLTAREN) 1 % GEL Apply a nickel sized amount to the knee up to every 6 hours as needed.  Wash hands thoroughly after use. 100 g 0   Dulaglutide (TRULICITY) 1.5 VW/0.9WJ SOPN Inject 1.5 mg into the skin every 7 (seven) days. 6 mL 1   ergocalciferol (VITAMIN D2) 1.25 MG (50000 UT) capsule TAKE 1 CAPSULE BY MOUTH ONCE A WEEK. 30 capsule 0   escitalopram (LEXAPRO) 20 MG tablet Take 20 mg by mouth at bedtime.      escitalopram (LEXAPRO) 20 MG tablet Take 1 tablet (20 mg total) by mouth daily. 90 tablet 3   glucose blood (FREESTYLE LITE) test strip USE TO CHECK BLOOD SUGAR DAILY 100 strip 3   glucose blood (FREESTYLE LITE) test strip USE TO CHECK BLOOD SUGAR DAILY 100 strip 3   Lancets (FREESTYLE) lancets Use as directed daily 200 each 3   meloxicam (MOBIC) 15 MG tablet 1 po q d x 3 weeks (Patient taking differently: Take 15 mg by mouth daily as needed (siaka nerve pain).) 30 tablet 0   meloxicam (MOBIC) 7.5 MG tablet Take 1 tablet (7.5 mg total) by mouth 2 (two) times daily for 2 weeks, then take 1 tablet twice daily as needed. 60 tablet 0   ondansetron (ZOFRAN) 4 MG tablet Take 1 tablet (4 mg total) by mouth every 8 (eight) hours as needed for up to 7 days for Nausea / Vomiting. 20 tablet 0    pantoprazole (PROTONIX) 20 MG tablet Take 1 tablet (20 mg total) by mouth daily. 30 tablet 0   pantoprazole (PROTONIX) 20 MG tablet Take 1 tablet (20 mg total) by mouth daily. 90 tablet 3   pravastatin (PRAVACHOL) 20 MG tablet Take 1 tablet (20 mg total) by mouth at bedtime. 90 tablet 3   rOPINIRole (REQUIP) 0.5 MG tablet Take 0.5 mg by mouth daily as needed (Restless  legs).      rOPINIRole (REQUIP) 0.5 MG tablet Take 1 tablet (0.5 mg total) by mouth at bedtime. 90 tablet 3   Semaglutide, 2 MG/DOSE, (OZEMPIC, 2 MG/DOSE,) 8 MG/3ML SOPN Inject 2 mg into the skin once a week. 9 mL 3   Vitamin D, Ergocalciferol, (DRISDOL) 1.25 MG (50000 UNIT) CAPS capsule Take 1 capsule (50,000 Units total) by mouth once a week. 30 capsule 0   albuterol (VENTOLIN HFA) 108 (90 Base) MCG/ACT inhaler INHALE 2 PUFFS INTO THE LUNGS EVERY 4 (FOUR) HOURS AS NEEDED FOR WHEEZING. 18 g 2   benzonatate (TESSALON) 100 MG capsule Take 1 capsule (100 mg total) by mouth 3 (three) times daily as needed for up to 7 days for cough. 20 capsule 0   buPROPion (WELLBUTRIN SR) 150 MG 12 hr tablet TAKE 1 TABLET BY MOUTH TWICE A DAY 180 tablet 3   Dulaglutide (TRULICITY) 6.59 DJ/5.7SV SOPN Inject 0.5 mLs (0.75 mg total) into the skin once a week. 8 mL 3   escitalopram (LEXAPRO) 20 MG tablet TAKE 1 TABLET (20 MG TOTAL) BY MOUTH DAILY. 90 tablet 3   ibuprofen (ADVIL) 800 MG tablet Take 1 tablet (800 mg total) by mouth every 8 (eight) hours as needed. (Patient not taking: Reported on 11/27/2020) 30 tablet 0   Loteprednol Etabonate (LOTEMAX SM) 0.38 % GEL Place 1 drop into both eyes 3 (three) times daily as directed for 7 days. 5 g 1   oseltamivir (TAMIFLU) 75 MG capsule Take 1 capsule (75 mg total) by mouth 2 times daily for 5 days. 10 capsule 0   pseudoephedrine (SUDAFED) 30 MG tablet Take 1-2 tablets by mouth every 4-6 hours as needed for congestion. 30 tablet 0   rOPINIRole (REQUIP) 0.5 MG tablet TAKE 1 TABLET (0.5 MG TOTAL) BY MOUTH AT BEDTIME.  90 tablet 3   Vitamin D, Ergocalciferol, (DRISDOL) 1.25 MG (50000 UNIT) CAPS capsule Take 50,000 Units by mouth every 7 (seven) days. Sunday     No current facility-administered medications for this visit.    REVIEW OF SYSTEMS:  '[X]'$  denotes positive finding, '[ ]'$  denotes negative finding Cardiac  Comments:  Chest pain or chest pressure:    Shortness of breath upon exertion:    Short of breath when lying flat:    Irregular heart rhythm:        Vascular    Pain in calf, thigh, or hip brought on by ambulation:    Pain in feet at night that wakes you up from your sleep:     Blood clot in your veins:    Leg swelling:  x       Pulmonary    Oxygen at home:    Productive cough:     Wheezing:         Neurologic    Sudden weakness in arms or legs:     Sudden numbness in arms or legs:     Sudden onset of difficulty speaking or slurred speech:    Temporary loss of vision in one eye:     Problems with dizziness:         Gastrointestinal    Blood in stool:     Vomited blood:         Genitourinary    Burning when urinating:     Blood in urine:        Psychiatric    Major depression:         Hematologic    Bleeding problems:  Problems with blood clotting too easily:        Skin    Rashes or ulcers:        Constitutional    Fever or chills:    -  PHYSICAL EXAM:   Vitals:   06/12/22 1432  BP: 117/73  Pulse: 67  Resp: 18  Temp: 98.7 F (37.1 C)  TempSrc: Temporal  SpO2: 96%  Weight: 276 lb (125.2 kg)  Height: 5' (1.524 m)   Body mass index is 53.9 kg/m. GENERAL: The patient is a well-nourished female, in no acute distress. The vital signs are documented above. CARDIAC: There is a regular rate and rhythm.  VASCULAR: I do not detect carotid bruits. She has palpable dorsalis pedis and posterior tibial pulses bilaterally. She does have some varicose veins in her anterior lateral right thigh and right leg.     PULMONARY: There is good air exchange bilaterally  without wheezing or rales. ABDOMEN: Soft and non-tender with normal pitched bowel sounds.  MUSCULOSKELETAL: There are no major deformities. NEUROLOGIC: No focal weakness or paresthesias are detected. SKIN: There are no ulcers or rashes noted. PSYCHIATRIC: The patient has a normal affect.  DATA:    VENOUS DUPLEX: I have independently interpreted the venous duplex scan that was done on 06/11/2022.  This was of the right lower extremity only.  There was no evidence of DVT.  There was deep venous reflux involving the common femoral vein and femoral vein.  There was superficial venous reflux in the anterior accessory saphenous vein which I diameters ranging from 5.1-5.5 mm.     Deitra Mayo [facility]

## 2022-06-14 ENCOUNTER — Other Ambulatory Visit (HOSPITAL_COMMUNITY): Payer: Self-pay

## 2022-06-27 ENCOUNTER — Other Ambulatory Visit (HOSPITAL_COMMUNITY): Payer: Self-pay

## 2022-06-27 DIAGNOSIS — G8929 Other chronic pain: Secondary | ICD-10-CM | POA: Diagnosis not present

## 2022-06-27 DIAGNOSIS — M25562 Pain in left knee: Secondary | ICD-10-CM | POA: Diagnosis not present

## 2022-06-27 MED ORDER — CELECOXIB 200 MG PO CAPS
200.0000 mg | ORAL_CAPSULE | Freq: Two times a day (BID) | ORAL | 2 refills | Status: AC
  Filled 2022-06-27: qty 60, 30d supply, fill #0

## 2022-07-11 ENCOUNTER — Other Ambulatory Visit (HOSPITAL_COMMUNITY): Payer: Self-pay

## 2022-07-11 DIAGNOSIS — M1712 Unilateral primary osteoarthritis, left knee: Secondary | ICD-10-CM | POA: Diagnosis not present

## 2022-07-11 DIAGNOSIS — R140 Abdominal distension (gaseous): Secondary | ICD-10-CM | POA: Diagnosis not present

## 2022-07-11 DIAGNOSIS — K2100 Gastro-esophageal reflux disease with esophagitis, without bleeding: Secondary | ICD-10-CM | POA: Diagnosis not present

## 2022-07-11 MED ORDER — PANTOPRAZOLE SODIUM 40 MG PO TBEC
40.0000 mg | DELAYED_RELEASE_TABLET | Freq: Every morning | ORAL | 3 refills | Status: AC
Start: 2022-07-11 — End: ?
  Filled 2022-07-11: qty 90, 90d supply, fill #0
  Filled 2022-08-27 – 2022-11-05 (×2): qty 90, 90d supply, fill #1
  Filled 2023-04-30: qty 90, 90d supply, fill #2

## 2022-07-11 MED ORDER — FAMOTIDINE 40 MG PO TABS
40.0000 mg | ORAL_TABLET | Freq: Every day | ORAL | 3 refills | Status: DC
Start: 2022-07-11 — End: 2023-06-02
  Filled 2022-07-11: qty 90, 90d supply, fill #0
  Filled 2022-08-27 – 2022-10-11 (×2): qty 90, 90d supply, fill #1
  Filled 2023-01-14: qty 90, 90d supply, fill #2
  Filled 2023-04-16: qty 90, 90d supply, fill #3

## 2022-07-18 ENCOUNTER — Other Ambulatory Visit (HOSPITAL_COMMUNITY): Payer: Self-pay

## 2022-07-22 ENCOUNTER — Other Ambulatory Visit (HOSPITAL_BASED_OUTPATIENT_CLINIC_OR_DEPARTMENT_OTHER): Payer: Self-pay

## 2022-07-22 ENCOUNTER — Other Ambulatory Visit (HOSPITAL_COMMUNITY): Payer: Self-pay

## 2022-07-23 ENCOUNTER — Other Ambulatory Visit (HOSPITAL_COMMUNITY): Payer: Self-pay

## 2022-07-23 MED ORDER — TRAMADOL HCL 50 MG PO TABS
50.0000 mg | ORAL_TABLET | Freq: Four times a day (QID) | ORAL | 1 refills | Status: AC | PRN
  Filled 2022-07-23: qty 40, 5d supply, fill #0
  Filled 2022-08-27: qty 40, 5d supply, fill #1

## 2022-07-24 DIAGNOSIS — R1011 Right upper quadrant pain: Secondary | ICD-10-CM | POA: Diagnosis not present

## 2022-07-24 DIAGNOSIS — K760 Fatty (change of) liver, not elsewhere classified: Secondary | ICD-10-CM | POA: Diagnosis not present

## 2022-07-24 DIAGNOSIS — R1031 Right lower quadrant pain: Secondary | ICD-10-CM | POA: Diagnosis not present

## 2022-07-24 DIAGNOSIS — N938 Other specified abnormal uterine and vaginal bleeding: Secondary | ICD-10-CM | POA: Diagnosis not present

## 2022-08-08 DIAGNOSIS — J40 Bronchitis, not specified as acute or chronic: Secondary | ICD-10-CM | POA: Diagnosis not present

## 2022-08-16 ENCOUNTER — Other Ambulatory Visit: Payer: Self-pay | Admitting: Orthopedic Surgery

## 2022-08-16 DIAGNOSIS — M25562 Pain in left knee: Secondary | ICD-10-CM

## 2022-08-27 ENCOUNTER — Other Ambulatory Visit (HOSPITAL_COMMUNITY): Payer: Self-pay

## 2022-08-27 ENCOUNTER — Ambulatory Visit
Admission: RE | Admit: 2022-08-27 | Discharge: 2022-08-27 | Disposition: A | Discharge status: Home / Self Care | Payer: 59 | Source: Ambulatory Visit | Attending: Orthopedic Surgery | Admitting: Orthopedic Surgery

## 2022-08-27 DIAGNOSIS — M25562 Pain in left knee: Secondary | ICD-10-CM

## 2022-08-27 DIAGNOSIS — S83232A Complex tear of medial meniscus, current injury, left knee, initial encounter: Secondary | ICD-10-CM | POA: Diagnosis not present

## 2022-08-28 ENCOUNTER — Other Ambulatory Visit (HOSPITAL_COMMUNITY): Payer: Self-pay

## 2022-08-28 MED ORDER — ESCITALOPRAM OXALATE 20 MG PO TABS
20.0000 mg | ORAL_TABLET | Freq: Every day | ORAL | 3 refills | Status: AC
  Filled 2022-08-28: qty 90, 90d supply, fill #0
  Filled 2022-11-25: qty 90, 90d supply, fill #1
  Filled 2023-03-02: qty 90, 90d supply, fill #2
  Filled 2023-08-28: qty 90, 90d supply, fill #3

## 2022-08-29 ENCOUNTER — Other Ambulatory Visit (HOSPITAL_COMMUNITY): Payer: Self-pay

## 2022-08-29 DIAGNOSIS — G4733 Obstructive sleep apnea (adult) (pediatric): Secondary | ICD-10-CM | POA: Diagnosis not present

## 2022-08-30 ENCOUNTER — Other Ambulatory Visit (HOSPITAL_COMMUNITY): Payer: Self-pay

## 2022-08-30 MED ORDER — ALPRAZOLAM 0.5 MG PO TABS
0.5000 mg | ORAL_TABLET | Freq: Two times a day (BID) | ORAL | 1 refills | Status: AC
  Filled 2022-08-30: qty 32, 16d supply, fill #0
  Filled 2022-08-30: qty 148, 74d supply, fill #0

## 2022-09-03 DIAGNOSIS — M1712 Unilateral primary osteoarthritis, left knee: Secondary | ICD-10-CM | POA: Diagnosis not present

## 2022-09-03 DIAGNOSIS — S83232A Complex tear of medial meniscus, current injury, left knee, initial encounter: Secondary | ICD-10-CM | POA: Diagnosis not present

## 2022-09-04 DIAGNOSIS — N950 Postmenopausal bleeding: Secondary | ICD-10-CM | POA: Diagnosis not present

## 2022-09-05 DIAGNOSIS — N950 Postmenopausal bleeding: Secondary | ICD-10-CM | POA: Diagnosis not present

## 2022-09-17 ENCOUNTER — Other Ambulatory Visit (HOSPITAL_COMMUNITY): Payer: Self-pay

## 2022-09-25 ENCOUNTER — Other Ambulatory Visit (HOSPITAL_COMMUNITY): Payer: Self-pay

## 2022-09-25 MED ORDER — MOUNJARO 5 MG/0.5ML ~~LOC~~ SOAJ
5.0000 mg | SUBCUTANEOUS | 1 refills | Status: AC
  Filled 2022-09-25: qty 2, 28d supply, fill #0

## 2022-09-25 MED ORDER — D3-50 1.25 MG (50000 UT) PO CAPS
50000.0000 [IU] | ORAL_CAPSULE | ORAL | 0 refills | Status: AC
  Filled 2022-09-25: qty 12, 84d supply, fill #0
  Filled 2022-12-04: qty 12, 84d supply, fill #1
  Filled 2023-03-24: qty 4, 28d supply, fill #2
  Filled 2023-08-28: qty 2, 14d supply, fill #3

## 2022-10-02 ENCOUNTER — Other Ambulatory Visit (HOSPITAL_COMMUNITY): Payer: Self-pay

## 2022-10-02 DIAGNOSIS — S83232A Complex tear of medial meniscus, current injury, left knee, initial encounter: Secondary | ICD-10-CM | POA: Diagnosis not present

## 2022-10-02 DIAGNOSIS — M6752 Plica syndrome, left knee: Secondary | ICD-10-CM | POA: Diagnosis not present

## 2022-10-02 DIAGNOSIS — G8918 Other acute postprocedural pain: Secondary | ICD-10-CM | POA: Diagnosis not present

## 2022-10-02 DIAGNOSIS — M659 Synovitis and tenosynovitis, unspecified: Secondary | ICD-10-CM | POA: Diagnosis not present

## 2022-10-02 DIAGNOSIS — M65862 Other synovitis and tenosynovitis, left lower leg: Secondary | ICD-10-CM | POA: Diagnosis not present

## 2022-10-02 DIAGNOSIS — M94262 Chondromalacia, left knee: Secondary | ICD-10-CM | POA: Diagnosis not present

## 2022-10-02 MED ORDER — OXYCODONE HCL 5 MG PO TABS
5.0000 mg | ORAL_TABLET | ORAL | 0 refills | Status: AC | PRN
  Filled 2022-10-02: qty 10, 2d supply, fill #0

## 2022-10-02 MED ORDER — IBUPROFEN 600 MG PO TABS
600.0000 mg | ORAL_TABLET | Freq: Three times a day (TID) | ORAL | 0 refills | Status: AC | PRN
  Filled 2022-10-02: qty 30, 10d supply, fill #0

## 2022-10-08 ENCOUNTER — Other Ambulatory Visit: Payer: Self-pay | Admitting: Family Medicine

## 2022-10-08 DIAGNOSIS — M25662 Stiffness of left knee, not elsewhere classified: Secondary | ICD-10-CM | POA: Diagnosis not present

## 2022-10-08 DIAGNOSIS — Z1231 Encounter for screening mammogram for malignant neoplasm of breast: Secondary | ICD-10-CM

## 2022-10-08 DIAGNOSIS — Z9889 Other specified postprocedural states: Secondary | ICD-10-CM | POA: Diagnosis not present

## 2022-10-11 ENCOUNTER — Other Ambulatory Visit (HOSPITAL_COMMUNITY): Payer: Self-pay

## 2022-10-11 MED ORDER — CIPROFLOXACIN HCL 500 MG PO TABS
500.0000 mg | ORAL_TABLET | Freq: Two times a day (BID) | ORAL | 0 refills | Status: AC
  Filled 2022-10-11: qty 14, 7d supply, fill #0

## 2022-10-15 ENCOUNTER — Other Ambulatory Visit (HOSPITAL_COMMUNITY): Payer: Self-pay

## 2022-10-15 MED ORDER — ALBUTEROL SULFATE HFA 108 (90 BASE) MCG/ACT IN AERS
2.0000 | INHALATION_SPRAY | RESPIRATORY_TRACT | 2 refills | Status: AC | PRN
  Filled 2022-10-15: qty 6.7, 17d supply, fill #0

## 2022-10-21 ENCOUNTER — Other Ambulatory Visit (HOSPITAL_COMMUNITY): Payer: Self-pay

## 2022-10-28 ENCOUNTER — Other Ambulatory Visit (HOSPITAL_COMMUNITY): Payer: Self-pay

## 2022-10-28 MED ORDER — MOUNJARO 7.5 MG/0.5ML ~~LOC~~ SOAJ
7.5000 mg | SUBCUTANEOUS | 1 refills | Status: AC
  Filled 2022-10-28: qty 2, 28d supply, fill #0

## 2022-11-05 ENCOUNTER — Other Ambulatory Visit (HOSPITAL_COMMUNITY): Payer: Self-pay

## 2022-11-11 ENCOUNTER — Ambulatory Visit
Admission: RE | Admit: 2022-11-11 | Discharge: 2022-11-11 | Disposition: A | Discharge status: Home / Self Care | Payer: 59 | Source: Ambulatory Visit | Attending: Family Medicine | Admitting: Family Medicine

## 2022-11-11 DIAGNOSIS — Z1231 Encounter for screening mammogram for malignant neoplasm of breast: Secondary | ICD-10-CM | POA: Diagnosis not present

## 2022-11-18 ENCOUNTER — Other Ambulatory Visit (HOSPITAL_COMMUNITY): Payer: Self-pay

## 2022-11-18 MED ORDER — DOXYCYCLINE HYCLATE 100 MG PO CAPS
100.0000 mg | ORAL_CAPSULE | Freq: Two times a day (BID) | ORAL | 0 refills | Status: AC
  Filled 2022-11-18: qty 20, 10d supply, fill #0

## 2022-11-25 ENCOUNTER — Other Ambulatory Visit (HOSPITAL_COMMUNITY): Payer: Self-pay

## 2022-12-03 ENCOUNTER — Other Ambulatory Visit (HOSPITAL_COMMUNITY): Payer: Self-pay

## 2022-12-04 ENCOUNTER — Other Ambulatory Visit (HOSPITAL_COMMUNITY): Payer: Self-pay

## 2022-12-04 DIAGNOSIS — Z1211 Encounter for screening for malignant neoplasm of colon: Secondary | ICD-10-CM | POA: Diagnosis not present

## 2022-12-04 DIAGNOSIS — E559 Vitamin D deficiency, unspecified: Secondary | ICD-10-CM | POA: Diagnosis not present

## 2022-12-04 DIAGNOSIS — E1165 Type 2 diabetes mellitus with hyperglycemia: Secondary | ICD-10-CM | POA: Diagnosis not present

## 2022-12-04 DIAGNOSIS — G2581 Restless legs syndrome: Secondary | ICD-10-CM | POA: Diagnosis not present

## 2022-12-04 DIAGNOSIS — F419 Anxiety disorder, unspecified: Secondary | ICD-10-CM | POA: Diagnosis not present

## 2022-12-04 DIAGNOSIS — Z0000 Encounter for general adult medical examination without abnormal findings: Secondary | ICD-10-CM | POA: Diagnosis not present

## 2022-12-04 MED ORDER — MOUNJARO 15 MG/0.5ML ~~LOC~~ SOAJ
15.0000 mg | SUBCUTANEOUS | 3 refills | Status: DC
  Filled 2022-12-04: qty 2, 28d supply, fill #0
  Filled 2023-03-24 – 2023-08-14 (×2): qty 2, 28d supply, fill #1
  Filled 2023-09-12: qty 2, 28d supply, fill #2
  Filled 2023-10-13: qty 2, 28d supply, fill #3

## 2022-12-04 MED ORDER — ROPINIROLE HCL 0.5 MG PO TABS
0.5000 mg | ORAL_TABLET | Freq: Every day | ORAL | 3 refills | Status: AC
  Filled 2022-12-04: qty 90, 90d supply, fill #0
  Filled 2023-04-30: qty 90, 90d supply, fill #1
  Filled 2023-08-28: qty 90, 90d supply, fill #2

## 2022-12-04 MED ORDER — BUPROPION HCL ER (SR) 150 MG PO TB12
150.0000 mg | ORAL_TABLET | Freq: Two times a day (BID) | ORAL | 3 refills | Status: AC
  Filled 2022-12-04: qty 180, 90d supply, fill #0
  Filled 2023-03-02: qty 180, 90d supply, fill #1
  Filled 2023-08-28: qty 180, 90d supply, fill #2
  Filled 2023-11-30: qty 180, 90d supply, fill #3

## 2022-12-05 ENCOUNTER — Other Ambulatory Visit (HOSPITAL_COMMUNITY): Payer: Self-pay

## 2022-12-05 DIAGNOSIS — Z23 Encounter for immunization: Secondary | ICD-10-CM | POA: Diagnosis not present

## 2022-12-05 MED ORDER — EZETIMIBE 10 MG PO TABS
10.0000 mg | ORAL_TABLET | Freq: Every day | ORAL | 3 refills | Status: AC
  Filled 2022-12-05 – 2023-08-28 (×2): qty 90, 90d supply, fill #0
  Filled 2023-10-15: qty 90, 90d supply, fill #1

## 2022-12-06 ENCOUNTER — Other Ambulatory Visit (HOSPITAL_COMMUNITY): Payer: Self-pay

## 2022-12-16 DIAGNOSIS — G8929 Other chronic pain: Secondary | ICD-10-CM | POA: Diagnosis not present

## 2022-12-16 DIAGNOSIS — Z9889 Other specified postprocedural states: Secondary | ICD-10-CM | POA: Diagnosis not present

## 2022-12-16 DIAGNOSIS — M25562 Pain in left knee: Secondary | ICD-10-CM | POA: Diagnosis not present

## 2022-12-31 ENCOUNTER — Other Ambulatory Visit (HOSPITAL_COMMUNITY): Payer: Self-pay

## 2022-12-31 ENCOUNTER — Other Ambulatory Visit (HOSPITAL_BASED_OUTPATIENT_CLINIC_OR_DEPARTMENT_OTHER): Payer: Self-pay

## 2022-12-31 MED ORDER — MOUNJARO 15 MG/0.5ML ~~LOC~~ SOAJ
15.0000 mg | SUBCUTANEOUS | 3 refills | Status: AC
  Filled 2022-12-31 (×2): qty 2, 28d supply, fill #0
  Filled 2023-01-23: qty 2, 28d supply, fill #1
  Filled 2023-02-24: qty 2, 28d supply, fill #2
  Filled 2023-04-07 – 2023-07-15 (×2): qty 2, 28d supply, fill #3

## 2023-01-02 ENCOUNTER — Other Ambulatory Visit (HOSPITAL_COMMUNITY): Payer: Self-pay

## 2023-01-03 ENCOUNTER — Other Ambulatory Visit (HOSPITAL_BASED_OUTPATIENT_CLINIC_OR_DEPARTMENT_OTHER): Payer: Self-pay

## 2023-01-03 DIAGNOSIS — N3001 Acute cystitis with hematuria: Secondary | ICD-10-CM | POA: Diagnosis not present

## 2023-01-03 DIAGNOSIS — R109 Unspecified abdominal pain: Secondary | ICD-10-CM | POA: Diagnosis not present

## 2023-01-03 DIAGNOSIS — B9689 Other specified bacterial agents as the cause of diseases classified elsewhere: Secondary | ICD-10-CM | POA: Diagnosis not present

## 2023-01-03 DIAGNOSIS — N760 Acute vaginitis: Secondary | ICD-10-CM | POA: Diagnosis not present

## 2023-01-03 DIAGNOSIS — N23 Unspecified renal colic: Secondary | ICD-10-CM | POA: Diagnosis not present

## 2023-01-03 MED ORDER — CIPROFLOXACIN HCL 500 MG PO TABS
ORAL_TABLET | ORAL | 0 refills | Status: AC
  Filled 2023-01-03: qty 14, 7d supply, fill #0

## 2023-01-03 MED ORDER — METRONIDAZOLE 500 MG PO TABS
250.0000 mg | ORAL_TABLET | Freq: Three times a day (TID) | ORAL | 0 refills | Status: AC
  Filled 2023-01-03: qty 11, 8d supply, fill #0

## 2023-01-04 ENCOUNTER — Other Ambulatory Visit (HOSPITAL_COMMUNITY): Payer: Self-pay

## 2023-01-17 ENCOUNTER — Other Ambulatory Visit (HOSPITAL_COMMUNITY): Payer: Self-pay

## 2023-01-20 ENCOUNTER — Other Ambulatory Visit (HOSPITAL_COMMUNITY): Payer: Self-pay

## 2023-01-20 MED ORDER — ONDANSETRON HCL 4 MG PO TABS
4.0000 mg | ORAL_TABLET | Freq: Three times a day (TID) | ORAL | 0 refills | Status: DC | PRN
  Filled 2023-01-20: qty 20, 7d supply, fill #0

## 2023-02-03 ENCOUNTER — Other Ambulatory Visit (HOSPITAL_BASED_OUTPATIENT_CLINIC_OR_DEPARTMENT_OTHER): Payer: Self-pay

## 2023-02-03 MED ORDER — FREESTYLE LITE TEST VI STRP
ORAL_STRIP | 3 refills | Status: AC
  Filled 2023-02-03: qty 100, 50d supply, fill #0
  Filled 2023-08-28: qty 100, 50d supply, fill #1
  Filled 2023-11-13: qty 100, 50d supply, fill #2
  Filled 2024-01-12: qty 100, 50d supply, fill #3

## 2023-02-03 MED ORDER — PANTOPRAZOLE SODIUM 40 MG PO TBEC
40.0000 mg | DELAYED_RELEASE_TABLET | Freq: Every morning | ORAL | 3 refills | Status: AC
  Filled 2023-02-03: qty 90, 90d supply, fill #0
  Filled 2023-08-14: qty 90, 90d supply, fill #1
  Filled 2023-10-13 – 2023-10-27 (×2): qty 90, 90d supply, fill #2
  Filled 2024-01-12: qty 90, 90d supply, fill #3

## 2023-02-20 ENCOUNTER — Other Ambulatory Visit (HOSPITAL_COMMUNITY): Payer: Self-pay

## 2023-02-20 MED ORDER — PREDNISONE 20 MG PO TABS
20.0000 mg | ORAL_TABLET | Freq: Two times a day (BID) | ORAL | 0 refills | Status: AC
  Filled 2023-02-20: qty 10, 5d supply, fill #0

## 2023-02-20 MED ORDER — DOXYCYCLINE HYCLATE 100 MG PO CAPS
100.0000 mg | ORAL_CAPSULE | Freq: Two times a day (BID) | ORAL | 0 refills | Status: AC
  Filled 2023-02-20: qty 20, 10d supply, fill #0

## 2023-02-24 ENCOUNTER — Other Ambulatory Visit (HOSPITAL_COMMUNITY): Payer: Self-pay

## 2023-03-12 ENCOUNTER — Other Ambulatory Visit (HOSPITAL_COMMUNITY): Payer: Self-pay

## 2023-03-24 ENCOUNTER — Other Ambulatory Visit (HOSPITAL_COMMUNITY): Payer: Self-pay

## 2023-03-25 ENCOUNTER — Other Ambulatory Visit (HOSPITAL_COMMUNITY): Payer: Self-pay

## 2023-03-28 ENCOUNTER — Other Ambulatory Visit (HOSPITAL_BASED_OUTPATIENT_CLINIC_OR_DEPARTMENT_OTHER): Payer: Self-pay

## 2023-03-28 ENCOUNTER — Other Ambulatory Visit (HOSPITAL_COMMUNITY): Payer: Self-pay

## 2023-03-28 MED ORDER — MOUNJARO 10 MG/0.5ML ~~LOC~~ SOAJ
10.0000 mg | SUBCUTANEOUS | 0 refills | Status: DC
  Filled 2023-03-28 (×2): qty 2, 28d supply, fill #0

## 2023-03-31 DIAGNOSIS — E1165 Type 2 diabetes mellitus with hyperglycemia: Secondary | ICD-10-CM | POA: Diagnosis not present

## 2023-04-01 ENCOUNTER — Other Ambulatory Visit (HOSPITAL_COMMUNITY): Payer: Self-pay

## 2023-04-01 MED ORDER — WEGOVY 1 MG/0.5ML ~~LOC~~ SOAJ
1.0000 mg | SUBCUTANEOUS | 3 refills | Status: DC
  Filled 2023-04-01: qty 2, 28d supply, fill #0

## 2023-04-03 ENCOUNTER — Other Ambulatory Visit (HOSPITAL_COMMUNITY): Payer: Self-pay

## 2023-04-07 ENCOUNTER — Other Ambulatory Visit (HOSPITAL_COMMUNITY): Payer: Self-pay

## 2023-04-10 ENCOUNTER — Other Ambulatory Visit (HOSPITAL_COMMUNITY): Payer: Self-pay

## 2023-04-10 MED ORDER — OZEMPIC (2 MG/DOSE) 8 MG/3ML ~~LOC~~ SOPN
2.0000 mg | PEN_INJECTOR | SUBCUTANEOUS | 3 refills | Status: DC
  Filled 2023-04-10: qty 3, 28d supply, fill #0
  Filled 2023-05-27: qty 9, 84d supply, fill #1

## 2023-04-16 ENCOUNTER — Other Ambulatory Visit (HOSPITAL_COMMUNITY): Payer: Self-pay

## 2023-04-18 ENCOUNTER — Other Ambulatory Visit (HOSPITAL_COMMUNITY): Payer: Self-pay

## 2023-04-18 MED ORDER — AZITHROMYCIN 500 MG PO TABS
500.0000 mg | ORAL_TABLET | Freq: Every day | ORAL | 0 refills | Status: DC
  Filled 2023-04-18 – 2023-04-19 (×2): qty 5, 5d supply, fill #0

## 2023-04-19 ENCOUNTER — Other Ambulatory Visit (HOSPITAL_COMMUNITY): Payer: Self-pay

## 2023-04-21 ENCOUNTER — Other Ambulatory Visit (HOSPITAL_COMMUNITY): Payer: Self-pay

## 2023-04-22 ENCOUNTER — Other Ambulatory Visit (HOSPITAL_COMMUNITY): Payer: Self-pay

## 2023-04-22 MED ORDER — CHOLECALCIFEROL 1.25 MG (50000 UT) PO CAPS
50000.0000 [IU] | ORAL_CAPSULE | ORAL | 0 refills | Status: DC
  Filled 2023-04-22: qty 12, 84d supply, fill #0
  Filled 2023-07-15: qty 12, 84d supply, fill #1
  Filled 2023-10-15: qty 4, 28d supply, fill #2

## 2023-04-22 MED ORDER — ALBUTEROL SULFATE HFA 108 (90 BASE) MCG/ACT IN AERS
2.0000 | INHALATION_SPRAY | RESPIRATORY_TRACT | 2 refills | Status: DC | PRN
  Filled 2023-04-22: qty 6.7, 25d supply, fill #0
  Filled 2023-11-13: qty 6.7, 25d supply, fill #1
  Filled 2024-04-02: qty 6.7, 25d supply, fill #2

## 2023-04-25 ENCOUNTER — Other Ambulatory Visit (HOSPITAL_COMMUNITY): Payer: Self-pay

## 2023-04-25 DIAGNOSIS — L821 Other seborrheic keratosis: Secondary | ICD-10-CM | POA: Diagnosis not present

## 2023-04-25 DIAGNOSIS — L578 Other skin changes due to chronic exposure to nonionizing radiation: Secondary | ICD-10-CM | POA: Diagnosis not present

## 2023-04-25 DIAGNOSIS — D229 Melanocytic nevi, unspecified: Secondary | ICD-10-CM | POA: Diagnosis not present

## 2023-04-25 DIAGNOSIS — D485 Neoplasm of uncertain behavior of skin: Secondary | ICD-10-CM | POA: Diagnosis not present

## 2023-04-25 DIAGNOSIS — L814 Other melanin hyperpigmentation: Secondary | ICD-10-CM | POA: Diagnosis not present

## 2023-04-25 DIAGNOSIS — D225 Melanocytic nevi of trunk: Secondary | ICD-10-CM | POA: Diagnosis not present

## 2023-04-25 DIAGNOSIS — L918 Other hypertrophic disorders of the skin: Secondary | ICD-10-CM | POA: Diagnosis not present

## 2023-04-25 DIAGNOSIS — D1801 Hemangioma of skin and subcutaneous tissue: Secondary | ICD-10-CM | POA: Diagnosis not present

## 2023-04-25 DIAGNOSIS — L304 Erythema intertrigo: Secondary | ICD-10-CM | POA: Diagnosis not present

## 2023-04-25 MED ORDER — NYSTATIN 100000 UNIT/GM EX CREA
1.0000 | TOPICAL_CREAM | Freq: Two times a day (BID) | CUTANEOUS | 0 refills | Status: AC
  Filled 2023-04-25: qty 30, 15d supply, fill #0

## 2023-05-01 DIAGNOSIS — E119 Type 2 diabetes mellitus without complications: Secondary | ICD-10-CM | POA: Diagnosis not present

## 2023-05-07 ENCOUNTER — Other Ambulatory Visit (HOSPITAL_COMMUNITY): Payer: Self-pay

## 2023-05-07 MED ORDER — CLINDAMYCIN HCL 150 MG PO CAPS
ORAL_CAPSULE | ORAL | 1 refills | Status: AC
  Filled 2023-05-07 – 2023-05-21 (×2): qty 21, 7d supply, fill #0

## 2023-05-07 MED ORDER — CHLORHEXIDINE GLUCONATE 0.12 % MT SOLN
OROMUCOSAL | 0 refills | Status: DC
  Filled 2023-05-07: qty 473, 16d supply, fill #0

## 2023-05-07 MED ORDER — IBUPROFEN 800 MG PO TABS
800.0000 mg | ORAL_TABLET | Freq: Four times a day (QID) | ORAL | 1 refills | Status: AC | PRN
  Filled 2023-05-07 – 2023-05-21 (×2): qty 21, 6d supply, fill #0

## 2023-05-21 ENCOUNTER — Other Ambulatory Visit (HOSPITAL_COMMUNITY): Payer: Self-pay

## 2023-05-22 ENCOUNTER — Other Ambulatory Visit (HOSPITAL_BASED_OUTPATIENT_CLINIC_OR_DEPARTMENT_OTHER): Payer: Self-pay

## 2023-05-22 ENCOUNTER — Other Ambulatory Visit (HOSPITAL_COMMUNITY): Payer: Self-pay

## 2023-05-22 MED ORDER — ACETAMINOPHEN-CODEINE 300-30 MG PO TABS
1.0000 | ORAL_TABLET | ORAL | 0 refills | Status: DC | PRN
  Filled 2023-05-22: qty 16, 3d supply, fill #0

## 2023-05-27 ENCOUNTER — Other Ambulatory Visit (HOSPITAL_BASED_OUTPATIENT_CLINIC_OR_DEPARTMENT_OTHER): Payer: Self-pay

## 2023-05-28 ENCOUNTER — Other Ambulatory Visit (HOSPITAL_BASED_OUTPATIENT_CLINIC_OR_DEPARTMENT_OTHER): Payer: Self-pay

## 2023-06-02 ENCOUNTER — Other Ambulatory Visit (HOSPITAL_COMMUNITY): Payer: Self-pay

## 2023-06-02 MED ORDER — BUPROPION HCL ER (SR) 150 MG PO TB12
150.0000 mg | ORAL_TABLET | Freq: Two times a day (BID) | ORAL | 3 refills | Status: DC
  Filled 2023-06-02: qty 180, 90d supply, fill #0
  Filled 2024-02-26: qty 180, 90d supply, fill #1

## 2023-06-02 MED ORDER — ESCITALOPRAM OXALATE 20 MG PO TABS
20.0000 mg | ORAL_TABLET | Freq: Every day | ORAL | 3 refills | Status: DC
  Filled 2023-06-02: qty 90, 90d supply, fill #0
  Filled 2023-09-17: qty 90, 90d supply, fill #1
  Filled 2023-12-27: qty 90, 90d supply, fill #2

## 2023-06-02 MED ORDER — FAMOTIDINE 40 MG PO TABS
40.0000 mg | ORAL_TABLET | Freq: Every day | ORAL | 3 refills | Status: DC
  Filled 2023-07-15: qty 90, 90d supply, fill #0
  Filled 2023-10-13: qty 90, 90d supply, fill #1
  Filled 2024-01-12: qty 90, 90d supply, fill #2
  Filled 2024-04-12: qty 90, 90d supply, fill #3

## 2023-06-02 MED ORDER — ALPRAZOLAM 0.5 MG PO TABS
0.5000 mg | ORAL_TABLET | Freq: Two times a day (BID) | ORAL | 1 refills | Status: DC
  Filled 2023-06-02: qty 180, 90d supply, fill #0
  Filled 2023-11-13: qty 180, 90d supply, fill #1

## 2023-06-02 MED ORDER — PANTOPRAZOLE SODIUM 40 MG PO TBEC: 40.0000 mg | DELAYED_RELEASE_TABLET | Freq: Every morning | ORAL | 3 refills | Status: DC

## 2023-06-02 MED ORDER — EZETIMIBE 10 MG PO TABS
10.0000 mg | ORAL_TABLET | Freq: Every day | ORAL | 3 refills | Status: DC
  Filled 2023-06-02: qty 90, 90d supply, fill #0
  Filled 2024-01-12: qty 90, 90d supply, fill #1

## 2023-06-05 DIAGNOSIS — M25562 Pain in left knee: Secondary | ICD-10-CM | POA: Diagnosis not present

## 2023-06-05 DIAGNOSIS — G8929 Other chronic pain: Secondary | ICD-10-CM | POA: Diagnosis not present

## 2023-07-15 ENCOUNTER — Other Ambulatory Visit (HOSPITAL_COMMUNITY): Payer: Self-pay

## 2023-07-15 MED ORDER — MOUNJARO 7.5 MG/0.5ML ~~LOC~~ SOAJ
7.5000 mg | SUBCUTANEOUS | 3 refills | Status: DC
  Filled 2023-07-15: qty 2, 28d supply, fill #0

## 2023-08-14 ENCOUNTER — Other Ambulatory Visit: Payer: Self-pay

## 2023-08-14 ENCOUNTER — Other Ambulatory Visit (HOSPITAL_COMMUNITY): Payer: Self-pay

## 2023-08-27 ENCOUNTER — Other Ambulatory Visit (HOSPITAL_COMMUNITY): Payer: Self-pay

## 2023-08-27 DIAGNOSIS — B372 Candidiasis of skin and nail: Secondary | ICD-10-CM | POA: Diagnosis not present

## 2023-08-27 DIAGNOSIS — Z01419 Encounter for gynecological examination (general) (routine) without abnormal findings: Secondary | ICD-10-CM | POA: Diagnosis not present

## 2023-08-27 DIAGNOSIS — Z124 Encounter for screening for malignant neoplasm of cervix: Secondary | ICD-10-CM | POA: Diagnosis not present

## 2023-08-27 MED ORDER — NYSTATIN 100000 UNIT/GM EX POWD
1.0000 | Freq: Two times a day (BID) | CUTANEOUS | 6 refills | Status: AC
  Filled 2023-08-27: qty 60, 30d supply, fill #0
  Filled 2023-11-13: qty 60, 30d supply, fill #1
  Filled 2024-04-02: qty 60, 30d supply, fill #2
  Filled 2024-07-29: qty 60, 30d supply, fill #3

## 2023-08-29 ENCOUNTER — Other Ambulatory Visit: Payer: Self-pay

## 2023-08-29 ENCOUNTER — Other Ambulatory Visit (HOSPITAL_COMMUNITY): Payer: Self-pay

## 2023-09-12 ENCOUNTER — Other Ambulatory Visit (HOSPITAL_COMMUNITY): Payer: Self-pay

## 2023-09-15 ENCOUNTER — Other Ambulatory Visit (HOSPITAL_COMMUNITY): Payer: Self-pay

## 2023-09-17 ENCOUNTER — Other Ambulatory Visit: Payer: Self-pay

## 2023-09-17 ENCOUNTER — Other Ambulatory Visit (HOSPITAL_COMMUNITY): Payer: Self-pay

## 2023-09-22 ENCOUNTER — Other Ambulatory Visit (HOSPITAL_COMMUNITY): Payer: Self-pay

## 2023-09-22 DIAGNOSIS — M7989 Other specified soft tissue disorders: Secondary | ICD-10-CM | POA: Diagnosis not present

## 2023-09-22 DIAGNOSIS — M79671 Pain in right foot: Secondary | ICD-10-CM | POA: Diagnosis not present

## 2023-09-22 MED ORDER — TRAMADOL HCL 50 MG PO TABS
50.0000 mg | ORAL_TABLET | Freq: Three times a day (TID) | ORAL | 0 refills | Status: DC | PRN
  Filled 2023-09-22: qty 40, 14d supply, fill #0

## 2023-09-25 ENCOUNTER — Encounter (HOSPITAL_COMMUNITY): Payer: Self-pay

## 2023-09-26 ENCOUNTER — Other Ambulatory Visit (HOSPITAL_COMMUNITY): Payer: Self-pay

## 2023-09-26 ENCOUNTER — Ambulatory Visit: Payer: Commercial Managed Care - PPO | Admitting: Podiatry

## 2023-09-26 DIAGNOSIS — M76821 Posterior tibial tendinitis, right leg: Secondary | ICD-10-CM | POA: Diagnosis not present

## 2023-09-26 NOTE — Progress Notes (Unsigned)
Subjective:  Patient ID: Stacey Clark, female    DOB: 06-01-1971,  MRN: [id number]  Chief Complaint  Patient presents with   Foot Pain    52 y.o. female presents with the above complaint.  Patient presents with complaint of right medial foot pain hurts with ambulation shoe pressure she has not seen and was prior to seeing me.  She states that this came out of nowhere and hurts with every step that she takes.  She denies any injury pain scale is 5 out of 10 dull aching nature   Review of Systems: Negative except as noted in the HPI. Denies N/V/F/Ch.  Past Medical History:  Diagnosis Date   Anxiety    Asthma    Back pain    Complication of anesthesia    Depression    Diabetes mellitus without complication (HCC)    GERD (gastroesophageal reflux disease)    History of bronchitis    HLD (hyperlipidemia)    IBS (irritable bowel syndrome)    Joint pain    Obesity    Pneumonia    PONV (postoperative nausea and vomiting)    Sleep apnea    uses CPAP machine    Current Outpatient Medications:    acetaminophen-codeine (TYLENOL #3) 300-30 MG tablet, Take 1 tablet by mouth every 4-6 hours as needed for pain, Disp: 16 tablet, Rfl: 0   albuterol (VENTOLIN HFA) 108 (90 Base) MCG/ACT inhaler, INHALE 2 PUFFS INTO THE LUNGS EVERY 4 (FOUR) HOURS AS NEEDED FOR WHEEZING., Disp: 18 g, Rfl: 2   albuterol (VENTOLIN HFA) 108 (90 Base) MCG/ACT inhaler, Inhale 2 puffs into the lungs every 4 (four) hours as needed for wheezing, Disp: 6.7 g, Rfl: 2   albuterol (VENTOLIN HFA) 108 (90 Base) MCG/ACT inhaler, Inhale 2 puffs into the lungs every 4 (four) hours as needed for wheezing, Disp: 6.7 g, Rfl: 2   ALPRAZolam (XANAX) 0.5 MG tablet, Take 0.5 mg by mouth 2 (two) times daily. , Disp: , Rfl:    ALPRAZolam (XANAX) 0.5 MG tablet, Take 1 tablet (0.5 mg total) by mouth 2 (two) times daily., Disp: 180 tablet, Rfl: 1   ALPRAZolam (XANAX) 0.5 MG tablet, Take 1 tablet (0.5 mg total) by mouth 2 (two) times daily.,  Disp: 180 tablet, Rfl: 1   azithromycin (ZITHROMAX) 500 MG tablet, Take 1 tablet (500 mg total) by mouth daily for 5 days., Disp: 5 tablet, Rfl: 0   benzonatate (TESSALON) 100 MG capsule, Take 1 capsule (100 mg total) by mouth 3 (three) times daily as needed for up to 7 days for cough., Disp: 20 capsule, Rfl: 0   Blood Pressure Monitoring (OMRON 3 SERIES BP MONITOR) DEVI, Use as directed, Disp: 1 each, Rfl: 0   buPROPion (WELLBUTRIN SR) 150 MG 12 hr tablet, Take 1 tablet (150 mg total) by mouth 2 (two) times daily., Disp: 60 tablet, Rfl: 0   buPROPion (WELLBUTRIN SR) 150 MG 12 hr tablet, TAKE 1 TABLET BY MOUTH TWICE A DAY, Disp: 180 tablet, Rfl: 3   buPROPion (WELLBUTRIN SR) 150 MG 12 hr tablet, Take 1 tablet (150 mg total) by mouth 2 (two) times daily., Disp: 180 tablet, Rfl: 3   buPROPion (WELLBUTRIN SR) 150 MG 12 hr tablet, Take 1 tablet (150 mg total) by mouth 2 (two) times daily., Disp: 180 tablet, Rfl: 3   buPROPion (WELLBUTRIN SR) 150 MG 12 hr tablet, Take 1 tablet (150 mg total) by mouth 2 (two) times daily., Disp: 180 tablet, Rfl: 3  celecoxib (CELEBREX) 200 MG capsule, Take 1 capsule (200 mg total) by mouth 2 (two) times daily., Disp: 60 capsule, Rfl: 2   chlorhexidine (PERIDEX) 0.12 % solution, Rinse with 1/2 oz twice daily-after breakfast and before bedtime, Disp: 473 mL, Rfl: 0   Cholecalciferol (D3-50) 1.25 MG (50000 UT) capsule, Take 1 capsule (50,000 Units total) by mouth every 7 (seven) days., Disp: 30 capsule, Rfl: 0   Cholecalciferol 1.25 MG (50000 UT) capsule, Take 1 capsule (50,000 Units total) by mouth once a week., Disp: 30 capsule, Rfl: 0   ciprofloxacin (CIPRO) 500 MG tablet, Take 1 tablet (500 mg total) by mouth 2 times daily for 7 days., Disp: 14 tablet, Rfl: 0   clindamycin (CLEOCIN) 150 MG capsule, Take 2 capsules now then 1 capsule 3 times a day for dental infection, Disp: 21 capsule, Rfl: 1   diclofenac Sodium (VOLTAREN) 1 % GEL, Apply a nickel sized amount to the knee  up to every 6 hours as needed.  Wash hands thoroughly after use., Disp: 100 g, Rfl: 0   doxycycline (VIBRAMYCIN) 100 MG capsule, Take 1 capsule (100 mg total) by mouth 2 (two) times daily for 10 days., Disp: 20 capsule, Rfl: 0   doxycycline (VIBRAMYCIN) 100 MG capsule, Take 1 capsule (100 mg total) by mouth 2 (two) times daily for 10 days, Disp: 20 capsule, Rfl: 0   Dulaglutide (TRULICITY) 0.75 MG/0.5ML SOPN, Inject 0.5 mLs (0.75 mg total) into the skin once a week., Disp: 8 mL, Rfl: 3   Dulaglutide (TRULICITY) 1.5 MG/0.5ML SOPN, Inject 1.5 mg into the skin every 7 (seven) days., Disp: 6 mL, Rfl: 1   ergocalciferol (VITAMIN D2) 1.25 MG (50000 UT) capsule, TAKE 1 CAPSULE BY MOUTH ONCE A WEEK., Disp: 30 capsule, Rfl: 0   escitalopram (LEXAPRO) 20 MG tablet, Take 20 mg by mouth at bedtime. , Disp: , Rfl:    escitalopram (LEXAPRO) 20 MG tablet, TAKE 1 TABLET (20 MG TOTAL) BY MOUTH DAILY., Disp: 90 tablet, Rfl: 3   escitalopram (LEXAPRO) 20 MG tablet, Take 1 tablet (20 mg total) by mouth daily., Disp: 90 tablet, Rfl: 3   escitalopram (LEXAPRO) 20 MG tablet, Take 1 tablet (20 mg total) by mouth daily., Disp: 90 tablet, Rfl: 3   ezetimibe (ZETIA) 10 MG tablet, Take 1 tablet (10 mg total) by mouth daily., Disp: 90 tablet, Rfl: 3   ezetimibe (ZETIA) 10 MG tablet, Take 1 tablet (10 mg total) by mouth daily., Disp: 90 tablet, Rfl: 3   famotidine (PEPCID) 40 MG tablet, Take 1 tablet (40 mg total) by mouth at bedtime., Disp: 90 tablet, Rfl: 3   glucose blood (FREESTYLE LITE) test strip, USE TO CHECK BLOOD SUGAR DAILY, Disp: 100 strip, Rfl: 3   glucose blood (FREESTYLE LITE) test strip, Use 1 to check blood sugar daily., Disp: 100 strip, Rfl: 3   glucose blood (FREESTYLE LITE) test strip, USE TO CHECK BLOOD SUGAR DAILY, Disp: 100 strip, Rfl: 3   ibuprofen (ADVIL) 600 MG tablet, Take 1 tablet (600 mg total) by mouth every 8 (eight) hours as needed with food. This is for pain and inflammation after surgery., Disp:  30 tablet, Rfl: 0   ibuprofen (ADVIL) 800 MG tablet, Take 1 tablet (800 mg total) by mouth every 8 (eight) hours as needed. (Patient not taking: Reported on 11/27/2020), Disp: 30 tablet, Rfl: 0   ibuprofen (ADVIL) 800 MG tablet, Take 1 tablet (800 mg total) by mouth every 6-8 hours as needed., Disp: 21 tablet,  Rfl: 1   Lancets (FREESTYLE) lancets, Use as directed daily, Disp: 200 each, Rfl: 3   Loteprednol Etabonate (LOTEMAX SM) 0.38 % GEL, Place 1 drop into both eyes 3 (three) times daily as directed for 7 days., Disp: 5 g, Rfl: 1   meloxicam (MOBIC) 15 MG tablet, 1 po q d x 3 weeks (Patient taking differently: Take 15 mg by mouth daily as needed (siaka nerve pain).), Disp: 30 tablet, Rfl: 0   meloxicam (MOBIC) 7.5 MG tablet, Take 1 tablet (7.5 mg total) by mouth 2 (two) times daily for 2 weeks, then take 1 tablet twice daily as needed., Disp: 60 tablet, Rfl: 0   metroNIDAZOLE (FLAGYL) 500 MG tablet, Take 0.5 tablets (250 mg total) by mouth 3 (three) times daily for 7 days., Disp: 11 tablet, Rfl: 0   nystatin (MYCOSTATIN/NYSTOP) powder, Apply 1 Application topically 2 (two) times daily., Disp: 60 g, Rfl: 6   nystatin cream (MYCOSTATIN), Apply 1 Application topically 2 (two) times daily as needed for 14 days, Disp: 30 g, Rfl: 0   ondansetron (ZOFRAN) 4 MG tablet, Take 1 tablet (4 mg total) by mouth every 8 (eight) hours as needed for up to 7 days for Nausea / Vomiting., Disp: 20 tablet, Rfl: 0   ondansetron (ZOFRAN) 4 MG tablet, Take 1 tablet (4 mg total) by mouth every 8 (eight) hours as needed for up to 7 days for Nausea / Vomiting., Disp: 20 tablet, Rfl: 0   oseltamivir (TAMIFLU) 75 MG capsule, Take 1 capsule (75 mg total) by mouth 2 times daily for 5 days., Disp: 10 capsule, Rfl: 0   oxyCODONE (OXY IR/ROXICODONE) 5 MG immediate release tablet, Take 1 tablet (5 mg total) by mouth every 4 (four) hours as needed for breakthrough pain after surgery., Disp: 10 tablet, Rfl: 0   pantoprazole (PROTONIX) 20  MG tablet, Take 1 tablet (20 mg total) by mouth daily., Disp: 30 tablet, Rfl: 0   pantoprazole (PROTONIX) 20 MG tablet, Take 1 tablet (20 mg total) by mouth daily., Disp: 90 tablet, Rfl: 3   pantoprazole (PROTONIX) 40 MG tablet, Take 1 tablet (40 mg total) by mouth in the morning., Disp: 90 tablet, Rfl: 3   pantoprazole (PROTONIX) 40 MG tablet, Take 1 tablet (40 mg total) by mouth in the morning., Disp: 90 tablet, Rfl: 3   pantoprazole (PROTONIX) 40 MG tablet, Take 1 tablet (40 mg total) by mouth in the morning., Disp: 90 tablet, Rfl: 3   pravastatin (PRAVACHOL) 20 MG tablet, Take 1 tablet (20 mg total) by mouth at bedtime., Disp: 90 tablet, Rfl: 3   predniSONE (DELTASONE) 20 MG tablet, Take 1 tablet (20 mg total) by mouth 2 (two) times daily for 5 days, Disp: 10 tablet, Rfl: 0   pseudoephedrine (SUDAFED) 30 MG tablet, Take 1-2 tablets by mouth every 4-6 hours as needed for congestion., Disp: 30 tablet, Rfl: 0   rOPINIRole (REQUIP) 0.5 MG tablet, Take 0.5 mg by mouth daily as needed (Restless legs). , Disp: , Rfl:    rOPINIRole (REQUIP) 0.5 MG tablet, TAKE 1 TABLET (0.5 MG TOTAL) BY MOUTH AT BEDTIME., Disp: 90 tablet, Rfl: 3   rOPINIRole (REQUIP) 0.5 MG tablet, Take 1 tablet (0.5 mg total) by mouth at bedtime., Disp: 90 tablet, Rfl: 3   rOPINIRole (REQUIP) 0.5 MG tablet, Take 1 tablet (0.5 mg total) by mouth at bedtime., Disp: 90 tablet, Rfl: 3   Semaglutide, 2 MG/DOSE, (OZEMPIC, 2 MG/DOSE,) 8 MG/3ML SOPN, Inject 2 mg into the  skin once a week., Disp: 9 mL, Rfl: 3   tirzepatide (MOUNJARO) 15 MG/0.5ML Pen, Inject 15 mg into the skin once a week., Disp: 2 mL, Rfl: 3   tirzepatide (MOUNJARO) 15 MG/0.5ML Pen, Inject 15 mg into the skin once a week., Disp: 2 mL, Rfl: 3   tirzepatide (MOUNJARO) 5 MG/0.5ML Pen, Inject 5 mg into the skin every 7 days, Disp: 2 mL, Rfl: 1   tirzepatide (MOUNJARO) 7.5 MG/0.5ML Pen, Inject 7.5 mg into the skin once a week., Disp: 2 mL, Rfl: 1   tirzepatide (MOUNJARO) 7.5  MG/0.5ML Pen, Inject 7.5 mg into the skin once a week., Disp: 2 mL, Rfl: 3   traMADol (ULTRAM) 50 MG tablet, Take 1-2 tablets (50-100 mg total) by mouth every 6 (six) hours as needed for up to 5 days, Disp: 40 tablet, Rfl: 1   traMADol (ULTRAM) 50 MG tablet, Take 1 tablet (50 mg total) by mouth every 8 (eight) hours as needed for moderate pain (4-6), Disp: 40 tablet, Rfl: 0   Vitamin D, Ergocalciferol, (DRISDOL) 1.25 MG (50000 UNIT) CAPS capsule, Take 50,000 Units by mouth every 7 (seven) days. Sunday, Disp: , Rfl:    Vitamin D, Ergocalciferol, (DRISDOL) 1.25 MG (50000 UNIT) CAPS capsule, Take 1 capsule (50,000 Units total) by mouth once a week., Disp: 30 capsule, Rfl: 0  Social History   Tobacco Use  Smoking Status Former   Current packs/day: 0.00   Types: Cigarettes   Quit date: 12/30/2006   Years since quitting: 16.7  Smokeless Tobacco Never    Allergies  Allergen Reactions   Cephalexin Swelling   Hydrocodone Nausea And Vomiting   Rifampin Nausea And Vomiting, Rash and Other (See Comments)    Nausea, Vomiting   Sulfa Antibiotics Hives   Imitrex [Sumatriptan] Nausea And Vomiting   Augmentin [Amoxicillin-Pot Clavulanate] Nausea And Vomiting   Metformin And Related Other (See Comments)    GI upset   Penicillins Swelling    Reaction: 2018   Objective:  There were no vitals filed for this visit. There is no height or weight on file to calculate BMI. Constitutional Well developed. Well nourished.  Vascular Dorsalis pedis pulses palpable bilaterally. Posterior tibial pulses palpable bilaterally. Capillary refill normal to all digits.  No cyanosis or clubbing noted. Pedal hair growth normal.  Neurologic Normal speech. Oriented to person, place, and time. Epicritic sensation to light touch grossly present bilaterally.  Dermatologic Nails well groomed and normal in appearance. No open wounds. No skin lesions.  Orthopedic: Pain along the course of the posterior tibial tendon  including the insertion pain with resisted plantarflexion inversion of the foot no pain with dorsiflexion eversion of the foot.   Radiographs: None Assessment:   1. Posterior tibial tendinitis, right    Plan:  Patient was evaluated and treated and all questions answered.  Right posterior tibial tendinitis -All questions and concerns were discussed with the patient extensive detail given the amount of pain that she is having she will benefit from cam boot immobilization.  Patient agrees with plan like to proceed with cam boot immobilization Cam boot was dispensed. -If there is no improvement we discussed her injection versus MRI  No follow-ups on file.

## 2023-10-03 ENCOUNTER — Ambulatory Visit: Payer: Commercial Managed Care - PPO | Admitting: Podiatry

## 2023-10-06 ENCOUNTER — Encounter: Payer: Self-pay | Admitting: Podiatry

## 2023-10-07 ENCOUNTER — Other Ambulatory Visit (HOSPITAL_COMMUNITY): Payer: Self-pay

## 2023-10-13 ENCOUNTER — Other Ambulatory Visit (HOSPITAL_COMMUNITY): Payer: Self-pay

## 2023-10-13 ENCOUNTER — Other Ambulatory Visit (HOSPITAL_BASED_OUTPATIENT_CLINIC_OR_DEPARTMENT_OTHER): Payer: Self-pay

## 2023-10-14 ENCOUNTER — Other Ambulatory Visit (HOSPITAL_BASED_OUTPATIENT_CLINIC_OR_DEPARTMENT_OTHER): Payer: Self-pay

## 2023-10-15 ENCOUNTER — Other Ambulatory Visit (HOSPITAL_COMMUNITY): Payer: Self-pay

## 2023-10-15 ENCOUNTER — Other Ambulatory Visit: Payer: Self-pay

## 2023-10-15 ENCOUNTER — Encounter (HOSPITAL_COMMUNITY): Payer: Self-pay

## 2023-10-23 ENCOUNTER — Encounter: Payer: Self-pay | Admitting: Podiatry

## 2023-10-23 ENCOUNTER — Other Ambulatory Visit (HOSPITAL_COMMUNITY): Payer: Self-pay

## 2023-10-29 ENCOUNTER — Ambulatory Visit: Payer: Commercial Managed Care - PPO | Admitting: Podiatry

## 2023-10-30 ENCOUNTER — Ambulatory Visit: Payer: Commercial Managed Care - PPO | Admitting: Podiatry

## 2023-10-30 ENCOUNTER — Encounter: Payer: Self-pay | Admitting: Podiatry

## 2023-10-30 VITALS — Ht 60.0 in | Wt 276.0 lb

## 2023-10-30 DIAGNOSIS — M76821 Posterior tibial tendinitis, right leg: Secondary | ICD-10-CM | POA: Diagnosis not present

## 2023-10-30 DIAGNOSIS — Q666 Other congenital valgus deformities of feet: Secondary | ICD-10-CM | POA: Diagnosis not present

## 2023-10-30 NOTE — Progress Notes (Signed)
Subjective:  Patient ID: Stacey Clark, female    DOB: 1971-08-14,  MRN: [id number]  Chief Complaint  Patient presents with   Foot Pain    Patient is here for 4 wk follow up for right foot tendonitis     52 y.o. female presents with the above complaint.  Patient presents for follow-up of right posterior tibial tendinitis.  She states she is doing little bit better cam boot helped.  She is here today to discuss next treatment plan she still has some residual pain.   Review of Systems: Negative except as noted in the HPI. Denies N/V/F/Ch.  Past Medical History:  Diagnosis Date   Anxiety    Asthma    Back pain    Complication of anesthesia    Depression    Diabetes mellitus without complication (HCC)    GERD (gastroesophageal reflux disease)    History of bronchitis    HLD (hyperlipidemia)    IBS (irritable bowel syndrome)    Joint pain    Obesity    Pneumonia    PONV (postoperative nausea and vomiting)    Sleep apnea    uses CPAP machine    Current Outpatient Medications:    albuterol (VENTOLIN HFA) 108 (90 Base) MCG/ACT inhaler, Inhale 2 puffs into the lungs every 4 (four) hours as needed for wheezing, Disp: 6.7 g, Rfl: 2   albuterol (VENTOLIN HFA) 108 (90 Base) MCG/ACT inhaler, Inhale 2 puffs into the lungs every 4 (four) hours as needed for wheezing, Disp: 6.7 g, Rfl: 2   ALPRAZolam (XANAX) 0.5 MG tablet, Take 0.5 mg by mouth 2 (two) times daily. , Disp: , Rfl:    ALPRAZolam (XANAX) 0.5 MG tablet, Take 1 tablet (0.5 mg total) by mouth 2 (two) times daily., Disp: 180 tablet, Rfl: 1   ALPRAZolam (XANAX) 0.5 MG tablet, Take 1 tablet (0.5 mg total) by mouth 2 (two) times daily., Disp: 180 tablet, Rfl: 1   Blood Pressure Monitoring (OMRON 3 SERIES BP MONITOR) DEVI, Use as directed, Disp: 1 each, Rfl: 0   buPROPion (WELLBUTRIN SR) 150 MG 12 hr tablet, Take 1 tablet (150 mg total) by mouth 2 (two) times daily., Disp: 60 tablet, Rfl: 0   buPROPion (WELLBUTRIN SR) 150 MG 12 hr  tablet, Take 1 tablet (150 mg total) by mouth 2 (two) times daily., Disp: 180 tablet, Rfl: 3   buPROPion (WELLBUTRIN SR) 150 MG 12 hr tablet, Take 1 tablet (150 mg total) by mouth 2 (two) times daily., Disp: 180 tablet, Rfl: 3   buPROPion (WELLBUTRIN SR) 150 MG 12 hr tablet, Take 1 tablet (150 mg total) by mouth 2 (two) times daily., Disp: 180 tablet, Rfl: 3   celecoxib (CELEBREX) 200 MG capsule, Take 1 capsule (200 mg total) by mouth 2 (two) times daily., Disp: 60 capsule, Rfl: 2   Cholecalciferol (D3-50) 1.25 MG (50000 UT) capsule, Take 1 capsule (50,000 Units total) by mouth every 7 (seven) days., Disp: 30 capsule, Rfl: 0   Cholecalciferol 1.25 MG (50000 UT) capsule, Take 1 capsule (50,000 Units total) by mouth once a week., Disp: 30 capsule, Rfl: 0   ciprofloxacin (CIPRO) 500 MG tablet, Take 1 tablet (500 mg total) by mouth 2 times daily for 7 days., Disp: 14 tablet, Rfl: 0   clindamycin (CLEOCIN) 150 MG capsule, Take 2 capsules now then 1 capsule 3 times a day for dental infection, Disp: 21 capsule, Rfl: 1   diclofenac Sodium (VOLTAREN) 1 % GEL, Apply a nickel sized  amount to the knee up to every 6 hours as needed.  Wash hands thoroughly after use., Disp: 100 g, Rfl: 0   doxycycline (VIBRAMYCIN) 100 MG capsule, Take 1 capsule (100 mg total) by mouth 2 (two) times daily for 10 days., Disp: 20 capsule, Rfl: 0   doxycycline (VIBRAMYCIN) 100 MG capsule, Take 1 capsule (100 mg total) by mouth 2 (two) times daily for 10 days, Disp: 20 capsule, Rfl: 0   Dulaglutide (TRULICITY) 1.5 MG/0.5ML SOPN, Inject 1.5 mg into the skin every 7 (seven) days., Disp: 6 mL, Rfl: 1   ergocalciferol (VITAMIN D2) 1.25 MG (50000 UT) capsule, TAKE 1 CAPSULE BY MOUTH ONCE A WEEK., Disp: 30 capsule, Rfl: 0   escitalopram (LEXAPRO) 20 MG tablet, Take 20 mg by mouth at bedtime. , Disp: , Rfl:    escitalopram (LEXAPRO) 20 MG tablet, Take 1 tablet (20 mg total) by mouth daily., Disp: 90 tablet, Rfl: 3   escitalopram (LEXAPRO) 20  MG tablet, Take 1 tablet (20 mg total) by mouth daily., Disp: 90 tablet, Rfl: 3   ezetimibe (ZETIA) 10 MG tablet, Take 1 tablet (10 mg total) by mouth daily., Disp: 90 tablet, Rfl: 3   ezetimibe (ZETIA) 10 MG tablet, Take 1 tablet (10 mg total) by mouth daily., Disp: 90 tablet, Rfl: 3   famotidine (PEPCID) 40 MG tablet, Take 1 tablet (40 mg total) by mouth at bedtime., Disp: 90 tablet, Rfl: 3   glucose blood (FREESTYLE LITE) test strip, USE TO CHECK BLOOD SUGAR DAILY, Disp: 100 strip, Rfl: 3   glucose blood (FREESTYLE LITE) test strip, Use 1 to check blood sugar daily., Disp: 100 strip, Rfl: 3   glucose blood (FREESTYLE LITE) test strip, USE TO CHECK BLOOD SUGAR DAILY, Disp: 100 strip, Rfl: 3   ibuprofen (ADVIL) 600 MG tablet, Take 1 tablet (600 mg total) by mouth every 8 (eight) hours as needed with food. This is for pain and inflammation after surgery., Disp: 30 tablet, Rfl: 0   ibuprofen (ADVIL) 800 MG tablet, Take 1 tablet (800 mg total) by mouth every 6-8 hours as needed., Disp: 21 tablet, Rfl: 1   Lancets (FREESTYLE) lancets, Use as directed daily, Disp: 200 each, Rfl: 3   meloxicam (MOBIC) 15 MG tablet, 1 po q d x 3 weeks (Patient taking differently: Take 15 mg by mouth daily as needed (siaka nerve pain).), Disp: 30 tablet, Rfl: 0   meloxicam (MOBIC) 7.5 MG tablet, Take 1 tablet (7.5 mg total) by mouth 2 (two) times daily for 2 weeks, then take 1 tablet twice daily as needed., Disp: 60 tablet, Rfl: 0   metroNIDAZOLE (FLAGYL) 500 MG tablet, Take 0.5 tablets (250 mg total) by mouth 3 (three) times daily for 7 days., Disp: 11 tablet, Rfl: 0   nystatin (MYCOSTATIN/NYSTOP) powder, Apply 1 Application topically 2 (two) times daily., Disp: 60 g, Rfl: 6   nystatin cream (MYCOSTATIN), Apply 1 Application topically 2 (two) times daily as needed for 14 days, Disp: 30 g, Rfl: 0   ondansetron (ZOFRAN) 4 MG tablet, Take 1 tablet (4 mg total) by mouth every 8 (eight) hours as needed for up to 7 days for  Nausea / Vomiting., Disp: 20 tablet, Rfl: 0   ondansetron (ZOFRAN) 4 MG tablet, Take 1 tablet (4 mg total) by mouth every 8 (eight) hours as needed for up to 7 days for Nausea / Vomiting., Disp: 20 tablet, Rfl: 0   oseltamivir (TAMIFLU) 75 MG capsule, Take 1 capsule (75 mg total)  by mouth 2 times daily for 5 days., Disp: 10 capsule, Rfl: 0   oxyCODONE (OXY IR/ROXICODONE) 5 MG immediate release tablet, Take 1 tablet (5 mg total) by mouth every 4 (four) hours as needed for breakthrough pain after surgery., Disp: 10 tablet, Rfl: 0   pantoprazole (PROTONIX) 20 MG tablet, Take 1 tablet (20 mg total) by mouth daily., Disp: 30 tablet, Rfl: 0   pantoprazole (PROTONIX) 20 MG tablet, Take 1 tablet (20 mg total) by mouth daily., Disp: 90 tablet, Rfl: 3   pantoprazole (PROTONIX) 40 MG tablet, Take 1 tablet (40 mg total) by mouth in the morning., Disp: 90 tablet, Rfl: 3   pantoprazole (PROTONIX) 40 MG tablet, Take 1 tablet (40 mg total) by mouth in the morning., Disp: 90 tablet, Rfl: 3   pantoprazole (PROTONIX) 40 MG tablet, Take 1 tablet (40 mg total) by mouth in the morning., Disp: 90 tablet, Rfl: 3   pravastatin (PRAVACHOL) 20 MG tablet, Take 1 tablet (20 mg total) by mouth at bedtime., Disp: 90 tablet, Rfl: 3   predniSONE (DELTASONE) 20 MG tablet, Take 1 tablet (20 mg total) by mouth 2 (two) times daily for 5 days, Disp: 10 tablet, Rfl: 0   pseudoephedrine (SUDAFED) 30 MG tablet, Take 1-2 tablets by mouth every 4-6 hours as needed for congestion., Disp: 30 tablet, Rfl: 0   rOPINIRole (REQUIP) 0.5 MG tablet, Take 0.5 mg by mouth daily as needed (Restless legs). , Disp: , Rfl:    rOPINIRole (REQUIP) 0.5 MG tablet, Take 1 tablet (0.5 mg total) by mouth at bedtime., Disp: 90 tablet, Rfl: 3   rOPINIRole (REQUIP) 0.5 MG tablet, Take 1 tablet (0.5 mg total) by mouth at bedtime., Disp: 90 tablet, Rfl: 3   Semaglutide, 2 MG/DOSE, (OZEMPIC, 2 MG/DOSE,) 8 MG/3ML SOPN, Inject 2 mg into the skin once a week., Disp: 9 mL,  Rfl: 3   tirzepatide (MOUNJARO) 15 MG/0.5ML Pen, Inject 15 mg into the skin once a week., Disp: 2 mL, Rfl: 3   tirzepatide (MOUNJARO) 15 MG/0.5ML Pen, Inject 15 mg into the skin once a week., Disp: 2 mL, Rfl: 3   tirzepatide (MOUNJARO) 5 MG/0.5ML Pen, Inject 5 mg into the skin every 7 days, Disp: 2 mL, Rfl: 1   tirzepatide (MOUNJARO) 7.5 MG/0.5ML Pen, Inject 7.5 mg into the skin once a week., Disp: 2 mL, Rfl: 1   tirzepatide (MOUNJARO) 7.5 MG/0.5ML Pen, Inject 7.5 mg into the skin once a week., Disp: 2 mL, Rfl: 3   traMADol (ULTRAM) 50 MG tablet, Take 1-2 tablets (50-100 mg total) by mouth every 6 (six) hours as needed for up to 5 days, Disp: 40 tablet, Rfl: 1   traMADol (ULTRAM) 50 MG tablet, Take 1 tablet (50 mg total) by mouth every 8 (eight) hours as needed for moderate pain (4-6), Disp: 40 tablet, Rfl: 0   Vitamin D, Ergocalciferol, (DRISDOL) 1.25 MG (50000 UNIT) CAPS capsule, Take 50,000 Units by mouth every 7 (seven) days. Sunday, Disp: , Rfl:    Vitamin D, Ergocalciferol, (DRISDOL) 1.25 MG (50000 UNIT) CAPS capsule, Take 1 capsule (50,000 Units total) by mouth once a week., Disp: 30 capsule, Rfl: 0   rOPINIRole (REQUIP) 0.5 MG tablet, TAKE 1 TABLET (0.5 MG TOTAL) BY MOUTH AT BEDTIME., Disp: 90 tablet, Rfl: 3  Social History   Tobacco Use  Smoking Status Former   Current packs/day: 0.00   Types: Cigarettes   Quit date: 12/30/2006   Years since quitting: 16.8  Smokeless Tobacco Never  Allergies  Allergen Reactions   Cephalexin Swelling   Hydrocodone Nausea And Vomiting   Rifampin Nausea And Vomiting, Rash and Other (See Comments)    Nausea, Vomiting   Sulfa Antibiotics Hives   Imitrex [Sumatriptan] Nausea And Vomiting   Augmentin [Amoxicillin-Pot Clavulanate] Nausea And Vomiting   Metformin And Related Other (See Comments)    GI upset   Penicillins Swelling    Reaction: 2018   Objective:  There were no vitals filed for this visit. Body mass index is 53.9  kg/m. Constitutional Well developed. Well nourished.  Vascular Dorsalis pedis pulses palpable bilaterally. Posterior tibial pulses palpable bilaterally. Capillary refill normal to all digits.  No cyanosis or clubbing noted. Pedal hair growth normal.  Neurologic Normal speech. Oriented to person, place, and time. Epicritic sensation to light touch grossly present bilaterally.  Dermatologic Nails well groomed and normal in appearance. No open wounds. No skin lesions.  Orthopedic: Pain along the course of the posterior tibial tendon including the insertion pain with resisted plantarflexion inversion of the foot no pain with dorsiflexion eversion of the foot.   Radiographs: None Assessment:   1. Posterior tibial tendinitis, right   2. Pes planovalgus     Plan:  Patient was evaluated and treated and all questions answered.  Right posterior tibial tendinitis -All questions and concerns were discussed with the patient extensive detail cam boot help decrease some of the pain that she was having she still has some residual pain she would benefit from a steroid injection I discussed the risk of rupture associate with this she states understand like to proceed -A steroid injection was performed at right medial foot at point of maximal tenderness using 1% plain Lidocaine and 10 mg of Kenalog. This was well tolerated. -Tri-Lock ankle brace was dispensed to help with transition out with a cam boot  Pes planovalgus -I explained to patient the etiology of pes planovalgus and relationship with posterior grade tendinitis and various treatment options were discussed.  Given patient foot structure in the setting of posterior tibial tendinitis I believe patient will benefit from custom-made orthotics to help control the hindfoot motion support the arch of the foot and take the stress away from plantar fascial.  Patient agrees with the plan like to proceed with orthotics -Patient was casted for  orthotics    No follow-ups on file.

## 2023-11-12 NOTE — Progress Notes (Unsigned)
Orthotic order placed to Stacey Clark will call for fitting when in  [place]

## 2023-11-13 ENCOUNTER — Other Ambulatory Visit (HOSPITAL_COMMUNITY): Payer: Self-pay

## 2023-11-13 MED ORDER — MOUNJARO 15 MG/0.5ML ~~LOC~~ SOAJ
15.0000 mg | SUBCUTANEOUS | 3 refills | Status: DC
  Filled 2023-11-13: qty 2, 28d supply, fill #0
  Filled 2023-12-15: qty 2, 28d supply, fill #1
  Filled 2024-01-05 – 2024-01-07 (×2): qty 2, 28d supply, fill #2
  Filled 2024-02-04: qty 2, 28d supply, fill #3

## 2023-11-14 ENCOUNTER — Other Ambulatory Visit: Payer: Self-pay

## 2023-11-18 ENCOUNTER — Encounter (HOSPITAL_COMMUNITY): Payer: Self-pay

## 2023-11-18 ENCOUNTER — Other Ambulatory Visit (HOSPITAL_COMMUNITY): Payer: Self-pay

## 2023-11-18 DIAGNOSIS — G4733 Obstructive sleep apnea (adult) (pediatric): Secondary | ICD-10-CM | POA: Diagnosis not present

## 2023-12-01 ENCOUNTER — Other Ambulatory Visit (HOSPITAL_COMMUNITY): Payer: Self-pay

## 2023-12-01 MED ORDER — VITAMIN D3 1.25 MG (50000 UT) PO CAPS
50000.0000 [IU] | ORAL_CAPSULE | ORAL | 0 refills | Status: AC
  Filled 2023-12-01: qty 12, 84d supply, fill #0
  Filled 2024-02-25: qty 12, 84d supply, fill #1
  Filled 2024-05-17: qty 6, 42d supply, fill #2

## 2023-12-03 ENCOUNTER — Ambulatory Visit: Payer: Commercial Managed Care - PPO | Admitting: Podiatry

## 2023-12-03 ENCOUNTER — Other Ambulatory Visit (HOSPITAL_COMMUNITY): Payer: Self-pay

## 2023-12-08 ENCOUNTER — Other Ambulatory Visit (HOSPITAL_COMMUNITY): Payer: Self-pay

## 2023-12-08 DIAGNOSIS — F064 Anxiety disorder due to known physiological condition: Secondary | ICD-10-CM | POA: Diagnosis not present

## 2023-12-08 DIAGNOSIS — Z0000 Encounter for general adult medical examination without abnormal findings: Secondary | ICD-10-CM | POA: Diagnosis not present

## 2023-12-08 MED ORDER — ALPRAZOLAM 0.5 MG PO TABS
0.5000 mg | ORAL_TABLET | Freq: Two times a day (BID) | ORAL | 1 refills | Status: DC
  Filled 2023-12-08 – 2024-04-02 (×2): qty 180, 90d supply, fill #0

## 2023-12-15 ENCOUNTER — Other Ambulatory Visit (HOSPITAL_COMMUNITY): Payer: Self-pay

## 2023-12-30 ENCOUNTER — Other Ambulatory Visit: Payer: Self-pay | Admitting: Family Medicine

## 2023-12-30 DIAGNOSIS — Z1231 Encounter for screening mammogram for malignant neoplasm of breast: Secondary | ICD-10-CM

## 2024-01-02 ENCOUNTER — Other Ambulatory Visit (HOSPITAL_COMMUNITY): Payer: Self-pay

## 2024-01-02 ENCOUNTER — Other Ambulatory Visit: Payer: Self-pay

## 2024-01-05 ENCOUNTER — Other Ambulatory Visit: Payer: Self-pay

## 2024-01-05 ENCOUNTER — Other Ambulatory Visit (HOSPITAL_COMMUNITY): Payer: Self-pay

## 2024-01-05 ENCOUNTER — Other Ambulatory Visit (HOSPITAL_BASED_OUTPATIENT_CLINIC_OR_DEPARTMENT_OTHER): Payer: Self-pay

## 2024-01-05 MED ORDER — ESCITALOPRAM OXALATE 10 MG PO TABS
10.0000 mg | ORAL_TABLET | Freq: Every day | ORAL | 0 refills | Status: DC
  Filled 2024-01-05: qty 30, 30d supply, fill #0

## 2024-01-06 ENCOUNTER — Other Ambulatory Visit (HOSPITAL_COMMUNITY): Payer: Self-pay

## 2024-01-06 ENCOUNTER — Other Ambulatory Visit (HOSPITAL_BASED_OUTPATIENT_CLINIC_OR_DEPARTMENT_OTHER): Payer: Self-pay

## 2024-01-08 ENCOUNTER — Ambulatory Visit: Payer: Commercial Managed Care - PPO

## 2024-01-08 ENCOUNTER — Other Ambulatory Visit (HOSPITAL_COMMUNITY): Payer: Self-pay

## 2024-01-08 NOTE — Progress Notes (Signed)
 Patient presents today to pick up custom molded foot orthotics, diagnosed with PTT by Dr. Tobie.   Orthotics were dispensed and fit was satisfactory. Reviewed instructions for break-in and wear. Written instructions given to patient.  Patient will follow up as needed.  Lolita Schultze CPed, CFo, CFm

## 2024-01-14 ENCOUNTER — Other Ambulatory Visit (HOSPITAL_COMMUNITY): Payer: Self-pay

## 2024-01-15 ENCOUNTER — Other Ambulatory Visit (HOSPITAL_BASED_OUTPATIENT_CLINIC_OR_DEPARTMENT_OTHER): Payer: Self-pay

## 2024-01-15 MED ORDER — AZITHROMYCIN 500 MG PO TABS
500.0000 mg | ORAL_TABLET | Freq: Every day | ORAL | 0 refills | Status: AC
  Filled 2024-01-15: qty 5, 5d supply, fill #0

## 2024-01-21 ENCOUNTER — Ambulatory Visit: Payer: Commercial Managed Care - PPO

## 2024-01-21 ENCOUNTER — Ambulatory Visit
Admission: RE | Admit: 2024-01-21 | Discharge: 2024-01-21 | Disposition: A | Discharge status: Home / Self Care | Payer: Commercial Managed Care - PPO | Source: Ambulatory Visit | Attending: Family Medicine | Admitting: Family Medicine

## 2024-01-21 DIAGNOSIS — Z1231 Encounter for screening mammogram for malignant neoplasm of breast: Secondary | ICD-10-CM

## 2024-01-22 DIAGNOSIS — Z0184 Encounter for antibody response examination: Secondary | ICD-10-CM | POA: Diagnosis not present

## 2024-01-29 DIAGNOSIS — G8929 Other chronic pain: Secondary | ICD-10-CM | POA: Diagnosis not present

## 2024-01-29 DIAGNOSIS — M1712 Unilateral primary osteoarthritis, left knee: Secondary | ICD-10-CM | POA: Diagnosis not present

## 2024-01-29 DIAGNOSIS — M25562 Pain in left knee: Secondary | ICD-10-CM | POA: Diagnosis not present

## 2024-02-04 ENCOUNTER — Other Ambulatory Visit (HOSPITAL_COMMUNITY): Payer: Self-pay

## 2024-02-04 ENCOUNTER — Other Ambulatory Visit (HOSPITAL_BASED_OUTPATIENT_CLINIC_OR_DEPARTMENT_OTHER): Payer: Self-pay

## 2024-02-06 ENCOUNTER — Other Ambulatory Visit (HOSPITAL_COMMUNITY): Payer: Self-pay

## 2024-02-06 MED ORDER — DESVENLAFAXINE SUCCINATE ER 50 MG PO TB24
50.0000 mg | ORAL_TABLET | Freq: Every day | ORAL | 3 refills | Status: DC
  Filled 2024-02-06: qty 90, 90d supply, fill #0
  Filled 2024-02-06: qty 10, 10d supply, fill #0
  Filled 2024-02-06: qty 80, 80d supply, fill #0
  Filled 2024-02-06: qty 90, 90d supply, fill #0

## 2024-02-13 DIAGNOSIS — M1712 Unilateral primary osteoarthritis, left knee: Secondary | ICD-10-CM | POA: Diagnosis not present

## 2024-02-25 ENCOUNTER — Other Ambulatory Visit (HOSPITAL_BASED_OUTPATIENT_CLINIC_OR_DEPARTMENT_OTHER): Payer: Self-pay

## 2024-02-25 ENCOUNTER — Ambulatory Visit: Payer: Commercial Managed Care - PPO | Admitting: Podiatry

## 2024-02-25 DIAGNOSIS — Q666 Other congenital valgus deformities of feet: Secondary | ICD-10-CM

## 2024-02-25 DIAGNOSIS — M76821 Posterior tibial tendinitis, right leg: Secondary | ICD-10-CM

## 2024-02-25 NOTE — Progress Notes (Signed)
 Subjective:  Patient ID: Stacey Clark, female    DOB: 22-Nov-1971,  MRN: [id number]  Chief Complaint  Patient presents with   Posterior tibial tendinitis, right    Pt stated that she is having some discomfort with her foot again.    53 y.o. female presents with the above complaint.  Patient presents for follow-up of right posterior tibial tendinitis.  She states the pain came back.  The last injection helped.  She would like to do another injection and discuss further treatment plan she has been wearing her orthotics denies any other acute complaints   Review of Systems: Negative except as noted in the HPI. Denies N/V/F/Ch.  Past Medical History:  Diagnosis Date   Anxiety    Asthma    Back pain    Complication of anesthesia    Depression    Diabetes mellitus without complication (HCC)    GERD (gastroesophageal reflux disease)    History of bronchitis    HLD (hyperlipidemia)    IBS (irritable bowel syndrome)    Joint pain    Obesity    Pneumonia    PONV (postoperative nausea and vomiting)    Sleep apnea    uses CPAP machine    Current Outpatient Medications:    albuterol (VENTOLIN HFA) 108 (90 Base) MCG/ACT inhaler, Inhale 2 puffs into the lungs every 4 (four) hours as needed for wheezing, Disp: 6.7 g, Rfl: 2   albuterol (VENTOLIN HFA) 108 (90 Base) MCG/ACT inhaler, Inhale 2 puffs into the lungs every 4 (four) hours as needed for wheezing, Disp: 6.7 g, Rfl: 2   ALPRAZolam (XANAX) 0.5 MG tablet, Take 0.5 mg by mouth 2 (two) times daily. , Disp: , Rfl:    ALPRAZolam (XANAX) 0.5 MG tablet, Take 1 tablet (0.5 mg total) by mouth 2 (two) times daily., Disp: 180 tablet, Rfl: 1   ALPRAZolam (XANAX) 0.5 MG tablet, Take 1 tablet (0.5 mg total) by mouth 2 (two) times daily., Disp: 180 tablet, Rfl: 1   azithromycin (ZITHROMAX) 500 MG tablet, Take 1 tablet (500 mg total) by mouth daily for 5 days., Disp: 5 tablet, Rfl: 0   Blood Pressure Monitoring (OMRON 3 SERIES BP MONITOR) DEVI, Use  as directed, Disp: 1 each, Rfl: 0   buPROPion (WELLBUTRIN SR) 150 MG 12 hr tablet, Take 1 tablet (150 mg total) by mouth 2 (two) times daily., Disp: 60 tablet, Rfl: 0   buPROPion (WELLBUTRIN SR) 150 MG 12 hr tablet, Take 1 tablet (150 mg total) by mouth 2 (two) times daily., Disp: 180 tablet, Rfl: 3   buPROPion (WELLBUTRIN SR) 150 MG 12 hr tablet, Take 1 tablet (150 mg total) by mouth 2 (two) times daily., Disp: 180 tablet, Rfl: 3   buPROPion (WELLBUTRIN SR) 150 MG 12 hr tablet, Take 1 tablet (150 mg total) by mouth 2 (two) times daily., Disp: 180 tablet, Rfl: 3   celecoxib (CELEBREX) 200 MG capsule, Take 1 capsule (200 mg total) by mouth 2 (two) times daily., Disp: 60 capsule, Rfl: 2   Cholecalciferol (D3-50) 1.25 MG (50000 UT) capsule, Take 1 capsule (50,000 Units total) by mouth every 7 (seven) days., Disp: 30 capsule, Rfl: 0   Cholecalciferol (VITAMIN D3) 1.25 MG (50000 UT) CAPS, Take 1 capsule (50,000 Units total) by mouth once a week., Disp: 30 capsule, Rfl: 0   ciprofloxacin (CIPRO) 500 MG tablet, Take 1 tablet (500 mg total) by mouth 2 times daily for 7 days., Disp: 14 tablet, Rfl: 0   clindamycin (  CLEOCIN) 150 MG capsule, Take 2 capsules now then 1 capsule 3 times a day for dental infection, Disp: 21 capsule, Rfl: 1   desvenlafaxine (PRISTIQ) 50 MG 24 hr tablet, Take 1 tablet (50 mg total) by mouth daily., Disp: 90 tablet, Rfl: 3   diclofenac Sodium (VOLTAREN) 1 % GEL, Apply a nickel sized amount to the knee up to every 6 hours as needed.  Wash hands thoroughly after use., Disp: 100 g, Rfl: 0   doxycycline (VIBRAMYCIN) 100 MG capsule, Take 1 capsule (100 mg total) by mouth 2 (two) times daily for 10 days., Disp: 20 capsule, Rfl: 0   doxycycline (VIBRAMYCIN) 100 MG capsule, Take 1 capsule (100 mg total) by mouth 2 (two) times daily for 10 days, Disp: 20 capsule, Rfl: 0   Dulaglutide (TRULICITY) 1.5 MG/0.5ML SOPN, Inject 1.5 mg into the skin every 7 (seven) days., Disp: 6 mL, Rfl: 1    ergocalciferol (VITAMIN D2) 1.25 MG (50000 UT) capsule, TAKE 1 CAPSULE BY MOUTH ONCE A WEEK., Disp: 30 capsule, Rfl: 0   escitalopram (LEXAPRO) 10 MG tablet, Take 1 tablet (10 mg total) by mouth daily., Disp: 30 tablet, Rfl: 0   escitalopram (LEXAPRO) 20 MG tablet, Take 20 mg by mouth at bedtime. , Disp: , Rfl:    escitalopram (LEXAPRO) 20 MG tablet, Take 1 tablet (20 mg total) by mouth daily., Disp: 90 tablet, Rfl: 3   ezetimibe (ZETIA) 10 MG tablet, Take 1 tablet (10 mg total) by mouth daily., Disp: 90 tablet, Rfl: 3   ezetimibe (ZETIA) 10 MG tablet, Take 1 tablet (10 mg total) by mouth daily., Disp: 90 tablet, Rfl: 3   famotidine (PEPCID) 40 MG tablet, Take 1 tablet (40 mg total) by mouth at bedtime., Disp: 90 tablet, Rfl: 3   glucose blood (FREESTYLE LITE) test strip, USE TO CHECK BLOOD SUGAR DAILY, Disp: 100 strip, Rfl: 3   glucose blood (FREESTYLE LITE) test strip, Use 1 to check blood sugar daily., Disp: 100 strip, Rfl: 3   glucose blood (FREESTYLE LITE) test strip, USE TO CHECK BLOOD SUGAR DAILY, Disp: 100 strip, Rfl: 3   ibuprofen (ADVIL) 600 MG tablet, Take 1 tablet (600 mg total) by mouth every 8 (eight) hours as needed with food. This is for pain and inflammation after surgery., Disp: 30 tablet, Rfl: 0   ibuprofen (ADVIL) 800 MG tablet, Take 1 tablet (800 mg total) by mouth every 6-8 hours as needed., Disp: 21 tablet, Rfl: 1   Lancets (FREESTYLE) lancets, Use as directed daily, Disp: 200 each, Rfl: 3   meloxicam (MOBIC) 15 MG tablet, 1 po q d x 3 weeks (Patient taking differently: Take 15 mg by mouth daily as needed (siaka nerve pain).), Disp: 30 tablet, Rfl: 0   meloxicam (MOBIC) 7.5 MG tablet, Take 1 tablet (7.5 mg total) by mouth 2 (two) times daily for 2 weeks, then take 1 tablet twice daily as needed., Disp: 60 tablet, Rfl: 0   metroNIDAZOLE (FLAGYL) 500 MG tablet, Take 0.5 tablets (250 mg total) by mouth 3 (three) times daily for 7 days., Disp: 11 tablet, Rfl: 0   nystatin  (MYCOSTATIN/NYSTOP) powder, Apply 1 Application topically 2 (two) times daily., Disp: 60 g, Rfl: 6   nystatin cream (MYCOSTATIN), Apply 1 Application topically 2 (two) times daily as needed for 14 days, Disp: 30 g, Rfl: 0   ondansetron (ZOFRAN) 4 MG tablet, Take 1 tablet (4 mg total) by mouth every 8 (eight) hours as needed for up to 7  days for Nausea / Vomiting., Disp: 20 tablet, Rfl: 0   ondansetron (ZOFRAN) 4 MG tablet, Take 1 tablet (4 mg total) by mouth every 8 (eight) hours as needed for up to 7 days for Nausea / Vomiting., Disp: 20 tablet, Rfl: 0   oseltamivir (TAMIFLU) 75 MG capsule, Take 1 capsule (75 mg total) by mouth 2 times daily for 5 days., Disp: 10 capsule, Rfl: 0   oxyCODONE (OXY IR/ROXICODONE) 5 MG immediate release tablet, Take 1 tablet (5 mg total) by mouth every 4 (four) hours as needed for breakthrough pain after surgery., Disp: 10 tablet, Rfl: 0   pantoprazole (PROTONIX) 20 MG tablet, Take 1 tablet (20 mg total) by mouth daily., Disp: 30 tablet, Rfl: 0   pantoprazole (PROTONIX) 20 MG tablet, Take 1 tablet (20 mg total) by mouth daily., Disp: 90 tablet, Rfl: 3   pantoprazole (PROTONIX) 40 MG tablet, Take 1 tablet (40 mg total) by mouth in the morning., Disp: 90 tablet, Rfl: 3   pantoprazole (PROTONIX) 40 MG tablet, Take 1 tablet (40 mg total) by mouth in the morning., Disp: 90 tablet, Rfl: 3   pantoprazole (PROTONIX) 40 MG tablet, Take 1 tablet (40 mg total) by mouth in the morning., Disp: 90 tablet, Rfl: 3   pravastatin (PRAVACHOL) 20 MG tablet, Take 1 tablet (20 mg total) by mouth at bedtime., Disp: 90 tablet, Rfl: 3   predniSONE (DELTASONE) 20 MG tablet, Take 1 tablet (20 mg total) by mouth 2 (two) times daily for 5 days, Disp: 10 tablet, Rfl: 0   pseudoephedrine (SUDAFED) 30 MG tablet, Take 1-2 tablets by mouth every 4-6 hours as needed for congestion., Disp: 30 tablet, Rfl: 0   rOPINIRole (REQUIP) 0.5 MG tablet, Take 0.5 mg by mouth daily as needed (Restless legs). , Disp: ,  Rfl:    rOPINIRole (REQUIP) 0.5 MG tablet, TAKE 1 TABLET (0.5 MG TOTAL) BY MOUTH AT BEDTIME., Disp: 90 tablet, Rfl: 3   rOPINIRole (REQUIP) 0.5 MG tablet, Take 1 tablet (0.5 mg total) by mouth at bedtime., Disp: 90 tablet, Rfl: 3   rOPINIRole (REQUIP) 0.5 MG tablet, Take 1 tablet (0.5 mg total) by mouth at bedtime., Disp: 90 tablet, Rfl: 3   Semaglutide, 2 MG/DOSE, (OZEMPIC, 2 MG/DOSE,) 8 MG/3ML SOPN, Inject 2 mg into the skin once a week., Disp: 9 mL, Rfl: 3   tirzepatide (MOUNJARO) 15 MG/0.5ML Pen, Inject 15 mg into the skin once a week., Disp: 2 mL, Rfl: 3   tirzepatide (MOUNJARO) 15 MG/0.5ML Pen, Inject 15 mg into the skin once a week., Disp: 2 mL, Rfl: 3   tirzepatide (MOUNJARO) 5 MG/0.5ML Pen, Inject 5 mg into the skin every 7 days, Disp: 2 mL, Rfl: 1   tirzepatide (MOUNJARO) 7.5 MG/0.5ML Pen, Inject 7.5 mg into the skin once a week., Disp: 2 mL, Rfl: 1   traMADol (ULTRAM) 50 MG tablet, Take 1-2 tablets (50-100 mg total) by mouth every 6 (six) hours as needed for up to 5 days, Disp: 40 tablet, Rfl: 1   Vitamin D, Ergocalciferol, (DRISDOL) 1.25 MG (50000 UNIT) CAPS capsule, Take 50,000 Units by mouth every 7 (seven) days. Sunday, Disp: , Rfl:    Vitamin D, Ergocalciferol, (DRISDOL) 1.25 MG (50000 UNIT) CAPS capsule, Take 1 capsule (50,000 Units total) by mouth once a week., Disp: 30 capsule, Rfl: 0  Social History   Tobacco Use  Smoking Status Former   Current packs/day: 0.00   Types: Cigarettes   Quit date: 12/30/2006   Years  since quitting: 17.1  Smokeless Tobacco Never    Allergies  Allergen Reactions   Cephalexin Swelling   Hydrocodone Nausea And Vomiting   Rifampin Nausea And Vomiting, Rash and Other (See Comments)    Nausea, Vomiting   Sulfa Antibiotics Hives   Imitrex [Sumatriptan] Nausea And Vomiting   Augmentin [Amoxicillin-Pot Clavulanate] Nausea And Vomiting   Metformin And Related Other (See Comments)    GI upset   Penicillins Swelling    Reaction: 2018    Objective:  There were no vitals filed for this visit. There is no height or weight on file to calculate BMI. Constitutional Well developed. Well nourished.  Vascular Dorsalis pedis pulses palpable bilaterally. Posterior tibial pulses palpable bilaterally. Capillary refill normal to all digits.  No cyanosis or clubbing noted. Pedal hair growth normal.  Neurologic Normal speech. Oriented to person, place, and time. Epicritic sensation to light touch grossly present bilaterally.  Dermatologic Nails well groomed and normal in appearance. No open wounds. No skin lesions.  Orthopedic: Pain along the course of the posterior tibial tendon including the insertion pain with resisted plantarflexion inversion of the foot no pain with dorsiflexion eversion of the foot.   Radiographs: None Assessment:   1. Posterior tibial tendinitis, right   2. Pes planovalgus     Plan:  Patient was evaluated and treated and all questions answered.  Right posterior tibial tendinitis -All questions and concerns were discussed with the patient extensive detail cam boot help decrease some of the pain that she was having she still has some residual pain she would benefit from a steroid injection I discussed the risk of rupture associate with this she states understand like to proceed -A steroid injection was performed at right medial foot at point of maximal tenderness using 1% plain Lidocaine and 10 mg of Kenalog. This was well tolerated. -Continue Tri-Lock as needed -MRI was ordered as she has not had as much relief and he continues to come back  Pes planovalgus -I explained to patient the etiology of pes planovalgus and relationship with posterior grade tendinitis and various treatment options were discussed.  Given patient foot structure in the setting of posterior tibial tendinitis I believe patient will benefit from custom-made orthotics to help control the hindfoot motion support the arch of the foot  and take the stress away from plantar fascial.  Patient agrees with the plan like to proceed with orthotics -Orthotics are functioning well     No follow-ups on file.

## 2024-02-26 ENCOUNTER — Other Ambulatory Visit (HOSPITAL_BASED_OUTPATIENT_CLINIC_OR_DEPARTMENT_OTHER): Payer: Self-pay

## 2024-02-27 ENCOUNTER — Encounter: Payer: Self-pay | Admitting: Podiatry

## 2024-03-01 ENCOUNTER — Other Ambulatory Visit (HOSPITAL_BASED_OUTPATIENT_CLINIC_OR_DEPARTMENT_OTHER): Payer: Self-pay

## 2024-03-01 MED ORDER — MOUNJARO 15 MG/0.5ML ~~LOC~~ SOAJ
15.0000 mg | SUBCUTANEOUS | 3 refills | Status: DC
  Filled 2024-03-01: qty 2, 28d supply, fill #0
  Filled 2024-03-31: qty 2, 28d supply, fill #1
  Filled 2024-05-11: qty 2, 28d supply, fill #2
  Filled 2024-06-09: qty 2, 28d supply, fill #3

## 2024-03-15 ENCOUNTER — Ambulatory Visit
Admission: RE | Admit: 2024-03-15 | Discharge: 2024-03-15 | Disposition: A | Discharge status: Home / Self Care | Payer: Commercial Managed Care - PPO | Source: Ambulatory Visit | Attending: Podiatry | Admitting: Podiatry

## 2024-03-15 DIAGNOSIS — M65871 Other synovitis and tenosynovitis, right ankle and foot: Secondary | ICD-10-CM | POA: Diagnosis not present

## 2024-03-15 DIAGNOSIS — M76821 Posterior tibial tendinitis, right leg: Secondary | ICD-10-CM

## 2024-03-15 DIAGNOSIS — M25571 Pain in right ankle and joints of right foot: Secondary | ICD-10-CM | POA: Diagnosis not present

## 2024-03-23 ENCOUNTER — Other Ambulatory Visit (HOSPITAL_COMMUNITY): Payer: Self-pay

## 2024-03-23 MED ORDER — DESVENLAFAXINE SUCCINATE ER 25 MG PO TB24
25.0000 mg | ORAL_TABLET | Freq: Every day | ORAL | 0 refills | Status: DC
  Filled 2024-03-23: qty 21, 21d supply, fill #0

## 2024-03-24 ENCOUNTER — Other Ambulatory Visit (HOSPITAL_COMMUNITY): Payer: Self-pay

## 2024-03-29 ENCOUNTER — Other Ambulatory Visit (HOSPITAL_COMMUNITY): Payer: Self-pay

## 2024-03-29 MED ORDER — ESCITALOPRAM OXALATE 10 MG PO TABS
10.0000 mg | ORAL_TABLET | Freq: Every day | ORAL | 3 refills | Status: DC
  Filled 2024-03-29: qty 11, 11d supply, fill #0
  Filled 2024-03-29: qty 79, 79d supply, fill #0

## 2024-03-31 ENCOUNTER — Ambulatory Visit: Payer: Commercial Managed Care - PPO | Admitting: Podiatry

## 2024-03-31 ENCOUNTER — Other Ambulatory Visit (HOSPITAL_BASED_OUTPATIENT_CLINIC_OR_DEPARTMENT_OTHER): Payer: Self-pay

## 2024-03-31 DIAGNOSIS — Q666 Other congenital valgus deformities of feet: Secondary | ICD-10-CM | POA: Diagnosis not present

## 2024-03-31 DIAGNOSIS — M76821 Posterior tibial tendinitis, right leg: Secondary | ICD-10-CM | POA: Diagnosis not present

## 2024-03-31 NOTE — Progress Notes (Signed)
 Subjective:  Patient ID: Stacey Clark, female    DOB: [birth year],  MRN: [id number]  Chief Complaint  Patient presents with   Foot Pain    MRI results     53 y.o. female presents with the above complaint.  Patient presents for follow-up of right posterior tibial tendinitis.  She states that she is doing good.  She is here to go over the MRI.  Her foot pain is completely resolved   Review of Systems: Negative except as noted in the HPI. Denies N/V/F/Ch.  Past Medical History:  Diagnosis Date   Anxiety    Asthma    Back pain    Complication of anesthesia    Depression    Diabetes mellitus without complication (HCC)    GERD (gastroesophageal reflux disease)    History of bronchitis    HLD (hyperlipidemia)    IBS (irritable bowel syndrome)    Joint pain    Obesity    Pneumonia    PONV (postoperative nausea and vomiting)    Sleep apnea    uses CPAP machine    Current Outpatient Medications:    albuterol (VENTOLIN HFA) 108 (90 Base) MCG/ACT inhaler, Inhale 2 puffs into the lungs every 4 (four) hours as needed for wheezing, Disp: 6.7 g, Rfl: 2   albuterol (VENTOLIN HFA) 108 (90 Base) MCG/ACT inhaler, Inhale 2 puffs into the lungs every 4 (four) hours as needed for wheezing, Disp: 6.7 g, Rfl: 2   ALPRAZolam (XANAX) 0.5 MG tablet, Take 0.5 mg by mouth 2 (two) times daily. , Disp: , Rfl:    ALPRAZolam (XANAX) 0.5 MG tablet, Take 1 tablet (0.5 mg total) by mouth 2 (two) times daily., Disp: 180 tablet, Rfl: 1   ALPRAZolam (XANAX) 0.5 MG tablet, Take 1 tablet (0.5 mg total) by mouth 2 (two) times daily., Disp: 180 tablet, Rfl: 1   azithromycin (ZITHROMAX) 500 MG tablet, Take 1 tablet (500 mg total) by mouth daily for 5 days., Disp: 5 tablet, Rfl: 0   Blood Pressure Monitoring (OMRON 3 SERIES BP MONITOR) DEVI, Use as directed, Disp: 1 each, Rfl: 0   buPROPion (WELLBUTRIN SR) 150 MG 12 hr tablet, Take 1 tablet (150 mg total) by mouth 2 (two) times daily., Disp: 60 tablet, Rfl: 0    buPROPion (WELLBUTRIN SR) 150 MG 12 hr tablet, Take 1 tablet (150 mg total) by mouth 2 (two) times daily., Disp: 180 tablet, Rfl: 3   buPROPion (WELLBUTRIN SR) 150 MG 12 hr tablet, Take 1 tablet (150 mg total) by mouth 2 (two) times daily., Disp: 180 tablet, Rfl: 3   buPROPion (WELLBUTRIN SR) 150 MG 12 hr tablet, Take 1 tablet (150 mg total) by mouth 2 (two) times daily., Disp: 180 tablet, Rfl: 3   celecoxib (CELEBREX) 200 MG capsule, Take 1 capsule (200 mg total) by mouth 2 (two) times daily., Disp: 60 capsule, Rfl: 2   Cholecalciferol (D3-50) 1.25 MG (50000 UT) capsule, Take 1 capsule (50,000 Units total) by mouth every 7 (seven) days., Disp: 30 capsule, Rfl: 0   Cholecalciferol (VITAMIN D3) 1.25 MG (50000 UT) CAPS, Take 1 capsule (50,000 Units total) by mouth once a week., Disp: 30 capsule, Rfl: 0   ciprofloxacin (CIPRO) 500 MG tablet, Take 1 tablet (500 mg total) by mouth 2 times daily for 7 days., Disp: 14 tablet, Rfl: 0   clindamycin (CLEOCIN) 150 MG capsule, Take 2 capsules now then 1 capsule 3 times a day for dental infection, Disp: 21 capsule, Rfl: 1  diclofenac Sodium (VOLTAREN) 1 % GEL, Apply a nickel sized amount to the knee up to every 6 hours as needed.  Wash hands thoroughly after use., Disp: 100 g, Rfl: 0   doxycycline (VIBRAMYCIN) 100 MG capsule, Take 1 capsule (100 mg total) by mouth 2 (two) times daily for 10 days., Disp: 20 capsule, Rfl: 0   doxycycline (VIBRAMYCIN) 100 MG capsule, Take 1 capsule (100 mg total) by mouth 2 (two) times daily for 10 days, Disp: 20 capsule, Rfl: 0   Dulaglutide (TRULICITY) 1.5 MG/0.5ML SOPN, Inject 1.5 mg into the skin every 7 (seven) days., Disp: 6 mL, Rfl: 1   ergocalciferol (VITAMIN D2) 1.25 MG (50000 UT) capsule, TAKE 1 CAPSULE BY MOUTH ONCE A WEEK., Disp: 30 capsule, Rfl: 0   escitalopram (LEXAPRO) 10 MG tablet, Take 1 tablet (10 mg total) by mouth daily., Disp: 90 tablet, Rfl: 3   escitalopram (LEXAPRO) 20 MG tablet, Take 20 mg by mouth at  bedtime. , Disp: , Rfl:    escitalopram (LEXAPRO) 20 MG tablet, Take 1 tablet (20 mg total) by mouth daily., Disp: 90 tablet, Rfl: 3   ezetimibe (ZETIA) 10 MG tablet, Take 1 tablet (10 mg total) by mouth daily., Disp: 90 tablet, Rfl: 3   ezetimibe (ZETIA) 10 MG tablet, Take 1 tablet (10 mg total) by mouth daily., Disp: 90 tablet, Rfl: 3   famotidine (PEPCID) 40 MG tablet, Take 1 tablet (40 mg total) by mouth at bedtime., Disp: 90 tablet, Rfl: 3   glucose blood (FREESTYLE LITE) test strip, USE TO CHECK BLOOD SUGAR DAILY, Disp: 100 strip, Rfl: 3   glucose blood (FREESTYLE LITE) test strip, Use 1 to check blood sugar daily., Disp: 100 strip, Rfl: 3   glucose blood (FREESTYLE LITE) test strip, USE TO CHECK BLOOD SUGAR DAILY, Disp: 100 strip, Rfl: 3   ibuprofen (ADVIL) 600 MG tablet, Take 1 tablet (600 mg total) by mouth every 8 (eight) hours as needed with food. This is for pain and inflammation after surgery., Disp: 30 tablet, Rfl: 0   ibuprofen (ADVIL) 800 MG tablet, Take 1 tablet (800 mg total) by mouth every 6-8 hours as needed., Disp: 21 tablet, Rfl: 1   Lancets (FREESTYLE) lancets, Use as directed daily, Disp: 200 each, Rfl: 3   meloxicam (MOBIC) 15 MG tablet, 1 po q d x 3 weeks (Patient taking differently: Take 15 mg by mouth daily as needed (siaka nerve pain).), Disp: 30 tablet, Rfl: 0   meloxicam (MOBIC) 7.5 MG tablet, Take 1 tablet (7.5 mg total) by mouth 2 (two) times daily for 2 weeks, then take 1 tablet twice daily as needed., Disp: 60 tablet, Rfl: 0   metroNIDAZOLE (FLAGYL) 500 MG tablet, Take 0.5 tablets (250 mg total) by mouth 3 (three) times daily for 7 days., Disp: 11 tablet, Rfl: 0   nystatin (MYCOSTATIN/NYSTOP) powder, Apply 1 Application topically 2 (two) times daily., Disp: 60 g, Rfl: 6   nystatin cream (MYCOSTATIN), Apply 1 Application topically 2 (two) times daily as needed for 14 days, Disp: 30 g, Rfl: 0   ondansetron (ZOFRAN) 4 MG tablet, Take 1 tablet (4 mg total) by mouth  every 8 (eight) hours as needed for up to 7 days for Nausea / Vomiting., Disp: 20 tablet, Rfl: 0   ondansetron (ZOFRAN) 4 MG tablet, Take 1 tablet (4 mg total) by mouth every 8 (eight) hours as needed for up to 7 days for Nausea / Vomiting., Disp: 20 tablet, Rfl: 0   oseltamivir (  TAMIFLU) 75 MG capsule, Take 1 capsule (75 mg total) by mouth 2 times daily for 5 days., Disp: 10 capsule, Rfl: 0   oxyCODONE (OXY IR/ROXICODONE) 5 MG immediate release tablet, Take 1 tablet (5 mg total) by mouth every 4 (four) hours as needed for breakthrough pain after surgery., Disp: 10 tablet, Rfl: 0   pantoprazole (PROTONIX) 20 MG tablet, Take 1 tablet (20 mg total) by mouth daily., Disp: 30 tablet, Rfl: 0   pantoprazole (PROTONIX) 20 MG tablet, Take 1 tablet (20 mg total) by mouth daily., Disp: 90 tablet, Rfl: 3   pantoprazole (PROTONIX) 40 MG tablet, Take 1 tablet (40 mg total) by mouth in the morning., Disp: 90 tablet, Rfl: 3   pantoprazole (PROTONIX) 40 MG tablet, Take 1 tablet (40 mg total) by mouth in the morning., Disp: 90 tablet, Rfl: 3   pantoprazole (PROTONIX) 40 MG tablet, Take 1 tablet (40 mg total) by mouth in the morning., Disp: 90 tablet, Rfl: 3   pravastatin (PRAVACHOL) 20 MG tablet, Take 1 tablet (20 mg total) by mouth at bedtime., Disp: 90 tablet, Rfl: 3   predniSONE (DELTASONE) 20 MG tablet, Take 1 tablet (20 mg total) by mouth 2 (two) times daily for 5 days, Disp: 10 tablet, Rfl: 0   pseudoephedrine (SUDAFED) 30 MG tablet, Take 1-2 tablets by mouth every 4-6 hours as needed for congestion., Disp: 30 tablet, Rfl: 0   rOPINIRole (REQUIP) 0.5 MG tablet, Take 0.5 mg by mouth daily as needed (Restless legs). , Disp: , Rfl:    rOPINIRole (REQUIP) 0.5 MG tablet, TAKE 1 TABLET (0.5 MG TOTAL) BY MOUTH AT BEDTIME., Disp: 90 tablet, Rfl: 3   rOPINIRole (REQUIP) 0.5 MG tablet, Take 1 tablet (0.5 mg total) by mouth at bedtime., Disp: 90 tablet, Rfl: 3   rOPINIRole (REQUIP) 0.5 MG tablet, Take 1 tablet (0.5 mg  total) by mouth at bedtime., Disp: 90 tablet, Rfl: 3   Semaglutide, 2 MG/DOSE, (OZEMPIC, 2 MG/DOSE,) 8 MG/3ML SOPN, Inject 2 mg into the skin once a week., Disp: 9 mL, Rfl: 3   tirzepatide (MOUNJARO) 15 MG/0.5ML Pen, Inject 15 mg into the skin once a week., Disp: 2 mL, Rfl: 3   tirzepatide (MOUNJARO) 15 MG/0.5ML Pen, Inject 15 mg into the skin once a week., Disp: 2 mL, Rfl: 3   tirzepatide (MOUNJARO) 5 MG/0.5ML Pen, Inject 5 mg into the skin every 7 days, Disp: 2 mL, Rfl: 1   tirzepatide (MOUNJARO) 7.5 MG/0.5ML Pen, Inject 7.5 mg into the skin once a week., Disp: 2 mL, Rfl: 1   traMADol (ULTRAM) 50 MG tablet, Take 1-2 tablets (50-100 mg total) by mouth every 6 (six) hours as needed for up to 5 days, Disp: 40 tablet, Rfl: 1   Vitamin D, Ergocalciferol, (DRISDOL) 1.25 MG (50000 UNIT) CAPS capsule, Take 50,000 Units by mouth every 7 (seven) days. Sunday, Disp: , Rfl:    Vitamin D, Ergocalciferol, (DRISDOL) 1.25 MG (50000 UNIT) CAPS capsule, Take 1 capsule (50,000 Units total) by mouth once a week., Disp: 30 capsule, Rfl: 0  Social History   Tobacco Use  Smoking Status Former   Current packs/day: 0.00   Types: Cigarettes   Quit date: 12/30/2006   Years since quitting: 17.2  Smokeless Tobacco Never    Allergies  Allergen Reactions   Cephalexin Swelling   Hydrocodone Nausea And Vomiting   Rifampin Nausea And Vomiting, Rash and Other (See Comments)    Nausea, Vomiting   Sulfa Antibiotics Hives   Imitrex [  Sumatriptan] Nausea And Vomiting   Augmentin [Amoxicillin-Pot Clavulanate] Nausea And Vomiting   Metformin And Related Other (See Comments)    GI upset   Penicillins Swelling    Reaction: 2018   Objective:  There were no vitals filed for this visit. There is no height or weight on file to calculate BMI. Constitutional Well developed. Well nourished.  Vascular Dorsalis pedis pulses palpable bilaterally. Posterior tibial pulses palpable bilaterally. Capillary refill normal to all  digits.  No cyanosis or clubbing noted. Pedal hair growth normal.  Neurologic Normal speech. Oriented to person, place, and time. Epicritic sensation to light touch grossly present bilaterally.  Dermatologic Nails well groomed and normal in appearance. No open wounds. No skin lesions.  Orthopedic: No further pain along the course of the posterior tibial tendon including the insertion no further pain with resisted plantarflexion inversion of the foot no pain with dorsiflexion eversion of the foot.   Radiographs: None Assessment:   No diagnosis found.   Plan:  Patient was evaluated and treated and all questions answered.  Right posterior tibial tendinitis -Clinically pain has resolved therefore I discussed continuous use of ankle brace as needed and good shoe gear modification.  If any foot and ankle issues on future she will come back and see me -MRI was reviewed with the patient which did not show any posterior tibial tendon abnormality.  Some component of sinus tarsi syndrome which her pain is resolved  Pes planovalgus -I explained to patient the etiology of pes planovalgus and relationship with posterior grade tendinitis and various treatment options were discussed.  Given patient foot structure in the setting of posterior tibial tendinitis I believe patient will benefit from custom-made orthotics to help control the hindfoot motion support the arch of the foot and take the stress away from plantar fascial.  Patient agrees with the plan like to proceed with orthotics -Orthotics are functioning well     No follow-ups on file.

## 2024-04-03 ENCOUNTER — Other Ambulatory Visit (HOSPITAL_BASED_OUTPATIENT_CLINIC_OR_DEPARTMENT_OTHER): Payer: Self-pay

## 2024-04-05 ENCOUNTER — Other Ambulatory Visit: Payer: Self-pay

## 2024-04-29 ENCOUNTER — Other Ambulatory Visit (HOSPITAL_BASED_OUTPATIENT_CLINIC_OR_DEPARTMENT_OTHER): Payer: Self-pay

## 2024-04-29 ENCOUNTER — Other Ambulatory Visit: Payer: Self-pay

## 2024-04-29 DIAGNOSIS — F419 Anxiety disorder, unspecified: Secondary | ICD-10-CM | POA: Diagnosis not present

## 2024-04-29 DIAGNOSIS — I10 Essential (primary) hypertension: Secondary | ICD-10-CM | POA: Diagnosis not present

## 2024-04-29 DIAGNOSIS — E1165 Type 2 diabetes mellitus with hyperglycemia: Secondary | ICD-10-CM | POA: Diagnosis not present

## 2024-04-29 DIAGNOSIS — M25552 Pain in left hip: Secondary | ICD-10-CM | POA: Diagnosis not present

## 2024-04-29 DIAGNOSIS — E782 Mixed hyperlipidemia: Secondary | ICD-10-CM | POA: Diagnosis not present

## 2024-04-29 DIAGNOSIS — K2100 Gastro-esophageal reflux disease with esophagitis, without bleeding: Secondary | ICD-10-CM | POA: Diagnosis not present

## 2024-04-29 MED ORDER — ESCITALOPRAM OXALATE 20 MG PO TABS
20.0000 mg | ORAL_TABLET | Freq: Every day | ORAL | 3 refills | Status: AC
  Filled 2024-04-29: qty 90, 90d supply, fill #0
  Filled 2024-07-29: qty 90, 90d supply, fill #1
  Filled 2024-11-01: qty 90, 90d supply, fill #0
  Filled 2025-01-31: qty 90, 90d supply, fill #1

## 2024-04-29 MED ORDER — EZETIMIBE 10 MG PO TABS
10.0000 mg | ORAL_TABLET | Freq: Every day | ORAL | 3 refills | Status: DC
  Filled 2024-04-29: qty 90, 90d supply, fill #0

## 2024-04-29 MED ORDER — FAMOTIDINE 40 MG PO TABS
40.0000 mg | ORAL_TABLET | Freq: Every day | ORAL | 3 refills | Status: DC
  Filled 2024-04-29 – 2024-11-01 (×3): qty 90, 90d supply, fill #0

## 2024-04-29 MED ORDER — BUPROPION HCL ER (SR) 150 MG PO TB12
150.0000 mg | ORAL_TABLET | Freq: Two times a day (BID) | ORAL | 3 refills | Status: AC
  Filled 2024-04-29 – 2024-05-21 (×2): qty 180, 90d supply, fill #0
  Filled 2024-08-31: qty 180, 90d supply, fill #1
  Filled 2024-12-07: qty 180, 90d supply, fill #2

## 2024-04-29 MED ORDER — PANTOPRAZOLE SODIUM 40 MG PO TBEC
40.0000 mg | DELAYED_RELEASE_TABLET | Freq: Every morning | ORAL | 3 refills | Status: DC
  Filled 2024-04-29: qty 90, 90d supply, fill #0
  Filled 2024-07-29: qty 90, 90d supply, fill #1
  Filled 2024-11-01: qty 90, 90d supply, fill #0

## 2024-05-03 DIAGNOSIS — E119 Type 2 diabetes mellitus without complications: Secondary | ICD-10-CM | POA: Diagnosis not present

## 2024-05-04 ENCOUNTER — Other Ambulatory Visit (HOSPITAL_BASED_OUTPATIENT_CLINIC_OR_DEPARTMENT_OTHER): Payer: Self-pay

## 2024-05-11 ENCOUNTER — Other Ambulatory Visit (HOSPITAL_BASED_OUTPATIENT_CLINIC_OR_DEPARTMENT_OTHER): Payer: Self-pay

## 2024-05-17 ENCOUNTER — Other Ambulatory Visit (HOSPITAL_BASED_OUTPATIENT_CLINIC_OR_DEPARTMENT_OTHER): Payer: Self-pay

## 2024-05-20 ENCOUNTER — Encounter: Payer: Self-pay | Admitting: Podiatry

## 2024-05-21 ENCOUNTER — Other Ambulatory Visit (HOSPITAL_BASED_OUTPATIENT_CLINIC_OR_DEPARTMENT_OTHER): Payer: Self-pay

## 2024-05-25 ENCOUNTER — Other Ambulatory Visit (HOSPITAL_BASED_OUTPATIENT_CLINIC_OR_DEPARTMENT_OTHER): Payer: Self-pay

## 2024-05-29 ENCOUNTER — Other Ambulatory Visit: Payer: Self-pay

## 2024-05-29 ENCOUNTER — Ambulatory Visit
Admission: RE | Admit: 2024-05-29 | Discharge: 2024-05-29 | Disposition: A | Discharge status: Home / Self Care | Source: Ambulatory Visit | Attending: Physician Assistant

## 2024-05-29 VITALS — BP 114/69 | HR 65 | Temp 98.2°F | Resp 18 | Ht 61.0 in | Wt 257.0 lb

## 2024-05-29 DIAGNOSIS — M76821 Posterior tibial tendinitis, right leg: Secondary | ICD-10-CM

## 2024-05-29 DIAGNOSIS — M79671 Pain in right foot: Secondary | ICD-10-CM

## 2024-05-29 MED ORDER — KETOROLAC TROMETHAMINE 30 MG/ML IJ SOLN
30.0000 mg | Freq: Once | INTRAMUSCULAR | Status: AC
  Administered 2024-05-29: 30 mg via INTRAMUSCULAR

## 2024-05-29 NOTE — ED Triage Notes (Addendum)
 Pt presents with complaints of right ankle/foot pain x 1 day. Pt currently rates her overall pain an 8/10. Denies recent injuries. States she believes this is a flare-up of her tendonitis. Two extra-strength Tylenol  taken this morning at 0600 with no improvement in pain. Pt is established with a podiatrist for ongoing management. Ambulatory to triage room.

## 2024-05-29 NOTE — ED Provider Notes (Signed)
 [facility]    CSN: [id number] Arrival date & time: 05/29/24  0865      History   Chief Complaint Chief Complaint  Patient presents with   Ankle Pain    I have pain in my right ankle & into the bottom of my foot . I can bare weight on it - Entered by patient   Foot Pain    HPI Stacey Clark is a 53 y.o. female.   HPI  Pt reports pain along medial aspect of her right foot  She states she has previous hx of issues with this foot and has received steroid injections for this in the past She reports last steroid injection in the foot was Feb  She reports she was on her feet for a prolonged period yesterday and this seemed to aggravate it a lot  She also reports some pain along the lateral aspect of her right lower leg and ankle   Interventions: she has tried [employer]     Past Medical History:  Diagnosis Date   Anxiety    Asthma    Back pain    Complication of anesthesia    Depression    Diabetes mellitus without complication (HCC)    GERD (gastroesophageal reflux disease)    History of bronchitis    HLD (hyperlipidemia)    IBS (irritable bowel syndrome)    Joint pain    Obesity    Pneumonia    PONV (postoperative nausea and vomiting)    Sleep apnea    uses CPAP machine    Patient Active Problem List   Diagnosis Date Noted   Pain of left thumb 03/12/2022   Depression 06/21/2019   Gastroesophageal reflux disease 01/19/2019   Vitamin D  deficiency 01/04/2019   Other fatigue 05/14/2018   Shortness of breath on exertion 05/14/2018   Type 2 diabetes mellitus without complication, without long-term current use of insulin  (HCC) 05/14/2018   Other hyperlipidemia 05/14/2018   Obstructive sleep apnea syndrome 05/14/2018   Obesity, unspecified 06/02/2014    Past Surgical History:  Procedure Laterality Date   BIOPSY  12/04/2020   Procedure: BIOPSY;  Surgeon: Felecia Hopper, MD;  Location: [facility];  Service: Gastroenterology;;   CARPAL TUNNEL  RELEASE Right 05/25/2020   Procedure: RIGHT CARPAL TUNNEL RELEASE;  Surgeon: Jasmine Mesi, MD;  Location: [facility];  Service: Orthopedics;  Laterality: Right;   CESAREAN SECTION     COLONOSCOPY WITH PROPOFOL  N/A 12/04/2020   Procedure: COLONOSCOPY WITH PROPOFOL ;  Surgeon: Felecia Hopper, MD;  Location: [facility];  Service: Gastroenterology;  Laterality: N/A;   ESOPHAGOPLASTY     POLYPECTOMY  12/04/2020   Procedure: POLYPECTOMY;  Surgeon: Felecia Hopper, MD;  Location: [facility];  Service: Gastroenterology;;   TONSILLECTOMY      OB History     Gravida  1   Para      Term      Preterm      AB      Living  1      SAB      IAB      Ectopic      Multiple      Live Births               Home Medications    Prior to Admission medications   Medication Sig Start Date End Date Taking? Authorizing Provider  albuterol  (VENTOLIN  HFA) 108 (90 Base) MCG/ACT inhaler Inhale 2 puffs into the lungs every 4 (  four) hours as needed for wheezing 10/15/22     albuterol  (VENTOLIN  HFA) 108 (90 Base) MCG/ACT inhaler Inhale 2 puffs into the lungs every 4 (four) hours as needed for wheezing 04/22/23     ALPRAZolam  (XANAX ) 0.5 MG tablet Take 0.5 mg by mouth 2 (two) times daily.     [provider]  ALPRAZolam  (XANAX ) 0.5 MG tablet Take 1 tablet (0.5 mg total) by mouth 2 (two) times daily. 08/30/22     ALPRAZolam  (XANAX ) 0.5 MG tablet Take 1 tablet (0.5 mg total) by mouth 2 (two) times daily. 12/08/23     azithromycin  (ZITHROMAX ) 500 MG tablet Take 1 tablet (500 mg total) by mouth daily for 5 days. 01/15/24     Blood Pressure Monitoring (OMRON 3 SERIES BP MONITOR) DEVI Use as directed 01/25/22   Belanger, Harman Lightning, RPH  buPROPion  (WELLBUTRIN  SR) 150 MG 12 hr tablet Take 1 tablet (150 mg total) by mouth 2 (two) times daily. 06/28/19   Whitmire, Dawn, FNP  buPROPion  (WELLBUTRIN  SR) 150 MG 12 hr tablet Take 1 tablet (150 mg total) by mouth 2 (two) times daily.  12/04/21     buPROPion  (WELLBUTRIN  SR) 150 MG 12 hr tablet Take 1 tablet (150 mg total) by mouth 2 (two) times daily. 12/04/22     buPROPion  (WELLBUTRIN  SR) 150 MG 12 hr tablet Take 1 tablet (150 mg total) by mouth 2 (two) times daily. 04/29/24     celecoxib  (CELEBREX ) 200 MG capsule Take 1 capsule (200 mg total) by mouth 2 (two) times daily. 06/27/22     Cholecalciferol  (D3-50) 1.25 MG (50000 UT) capsule Take 1 capsule (50,000 Units total) by mouth every 7 (seven) days. 09/25/22     Cholecalciferol  (VITAMIN D3) 1.25 MG (50000 UT) CAPS Take 1 capsule (50,000 Units total) by mouth once a week. 12/01/23     ciprofloxacin  (CIPRO ) 500 MG tablet Take 1 tablet (500 mg total) by mouth 2 times daily for 7 days. 01/03/23     clindamycin  (CLEOCIN ) 150 MG capsule Take 2 capsules now then 1 capsule 3 times a day for dental infection 05/07/23     diclofenac  Sodium (VOLTAREN ) 1 % GEL Apply a nickel sized amount to the knee up to every 6 hours as needed.  Wash hands thoroughly after use. 06/11/22     doxycycline  (VIBRAMYCIN ) 100 MG capsule Take 1 capsule (100 mg total) by mouth 2 (two) times daily for 10 days. 11/18/22     doxycycline  (VIBRAMYCIN ) 100 MG capsule Take 1 capsule (100 mg total) by mouth 2 (two) times daily for 10 days 02/20/23     Dulaglutide  (TRULICITY ) 1.5 MG/0.5ML SOPN Inject 1.5 mg into the skin every 7 (seven) days. 10/05/21     ergocalciferol  (VITAMIN D2) 1.25 MG (50000 UT) capsule TAKE 1 CAPSULE BY MOUTH ONCE A WEEK. 07/21/21     escitalopram  (LEXAPRO ) 20 MG tablet Take 20 mg by mouth at bedtime.     [provider]  escitalopram  (LEXAPRO ) 20 MG tablet Take 1 tablet (20 mg total) by mouth daily. 08/28/22     escitalopram  (LEXAPRO ) 20 MG tablet Take 1 tablet (20 mg total) by mouth daily. 04/29/24     ezetimibe  (ZETIA ) 10 MG tablet Take 1 tablet (10 mg total) by mouth daily. 12/05/22     ezetimibe  (ZETIA ) 10 MG tablet Take 1 tablet (10 mg total) by mouth daily. 04/29/24     famotidine  (PEPCID ) 40 MG tablet  Take 1 tablet (40 mg total) by mouth  at bedtime. 04/29/24     glucose blood (FREESTYLE LITE) test strip USE TO CHECK BLOOD SUGAR DAILY 07/27/21     glucose blood (FREESTYLE LITE) test strip Use 1 to check blood sugar daily. 04/08/22     glucose blood (FREESTYLE LITE) test strip USE TO CHECK BLOOD SUGAR DAILY 02/03/23     ibuprofen  (ADVIL ) 600 MG tablet Take 1 tablet (600 mg total) by mouth every 8 (eight) hours as needed with food. This is for pain and inflammation after surgery. 10/02/22     ibuprofen  (ADVIL ) 800 MG tablet Take 1 tablet (800 mg total) by mouth every 6-8 hours as needed. 05/07/23     Lancets (FREESTYLE) lancets Use as directed daily 07/27/21     meloxicam  (MOBIC ) 15 MG tablet 1 po q d x 3 weeks Patient taking differently: Take 15 mg by mouth daily as needed (siaka nerve pain). 08/09/20   Jasmine Mesi, MD  meloxicam  (MOBIC ) 7.5 MG tablet Take 1 tablet (7.5 mg total) by mouth 2 (two) times daily for 2 weeks, then take 1 tablet twice daily as needed. 03/25/22     metroNIDAZOLE  (FLAGYL ) 500 MG tablet Take 0.5 tablets (250 mg total) by mouth 3 (three) times daily for 7 days. 01/03/23     nystatin  (MYCOSTATIN /NYSTOP ) powder Apply 1 Application topically 2 (two) times daily. 08/27/23     nystatin  cream (MYCOSTATIN ) Apply 1 Application topically 2 (two) times daily as needed for 14 days 04/25/23     ondansetron  (ZOFRAN ) 4 MG tablet Take 1 tablet (4 mg total) by mouth every 8 (eight) hours as needed for up to 7 days for Nausea / Vomiting. 01/01/22     ondansetron  (ZOFRAN ) 4 MG tablet Take 1 tablet (4 mg total) by mouth every 8 (eight) hours as needed for up to 7 days for Nausea / Vomiting. 01/20/23     oseltamivir  (TAMIFLU ) 75 MG capsule Take 1 capsule (75 mg total) by mouth 2 times daily for 5 days. 01/01/22     oxyCODONE  (OXY IR/ROXICODONE ) 5 MG immediate release tablet Take 1 tablet (5 mg total) by mouth every 4 (four) hours as needed for breakthrough pain after surgery. 10/02/22     pantoprazole   (PROTONIX ) 20 MG tablet Take 1 tablet (20 mg total) by mouth daily. 06/28/19   Whitmire, Dawn, FNP  pantoprazole  (PROTONIX ) 20 MG tablet Take 1 tablet (20 mg total) by mouth daily. 08/24/21     pantoprazole  (PROTONIX ) 40 MG tablet Take 1 tablet (40 mg total) by mouth in the morning. 07/11/22     pantoprazole  (PROTONIX ) 40 MG tablet Take 1 tablet (40 mg total) by mouth in the morning. 02/03/23     pantoprazole  (PROTONIX ) 40 MG tablet Take 1 tablet (40 mg total) by mouth every morning. 04/29/24     pravastatin  (PRAVACHOL ) 20 MG tablet Take 1 tablet (20 mg total) by mouth at bedtime. 12/05/21     predniSONE  (DELTASONE ) 20 MG tablet Take 1 tablet (20 mg total) by mouth 2 (two) times daily for 5 days 02/20/23     pseudoephedrine  (SUDAFED) 30 MG tablet Take 1-2 tablets by mouth every 4-6 hours as needed for congestion. 03/05/21     rOPINIRole  (REQUIP ) 0.5 MG tablet Take 0.5 mg by mouth daily as needed (Restless legs).  11/08/20   [provider]  rOPINIRole  (REQUIP ) 0.5 MG tablet TAKE 1 TABLET (0.5 MG TOTAL) BY MOUTH AT BEDTIME. 11/08/20 11/08/21  Melva Stabile, MD  rOPINIRole  (REQUIP ) 0.5 MG tablet  Take 1 tablet (0.5 mg total) by mouth at bedtime. 12/04/21     rOPINIRole  (REQUIP ) 0.5 MG tablet Take 1 tablet (0.5 mg total) by mouth at bedtime. 12/04/22     Semaglutide , 2 MG/DOSE, (OZEMPIC , 2 MG/DOSE,) 8 MG/3ML SOPN Inject 2 mg into the skin once a week. 04/30/22     tirzepatide  (MOUNJARO ) 15 MG/0.5ML Pen Inject 15 mg into the skin once a week. 12/29/22     tirzepatide  (MOUNJARO ) 15 MG/0.5ML Pen Inject 15 mg into the skin once a week. 03/01/24     tirzepatide  (MOUNJARO ) 5 MG/0.5ML Pen Inject 5 mg into the skin every 7 days 09/25/22     tirzepatide  (MOUNJARO ) 7.5 MG/0.5ML Pen Inject 7.5 mg into the skin once a week. 10/28/22     traMADol  (ULTRAM ) 50 MG tablet Take 1-2 tablets (50-100 mg total) by mouth every 6 (six) hours as needed for up to 5 days 07/22/22     Vitamin D , Ergocalciferol , (DRISDOL ) 1.25 MG (50000  UNIT) CAPS capsule Take 50,000 Units by mouth every 7 (seven) days. Sunday    [provider]  Vitamin D , Ergocalciferol , (DRISDOL ) 1.25 MG (50000 UNIT) CAPS capsule Take 1 capsule (50,000 Units total) by mouth once a week. 02/27/22     desvenlafaxine  (PRISTIQ ) 25 MG 24 hr tablet Take 1 tablet (25 mg total) by mouth daily. 03/23/24 03/29/24      Family History Family History  Problem Relation Age of Onset   Hypertension Mother    Cancer Mother    Depression Mother    Anxiety disorder Mother    Sleep apnea Mother    Obesity Mother    Ovarian cancer Mother 92   Hyperlipidemia Father    Cancer Father    Depression Father    Anxiety disorder Father    Bipolar disorder Father    Alcoholism Father    Obesity Father    Thyroid  disease Neg Hx    BRCA 1/2 Neg Hx    Breast cancer Neg Hx     Social History Social History   Tobacco Use   Smoking status: Former    Current packs/day: 0.00    Types: Cigarettes    Quit date: 12/30/2006    Years since quitting: 17.4   Smokeless tobacco: Never  Vaping Use   Vaping status: Never Used  Substance Use Topics   Alcohol use: No    Alcohol/week: 0.0 standard drinks of alcohol   Drug use: No     Allergies   Cephalexin , Hydrocodone , Rifampin, Sulfa antibiotics, Imitrex [sumatriptan], Augmentin  [amoxicillin -pot clavulanate], Metformin and related, and Penicillins   Review of Systems Review of Systems  Musculoskeletal:  Positive for myalgias.       Foot pain       Physical Exam Triage Vital Signs ED Triage Vitals  Encounter Vitals Group     BP 05/29/24 1005 114/69     Systolic BP Percentile --      Diastolic BP Percentile --      Pulse Rate 05/29/24 1005 65     Resp 05/29/24 1005 18     Temp 05/29/24 1005 98.2 F (36.8 C)     Temp Source 05/29/24 1005 Oral     SpO2 05/29/24 1005 94 %     Weight 05/29/24 1005 257 lb (116.6 kg)     Height 05/29/24 1005 5\' 1"  (1.549 m)     Head Circumference --      Peak Flow --  Pain Score 05/29/24 1016 8     Pain Loc --      Pain Education --      Exclude from Growth Chart --    No data found.  Updated Vital Signs BP 114/69 (BP Location: Right Arm)   Pulse 65   Temp 98.2 F (36.8 C) (Oral)   Resp 18   Ht 5\' 1"  (1.549 m)   Wt 257 lb (116.6 kg)   LMP 04/29/2018 (Approximate)   SpO2 94%   BMI 48.56 kg/m   Visual Acuity Right Eye Distance:   Left Eye Distance:   Bilateral Distance:    Right Eye Near:   Left Eye Near:    Bilateral Near:     Physical Exam Vitals reviewed.  Constitutional:      General: She is awake. She is not in acute distress.    Appearance: Normal appearance. She is well-developed and well-groomed. She is not ill-appearing or toxic-appearing.  HENT:     Head: Normocephalic and atraumatic.  Eyes:     General: Lids are normal. Gaze aligned appropriately.     Extraocular Movements: Extraocular movements intact.     Conjunctiva/sclera: Conjunctivae normal.  Pulmonary:     Effort: Pulmonary effort is normal.  Musculoskeletal:     Right ankle: No swelling. No tenderness. Normal range of motion.     Right foot: Normal range of motion and normal capillary refill. Tenderness present. No swelling. Normal pulse.       Feet:     Comments: She has intact ROM with regards to dorsiflexion, plantarflexion, supination but some mildly reduced pronation  No obvious swelling, erythema, bruising or drainage   Neurological:     General: No focal deficit present.     Mental Status: She is alert and oriented to person, place, and time.     GCS: GCS eye subscore is 4. GCS verbal subscore is 5. GCS motor subscore is 6.     Cranial Nerves: No cranial nerve deficit, dysarthria or facial asymmetry.  Psychiatric:        Attention and Perception: Attention and perception normal.        Mood and Affect: Mood and affect normal.        Speech: Speech normal.        Behavior: Behavior normal. Behavior is cooperative.      UC Treatments / Results   Labs (all labs ordered are listed, but only abnormal results are displayed) Labs Reviewed - No data to display  EKG   Radiology No results found.  Procedures Procedures (including critical care time)  Medications Ordered in UC Medications - No data to display  Initial Impression / Assessment and Plan / UC Course  I have reviewed the triage vital signs and the nursing notes.  Pertinent labs & imaging results that were available during my care of the patient were reviewed by me and considered in my medical decision making (see chart for details).      Reviewed lab work from 04/29/2024 which shows an A1c of 6.7% and EGFR of greater than 90. Patient appears to have a history of GERD but is on several antacid medications such as Protonix , famotidine .  It appears the patient has been prescribed NSAIDs without obvious issue.  She does not appear to have contraindications to NSAID use at this time Final Clinical Impressions(s) / UC Diagnoses   Final diagnoses:  None   Discharge Instructions   None    ED Prescriptions  None    PDMP not reviewed this encounter.

## 2024-05-29 NOTE — Discharge Instructions (Signed)
 You are seen today for concerns of right foot pain that is likely secondary to your tendinitis. We have provided you with a Toradol  30 mg injection to assist with your discomfort.  This is an NSAID medication that typically helps with inflammation and pain and should last for about a week.  We have also sent you home with a postop shoe to help with stabilizing her foot and hopefully help relieve some of your discomfort.  I do recommend that you follow-up with your podiatrist for ongoing management and evaluation since this appears to be an ongoing, chronic issue. As needed you can use Tylenol  and ibuprofen  for further pain relief.  Per your preference you can also use your meloxicam  instead of ibuprofen  as needed. If at any point you start to develop difficulty moving your foot, severe swelling, severe pain, numbness or tingling in your foot, discoloration of your toes please go to the nearest emergency room as these could be signs of a medical emergency.

## 2024-06-09 ENCOUNTER — Other Ambulatory Visit (HOSPITAL_BASED_OUTPATIENT_CLINIC_OR_DEPARTMENT_OTHER): Payer: Self-pay

## 2024-06-17 ENCOUNTER — Other Ambulatory Visit (HOSPITAL_BASED_OUTPATIENT_CLINIC_OR_DEPARTMENT_OTHER): Payer: Self-pay

## 2024-06-17 DIAGNOSIS — G4733 Obstructive sleep apnea (adult) (pediatric): Secondary | ICD-10-CM | POA: Diagnosis not present

## 2024-06-17 MED ORDER — WEGOVY 0.5 MG/0.5ML ~~LOC~~ SOAJ
0.5000 mg | SUBCUTANEOUS | 0 refills | Status: DC
  Filled 2024-06-17: qty 2, 28d supply, fill #0

## 2024-06-24 DIAGNOSIS — G4733 Obstructive sleep apnea (adult) (pediatric): Secondary | ICD-10-CM | POA: Diagnosis not present

## 2024-06-28 ENCOUNTER — Other Ambulatory Visit (HOSPITAL_BASED_OUTPATIENT_CLINIC_OR_DEPARTMENT_OTHER): Payer: Self-pay

## 2024-06-28 ENCOUNTER — Other Ambulatory Visit: Payer: Self-pay

## 2024-06-28 MED ORDER — OZEMPIC (2 MG/DOSE) 8 MG/3ML ~~LOC~~ SOPN
2.0000 mg | PEN_INJECTOR | SUBCUTANEOUS | 0 refills | Status: DC
  Filled 2024-06-28: qty 3, 28d supply, fill #0

## 2024-07-01 ENCOUNTER — Other Ambulatory Visit (HOSPITAL_BASED_OUTPATIENT_CLINIC_OR_DEPARTMENT_OTHER): Payer: Self-pay

## 2024-07-01 ENCOUNTER — Other Ambulatory Visit (HOSPITAL_COMMUNITY): Payer: Self-pay

## 2024-07-07 ENCOUNTER — Other Ambulatory Visit (HOSPITAL_COMMUNITY): Payer: Self-pay

## 2024-07-07 ENCOUNTER — Other Ambulatory Visit (HOSPITAL_BASED_OUTPATIENT_CLINIC_OR_DEPARTMENT_OTHER): Payer: Self-pay

## 2024-07-07 MED ORDER — VITAMIN D (ERGOCALCIFEROL) 1.25 MG (50000 UNIT) PO CAPS
50000.0000 [IU] | ORAL_CAPSULE | ORAL | 0 refills | Status: AC
  Filled 2024-07-07: qty 12, 84d supply, fill #0
  Filled 2024-11-15 – 2024-12-22 (×2): qty 12, 84d supply, fill #1

## 2024-07-24 DIAGNOSIS — G4733 Obstructive sleep apnea (adult) (pediatric): Secondary | ICD-10-CM | POA: Diagnosis not present

## 2024-07-29 ENCOUNTER — Other Ambulatory Visit: Payer: Self-pay

## 2024-07-29 ENCOUNTER — Other Ambulatory Visit (HOSPITAL_BASED_OUTPATIENT_CLINIC_OR_DEPARTMENT_OTHER): Payer: Self-pay

## 2024-07-30 ENCOUNTER — Other Ambulatory Visit: Payer: Self-pay

## 2024-08-08 ENCOUNTER — Encounter: Payer: Self-pay | Admitting: Podiatry

## 2024-08-11 ENCOUNTER — Ambulatory Visit (INDEPENDENT_AMBULATORY_CARE_PROVIDER_SITE_OTHER): Admitting: Podiatry

## 2024-08-11 DIAGNOSIS — M25571 Pain in right ankle and joints of right foot: Secondary | ICD-10-CM

## 2024-08-11 DIAGNOSIS — M76821 Posterior tibial tendinitis, right leg: Secondary | ICD-10-CM

## 2024-08-11 NOTE — Progress Notes (Unsigned)
 Subjective:  Patient ID: Stacey Clark, female    DOB: 01-29-1971,  MRN: [id number]  Chief Complaint  Patient presents with   Injections    53 y.o. female presents with the above complaint.  Patient presents for follow-up of right posterior tibial tendinitis.  She states that she still has some pain.  She wants to discuss further treatment plan.  She had her MRI completed as well she is here to go over the results  Review of Systems: Negative except as noted in the HPI. Denies N/V/F/Ch.  Past Medical History:  Diagnosis Date   Anxiety    Asthma    Back pain    Complication of anesthesia    Depression    Diabetes mellitus without complication (HCC)    GERD (gastroesophageal reflux disease)    History of bronchitis    HLD (hyperlipidemia)    IBS (irritable bowel syndrome)    Joint pain    Obesity    Pneumonia    PONV (postoperative nausea and vomiting)    Sleep apnea    uses CPAP machine    Current Outpatient Medications:    albuterol  (VENTOLIN  HFA) 108 (90 Base) MCG/ACT inhaler, Inhale 2 puffs into the lungs every 4 (four) hours as needed for wheezing, Disp: 6.7 g, Rfl: 2   albuterol  (VENTOLIN  HFA) 108 (90 Base) MCG/ACT inhaler, Inhale 2 puffs into the lungs every 4 (four) hours as needed for wheezing, Disp: 6.7 g, Rfl: 2   ALPRAZolam  (XANAX ) 0.5 MG tablet, Take 0.5 mg by mouth 2 (two) times daily. , Disp: , Rfl:    ALPRAZolam  (XANAX ) 0.5 MG tablet, Take 1 tablet (0.5 mg total) by mouth 2 (two) times daily., Disp: 180 tablet, Rfl: 1   ALPRAZolam  (XANAX ) 0.5 MG tablet, Take 1 tablet (0.5 mg total) by mouth 2 (two) times daily., Disp: 180 tablet, Rfl: 1   azithromycin  (ZITHROMAX ) 500 MG tablet, Take 1 tablet (500 mg total) by mouth daily for 5 days., Disp: 5 tablet, Rfl: 0   Blood Pressure Monitoring (OMRON 3 SERIES BP MONITOR) DEVI, Use as directed, Disp: 1 each, Rfl: 0   buPROPion  (WELLBUTRIN  SR) 150 MG 12 hr tablet, Take 1 tablet (150 mg total) by mouth 2 (two) times daily.,  Disp: 60 tablet, Rfl: 0   buPROPion  (WELLBUTRIN  SR) 150 MG 12 hr tablet, Take 1 tablet (150 mg total) by mouth 2 (two) times daily., Disp: 180 tablet, Rfl: 3   buPROPion  (WELLBUTRIN  SR) 150 MG 12 hr tablet, Take 1 tablet (150 mg total) by mouth 2 (two) times daily., Disp: 180 tablet, Rfl: 3   buPROPion  (WELLBUTRIN  SR) 150 MG 12 hr tablet, Take 1 tablet (150 mg total) by mouth 2 (two) times daily., Disp: 180 tablet, Rfl: 3   celecoxib  (CELEBREX ) 200 MG capsule, Take 1 capsule (200 mg total) by mouth 2 (two) times daily., Disp: 60 capsule, Rfl: 2   Cholecalciferol  (D3-50) 1.25 MG (50000 UT) capsule, Take 1 capsule (50,000 Units total) by mouth every 7 (seven) days., Disp: 30 capsule, Rfl: 0   Cholecalciferol  (VITAMIN D3) 1.25 MG (50000 UT) CAPS, Take 1 capsule (50,000 Units total) by mouth once a week., Disp: 30 capsule, Rfl: 0   ciprofloxacin  (CIPRO ) 500 MG tablet, Take 1 tablet (500 mg total) by mouth 2 times daily for 7 days., Disp: 14 tablet, Rfl: 0   clindamycin  (CLEOCIN ) 150 MG capsule, Take 2 capsules now then 1 capsule 3 times a day for dental infection, Disp: 21 capsule, Rfl:  1   diclofenac  Sodium (VOLTAREN ) 1 % GEL, Apply a nickel sized amount to the knee up to every 6 hours as needed.  Wash hands thoroughly after use., Disp: 100 g, Rfl: 0   doxycycline  (VIBRAMYCIN ) 100 MG capsule, Take 1 capsule (100 mg total) by mouth 2 (two) times daily for 10 days., Disp: 20 capsule, Rfl: 0   doxycycline  (VIBRAMYCIN ) 100 MG capsule, Take 1 capsule (100 mg total) by mouth 2 (two) times daily for 10 days, Disp: 20 capsule, Rfl: 0   Dulaglutide  (TRULICITY ) 1.5 MG/0.5ML SOPN, Inject 1.5 mg into the skin every 7 (seven) days., Disp: 6 mL, Rfl: 1   ergocalciferol  (VITAMIN D2) 1.25 MG (50000 UT) capsule, TAKE 1 CAPSULE BY MOUTH ONCE A WEEK., Disp: 30 capsule, Rfl: 0   escitalopram  (LEXAPRO ) 20 MG tablet, Take 20 mg by mouth at bedtime. , Disp: , Rfl:    escitalopram  (LEXAPRO ) 20 MG tablet, Take 1 tablet (20 mg  total) by mouth daily., Disp: 90 tablet, Rfl: 3   escitalopram  (LEXAPRO ) 20 MG tablet, Take 1 tablet (20 mg total) by mouth daily., Disp: 90 tablet, Rfl: 3   ezetimibe  (ZETIA ) 10 MG tablet, Take 1 tablet (10 mg total) by mouth daily., Disp: 90 tablet, Rfl: 3   ezetimibe  (ZETIA ) 10 MG tablet, Take 1 tablet (10 mg total) by mouth daily., Disp: 90 tablet, Rfl: 3   famotidine  (PEPCID ) 40 MG tablet, Take 1 tablet (40 mg total) by mouth at bedtime., Disp: 90 tablet, Rfl: 3   glucose blood (FREESTYLE LITE) test strip, USE TO CHECK BLOOD SUGAR DAILY, Disp: 100 strip, Rfl: 3   glucose blood (FREESTYLE LITE) test strip, Use 1 to check blood sugar daily., Disp: 100 strip, Rfl: 3   glucose blood (FREESTYLE LITE) test strip, USE TO CHECK BLOOD SUGAR DAILY, Disp: 100 strip, Rfl: 3   ibuprofen  (ADVIL ) 600 MG tablet, Take 1 tablet (600 mg total) by mouth every 8 (eight) hours as needed with food. This is for pain and inflammation after surgery., Disp: 30 tablet, Rfl: 0   ibuprofen  (ADVIL ) 800 MG tablet, Take 1 tablet (800 mg total) by mouth every 6-8 hours as needed., Disp: 21 tablet, Rfl: 1   Lancets (FREESTYLE) lancets, Use as directed daily, Disp: 200 each, Rfl: 3   meloxicam  (MOBIC ) 15 MG tablet, 1 po q d x 3 weeks (Patient taking differently: Take 15 mg by mouth daily as needed ([place] nerve pain).), Disp: 30 tablet, Rfl: 0   meloxicam  (MOBIC ) 7.5 MG tablet, Take 1 tablet (7.5 mg total) by mouth 2 (two) times daily for 2 weeks, then take 1 tablet twice daily as needed., Disp: 60 tablet, Rfl: 0   metroNIDAZOLE  (FLAGYL ) 500 MG tablet, Take 0.5 tablets (250 mg total) by mouth 3 (three) times daily for 7 days., Disp: 11 tablet, Rfl: 0   nystatin  (MYCOSTATIN /NYSTOP ) powder, Apply 1 Application topically 2 (two) times daily., Disp: 60 g, Rfl: 6   nystatin  cream (MYCOSTATIN ), Apply 1 Application topically 2 (two) times daily as needed for 14 days, Disp: 30 g, Rfl: 0   ondansetron  (ZOFRAN ) 4 MG tablet, Take 1 tablet (4  mg total) by mouth every 8 (eight) hours as needed for up to 7 days for Nausea / Vomiting., Disp: 20 tablet, Rfl: 0   ondansetron  (ZOFRAN ) 4 MG tablet, Take 1 tablet (4 mg total) by mouth every 8 (eight) hours as needed for up to 7 days for Nausea / Vomiting., Disp: 20 tablet, Rfl: 0  oseltamivir  (TAMIFLU ) 75 MG capsule, Take 1 capsule (75 mg total) by mouth 2 times daily for 5 days., Disp: 10 capsule, Rfl: 0   oxyCODONE  (OXY IR/ROXICODONE ) 5 MG immediate release tablet, Take 1 tablet (5 mg total) by mouth every 4 (four) hours as needed for breakthrough pain after surgery., Disp: 10 tablet, Rfl: 0   pantoprazole  (PROTONIX ) 20 MG tablet, Take 1 tablet (20 mg total) by mouth daily., Disp: 30 tablet, Rfl: 0   pantoprazole  (PROTONIX ) 20 MG tablet, Take 1 tablet (20 mg total) by mouth daily., Disp: 90 tablet, Rfl: 3   pantoprazole  (PROTONIX ) 40 MG tablet, Take 1 tablet (40 mg total) by mouth in the morning., Disp: 90 tablet, Rfl: 3   pantoprazole  (PROTONIX ) 40 MG tablet, Take 1 tablet (40 mg total) by mouth in the morning., Disp: 90 tablet, Rfl: 3   pantoprazole  (PROTONIX ) 40 MG tablet, Take 1 tablet (40 mg total) by mouth every morning., Disp: 90 tablet, Rfl: 3   pravastatin  (PRAVACHOL ) 20 MG tablet, Take 1 tablet (20 mg total) by mouth at bedtime., Disp: 90 tablet, Rfl: 3   predniSONE  (DELTASONE ) 20 MG tablet, Take 1 tablet (20 mg total) by mouth 2 (two) times daily for 5 days, Disp: 10 tablet, Rfl: 0   pseudoephedrine  (SUDAFED) 30 MG tablet, Take 1-2 tablets by mouth every 4-6 hours as needed for congestion., Disp: 30 tablet, Rfl: 0   rOPINIRole  (REQUIP ) 0.5 MG tablet, Take 0.5 mg by mouth daily as needed (Restless legs). , Disp: , Rfl:    rOPINIRole  (REQUIP ) 0.5 MG tablet, TAKE 1 TABLET (0.5 MG TOTAL) BY MOUTH AT BEDTIME., Disp: 90 tablet, Rfl: 3   rOPINIRole  (REQUIP ) 0.5 MG tablet, Take 1 tablet (0.5 mg total) by mouth at bedtime., Disp: 90 tablet, Rfl: 3   rOPINIRole  (REQUIP ) 0.5 MG tablet, Take 1  tablet (0.5 mg total) by mouth at bedtime., Disp: 90 tablet, Rfl: 3   Semaglutide , 2 MG/DOSE, (OZEMPIC , 2 MG/DOSE,) 8 MG/3ML SOPN, Inject 2 mg into the skin once a week., Disp: 9 mL, Rfl: 3   Semaglutide , 2 MG/DOSE, (OZEMPIC , 2 MG/DOSE,) 8 MG/3ML SOPN, Inject 2 mg into the skin once a week., Disp: 3 mL, Rfl: 0   Semaglutide -Weight Management (WEGOVY ) 0.5 MG/0.5ML SOAJ, Inject 0.5 mL (0.5 mg total) under the skin every 7 days., Disp: 2 mL, Rfl: 0   tirzepatide  (MOUNJARO ) 15 MG/0.5ML Pen, Inject 15 mg into the skin once a week., Disp: 2 mL, Rfl: 3   tirzepatide  (MOUNJARO ) 5 MG/0.5ML Pen, Inject 5 mg into the skin every 7 days, Disp: 2 mL, Rfl: 1   tirzepatide  (MOUNJARO ) 7.5 MG/0.5ML Pen, Inject 7.5 mg into the skin once a week., Disp: 2 mL, Rfl: 1   traMADol  (ULTRAM ) 50 MG tablet, Take 1-2 tablets (50-100 mg total) by mouth every 6 (six) hours as needed for up to 5 days, Disp: 40 tablet, Rfl: 1   Vitamin D , Ergocalciferol , (DRISDOL ) 1.25 MG (50000 UNIT) CAPS capsule, Take 50,000 Units by mouth every 7 (seven) days. Sunday, Disp: , Rfl:    Vitamin D , Ergocalciferol , (DRISDOL ) 1.25 MG (50000 UNIT) CAPS capsule, Take 1 capsule (50,000 Units total) by mouth once a week., Disp: 30 capsule, Rfl: 0   Vitamin D , Ergocalciferol , (DRISDOL ) 1.25 MG (50000 UNIT) CAPS capsule, Take 1 capsule (50,000 Units total) by mouth once a week., Disp: 36 capsule, Rfl: 0  Social History   Tobacco Use  Smoking Status Former   Current packs/day: 0.00   Types:  Cigarettes   Quit date: 12/30/2006   Years since quitting: 17.6  Smokeless Tobacco Never    Allergies  Allergen Reactions   Cephalexin  Swelling   Hydrocodone  Nausea And Vomiting   Rifampin Nausea And Vomiting, Rash and Other (See Comments)    Nausea, Vomiting   Sulfa Antibiotics Hives   Imitrex [Sumatriptan] Nausea And Vomiting   Augmentin  [Amoxicillin -Pot Clavulanate] Nausea And Vomiting   Metformin And Related Other (See Comments)    GI upset   Penicillins  Swelling    Reaction: 2018   Objective:  There were no vitals filed for this visit. There is no height or weight on file to calculate BMI. Constitutional Well developed. Well nourished.  Vascular Dorsalis pedis pulses palpable bilaterally. Posterior tibial pulses palpable bilaterally. Capillary refill normal to all digits.  No cyanosis or clubbing noted. Pedal hair growth normal.  Neurologic Normal speech. Oriented to person, place, and time. Epicritic sensation to light touch grossly present bilaterally.  Dermatologic Nails well groomed and normal in appearance. No open wounds. No skin lesions.  Orthopedic: Pain along the course of the posterior tibial tendon including the insertion pain with resisted plantarflexion inversion of the foot no pain with dorsiflexion eversion of the foot.   Radiographs: IMPRESSION: 1. Synovitis in the sinus tarsi with marrow edema along the talar aspect. Findings can be seen in the setting of sinus tarsi syndrome. 2. Chronic anterior talofibular ligament and calcaneofibular ligament sprain. 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 Posterior tibial tendinitis, right      Plan:  Patient was evaluated and treated and all questions answered.  Right posterior tibial tendinitis/sinus tarsi syndrome -All questions and concerns were discussed with the patient extensive detail cam boot help decrease some of the pain that she was having she still has some residual pain she would benefit from a steroid injection I discussed the risk of rupture associate with this she states understand like to proceed -A steroid injection was performed at right medial foot at point of maximal tenderness using 1% plain Lidocaine  and 10 mg of Kenalog . This was well tolerated. -MRI was reviewed with the patient which did not show any posterior tibial tendon abnormality.  Some component of sinus tarsi  syndrome which her pain is resolved - In the future we can discuss PRP option   Pes planovalgus -I explained to patient the etiology of pes planovalgus and relationship with posterior grade tendinitis and various treatment options were discussed.  Given patient foot structure in the setting of posterior tibial tendinitis I believe patient will benefit from custom-made orthotics to help control the hindfoot motion support the arch of the foot and take the stress away from plantar fascial.  Patient agrees with the plan like to proceed with orthotics -Orthotics are functioning well      No follow-ups on file.

## 2024-08-12 ENCOUNTER — Other Ambulatory Visit (HOSPITAL_BASED_OUTPATIENT_CLINIC_OR_DEPARTMENT_OTHER): Payer: Self-pay

## 2024-08-12 MED ORDER — OZEMPIC (2 MG/DOSE) 8 MG/3ML ~~LOC~~ SOPN
2.0000 mg | PEN_INJECTOR | SUBCUTANEOUS | 0 refills | Status: DC
  Filled 2024-08-12: qty 3, 28d supply, fill #0

## 2024-08-24 DIAGNOSIS — G4733 Obstructive sleep apnea (adult) (pediatric): Secondary | ICD-10-CM | POA: Diagnosis not present

## 2024-08-26 DIAGNOSIS — G4733 Obstructive sleep apnea (adult) (pediatric): Secondary | ICD-10-CM | POA: Diagnosis not present

## 2024-08-31 ENCOUNTER — Other Ambulatory Visit (HOSPITAL_COMMUNITY): Payer: Self-pay

## 2024-08-31 ENCOUNTER — Other Ambulatory Visit: Payer: Self-pay

## 2024-08-31 MED ORDER — ALPRAZOLAM 0.5 MG PO TABS
0.5000 mg | ORAL_TABLET | Freq: Two times a day (BID) | ORAL | 1 refills | Status: AC
  Filled 2024-08-31: qty 180, 90d supply, fill #0
  Filled 2024-12-30 – 2024-12-31 (×2): qty 180, 90d supply, fill #1

## 2024-09-03 DIAGNOSIS — L304 Erythema intertrigo: Secondary | ICD-10-CM | POA: Diagnosis not present

## 2024-09-03 DIAGNOSIS — L814 Other melanin hyperpigmentation: Secondary | ICD-10-CM | POA: Diagnosis not present

## 2024-09-03 DIAGNOSIS — L821 Other seborrheic keratosis: Secondary | ICD-10-CM | POA: Diagnosis not present

## 2024-09-03 DIAGNOSIS — D229 Melanocytic nevi, unspecified: Secondary | ICD-10-CM | POA: Diagnosis not present

## 2024-09-03 DIAGNOSIS — L578 Other skin changes due to chronic exposure to nonionizing radiation: Secondary | ICD-10-CM | POA: Diagnosis not present

## 2024-09-13 ENCOUNTER — Other Ambulatory Visit (HOSPITAL_BASED_OUTPATIENT_CLINIC_OR_DEPARTMENT_OTHER): Payer: Self-pay

## 2024-09-13 MED ORDER — OZEMPIC (2 MG/DOSE) 8 MG/3ML ~~LOC~~ SOPN
2.0000 mg | PEN_INJECTOR | SUBCUTANEOUS | 11 refills | Status: DC
  Filled 2024-09-13 – 2024-10-11 (×2): qty 3, 28d supply, fill #0
  Filled 2024-11-01 – 2024-11-09 (×3): qty 3, 28d supply, fill #1

## 2024-09-24 DIAGNOSIS — G4733 Obstructive sleep apnea (adult) (pediatric): Secondary | ICD-10-CM | POA: Diagnosis not present

## 2024-09-27 ENCOUNTER — Other Ambulatory Visit (HOSPITAL_BASED_OUTPATIENT_CLINIC_OR_DEPARTMENT_OTHER): Payer: Self-pay

## 2024-09-27 MED ORDER — VITAMIN D 1.25 MG (50000 UT) PO CAPS
50000.0000 [IU] | ORAL_CAPSULE | ORAL | 0 refills | Status: AC
  Filled 2024-09-27: qty 12, 84d supply, fill #0
  Filled 2024-12-20: qty 12, 84d supply, fill #1

## 2024-09-30 DIAGNOSIS — E782 Mixed hyperlipidemia: Secondary | ICD-10-CM | POA: Diagnosis not present

## 2024-09-30 DIAGNOSIS — E1165 Type 2 diabetes mellitus with hyperglycemia: Secondary | ICD-10-CM | POA: Diagnosis not present

## 2024-10-06 ENCOUNTER — Other Ambulatory Visit (HOSPITAL_COMMUNITY): Payer: Self-pay

## 2024-10-06 DIAGNOSIS — Z01419 Encounter for gynecological examination (general) (routine) without abnormal findings: Secondary | ICD-10-CM | POA: Diagnosis not present

## 2024-10-06 DIAGNOSIS — B372 Candidiasis of skin and nail: Secondary | ICD-10-CM | POA: Diagnosis not present

## 2024-10-06 MED ORDER — NYSTATIN 100000 UNIT/GM EX CREA
TOPICAL_CREAM | CUTANEOUS | 6 refills | Status: AC
  Filled 2024-10-06: qty 30, 30d supply, fill #0
  Filled 2024-11-15 – 2024-12-07 (×2): qty 30, 30d supply, fill #1

## 2024-10-11 ENCOUNTER — Other Ambulatory Visit (HOSPITAL_COMMUNITY): Payer: Self-pay

## 2024-10-24 DIAGNOSIS — G4733 Obstructive sleep apnea (adult) (pediatric): Secondary | ICD-10-CM | POA: Diagnosis not present

## 2024-11-01 ENCOUNTER — Other Ambulatory Visit (HOSPITAL_COMMUNITY): Payer: Self-pay

## 2024-11-02 ENCOUNTER — Other Ambulatory Visit: Payer: Self-pay

## 2024-11-08 ENCOUNTER — Encounter: Payer: Self-pay | Admitting: Podiatry

## 2024-11-08 ENCOUNTER — Other Ambulatory Visit (HOSPITAL_COMMUNITY): Payer: Self-pay

## 2024-11-09 ENCOUNTER — Other Ambulatory Visit (HOSPITAL_COMMUNITY): Payer: Self-pay

## 2024-11-10 ENCOUNTER — Ambulatory Visit (INDEPENDENT_AMBULATORY_CARE_PROVIDER_SITE_OTHER): Admitting: Podiatry

## 2024-11-10 DIAGNOSIS — M76821 Posterior tibial tendinitis, right leg: Secondary | ICD-10-CM | POA: Diagnosis not present

## 2024-11-10 NOTE — Progress Notes (Signed)
 Subjective:  Patient ID: Stacey Clark, female    DOB: 1971/11/26,  MRN: [id number]  Chief Complaint  Patient presents with   Foot Pain    Pt stated that the pain is about the same she stated that the injection does help for about 2 months     53 y.o. female presents with the above complaint.  Patient presents for follow-up of right posterior tibial tendinitis.  She states that she still has some pain.  She wants to discuss further treatment plan.  She had her MRI completed as well she is here to go over the results  Review of Systems: Negative except as noted in the HPI. Denies N/V/F/Ch.  Past Medical History:  Diagnosis Date   Anxiety    Asthma    Back pain    Complication of anesthesia    Depression    Diabetes mellitus without complication (HCC)    GERD (gastroesophageal reflux disease)    History of bronchitis    HLD (hyperlipidemia)    IBS (irritable bowel syndrome)    Joint pain    Obesity    Pneumonia    PONV (postoperative nausea and vomiting)    Sleep apnea    uses CPAP machine    Current Outpatient Medications:    albuterol  (VENTOLIN  HFA) 108 (90 Base) MCG/ACT inhaler, Inhale 2 puffs into the lungs every 4 (four) hours as needed for wheezing, Disp: 6.7 g, Rfl: 2   albuterol  (VENTOLIN  HFA) 108 (90 Base) MCG/ACT inhaler, Inhale 2 puffs into the lungs every 4 (four) hours as needed for wheezing, Disp: 6.7 g, Rfl: 2   ALPRAZolam  (XANAX ) 0.5 MG tablet, Take 0.5 mg by mouth 2 (two) times daily. , Disp: , Rfl:    ALPRAZolam  (XANAX ) 0.5 MG tablet, Take 1 tablet (0.5 mg total) by mouth 2 (two) times daily., Disp: 180 tablet, Rfl: 1   ALPRAZolam  (XANAX ) 0.5 MG tablet, Take 1 tablet (0.5 mg total) by mouth 2 (two) times daily., Disp: 180 tablet, Rfl: 1   azithromycin  (ZITHROMAX ) 500 MG tablet, Take 1 tablet (500 mg total) by mouth daily for 5 days., Disp: 5 tablet, Rfl: 0   Blood Pressure Monitoring (OMRON 3 SERIES BP MONITOR) DEVI, Use as directed, Disp: 1 each, Rfl: 0    buPROPion  (WELLBUTRIN  SR) 150 MG 12 hr tablet, Take 1 tablet (150 mg total) by mouth 2 (two) times daily., Disp: 60 tablet, Rfl: 0   buPROPion  (WELLBUTRIN  SR) 150 MG 12 hr tablet, Take 1 tablet (150 mg total) by mouth 2 (two) times daily., Disp: 180 tablet, Rfl: 3   buPROPion  (WELLBUTRIN  SR) 150 MG 12 hr tablet, Take 1 tablet (150 mg total) by mouth 2 (two) times daily., Disp: 180 tablet, Rfl: 3   buPROPion  (WELLBUTRIN  SR) 150 MG 12 hr tablet, Take 1 tablet (150 mg total) by mouth 2 (two) times daily., Disp: 180 tablet, Rfl: 3   celecoxib  (CELEBREX ) 200 MG capsule, Take 1 capsule (200 mg total) by mouth 2 (two) times daily., Disp: 60 capsule, Rfl: 2   Cholecalciferol  (D3-50) 1.25 MG (50000 UT) capsule, Take 1 capsule (50,000 Units total) by mouth every 7 (seven) days., Disp: 30 capsule, Rfl: 0   Cholecalciferol  (VITAMIN D ) 1.25 MG (50000 UT) CAPS, Take 50,000 Units by mouth once a week., Disp: 36 capsule, Rfl: 0   Cholecalciferol  (VITAMIN D3) 1.25 MG (50000 UT) CAPS, Take 1 capsule (50,000 Units total) by mouth once a week., Disp: 30 capsule, Rfl: 0   ciprofloxacin  (  CIPRO ) 500 MG tablet, Take 1 tablet (500 mg total) by mouth 2 times daily for 7 days., Disp: 14 tablet, Rfl: 0   clindamycin  (CLEOCIN ) 150 MG capsule, Take 2 capsules now then 1 capsule 3 times a day for dental infection, Disp: 21 capsule, Rfl: 1   diclofenac  Sodium (VOLTAREN ) 1 % GEL, Apply a nickel sized amount to the knee up to every 6 hours as needed.  Wash hands thoroughly after use., Disp: 100 g, Rfl: 0   doxycycline  (VIBRAMYCIN ) 100 MG capsule, Take 1 capsule (100 mg total) by mouth 2 (two) times daily for 10 days., Disp: 20 capsule, Rfl: 0   doxycycline  (VIBRAMYCIN ) 100 MG capsule, Take 1 capsule (100 mg total) by mouth 2 (two) times daily for 10 days, Disp: 20 capsule, Rfl: 0   Dulaglutide  (TRULICITY ) 1.5 MG/0.5ML SOPN, Inject 1.5 mg into the skin every 7 (seven) days., Disp: 6 mL, Rfl: 1   ergocalciferol  (VITAMIN D2) 1.25 MG  (50000 UT) capsule, TAKE 1 CAPSULE BY MOUTH ONCE A WEEK., Disp: 30 capsule, Rfl: 0   escitalopram  (LEXAPRO ) 20 MG tablet, Take 20 mg by mouth at bedtime. , Disp: , Rfl:    escitalopram  (LEXAPRO ) 20 MG tablet, Take 1 tablet (20 mg total) by mouth daily., Disp: 90 tablet, Rfl: 3   escitalopram  (LEXAPRO ) 20 MG tablet, Take 1 tablet (20 mg total) by mouth daily., Disp: 90 tablet, Rfl: 3   ezetimibe  (ZETIA ) 10 MG tablet, Take 1 tablet (10 mg total) by mouth daily., Disp: 90 tablet, Rfl: 3   ezetimibe  (ZETIA ) 10 MG tablet, Take 1 tablet (10 mg total) by mouth daily., Disp: 90 tablet, Rfl: 3   famotidine  (PEPCID ) 40 MG tablet, Take 1 tablet (40 mg total) by mouth at bedtime., Disp: 90 tablet, Rfl: 3   glucose blood (FREESTYLE LITE) test strip, USE TO CHECK BLOOD SUGAR DAILY, Disp: 100 strip, Rfl: 3   glucose blood (FREESTYLE LITE) test strip, Use 1 to check blood sugar daily., Disp: 100 strip, Rfl: 3   glucose blood (FREESTYLE LITE) test strip, USE TO CHECK BLOOD SUGAR DAILY, Disp: 100 strip, Rfl: 3   ibuprofen  (ADVIL ) 600 MG tablet, Take 1 tablet (600 mg total) by mouth every 8 (eight) hours as needed with food. This is for pain and inflammation after surgery., Disp: 30 tablet, Rfl: 0   ibuprofen  (ADVIL ) 800 MG tablet, Take 1 tablet (800 mg total) by mouth every 6-8 hours as needed., Disp: 21 tablet, Rfl: 1   Lancets (FREESTYLE) lancets, Use as directed daily, Disp: 200 each, Rfl: 3   meloxicam  (MOBIC ) 15 MG tablet, 1 po q d x 3 weeks (Patient taking differently: Take 15 mg by mouth daily as needed (si[place] nerve pain).), Disp: 30 tablet, Rfl: 0   meloxicam  (MOBIC ) 7.5 MG tablet, Take 1 tablet (7.5 mg total) by mouth 2 (two) times daily for 2 weeks, then take 1 tablet twice daily as needed., Disp: 60 tablet, Rfl: 0   metroNIDAZOLE  (FLAGYL ) 500 MG tablet, Take 0.5 tablets (250 mg total) by mouth 3 (three) times daily for 7 days., Disp: 11 tablet, Rfl: 0   nystatin  (MYCOSTATIN /NYSTOP ) powder, Apply 1  Application topically 2 (two) times daily., Disp: 60 g, Rfl: 6   nystatin  cream (MYCOSTATIN ), Apply 1 Application topically 2 (two) times daily as needed for 14 days, Disp: 30 g, Rfl: 0   nystatin  cream (MYCOSTATIN ), APPLY TO THE AFFECTED AREA(S) 2 TIMES PER DAY, Disp: 30 g, Rfl: 6   ondansetron  (  ZOFRAN ) 4 MG tablet, Take 1 tablet (4 mg total) by mouth every 8 (eight) hours as needed for up to 7 days for Nausea / Vomiting., Disp: 20 tablet, Rfl: 0   ondansetron  (ZOFRAN ) 4 MG tablet, Take 1 tablet (4 mg total) by mouth every 8 (eight) hours as needed for up to 7 days for Nausea / Vomiting., Disp: 20 tablet, Rfl: 0   oseltamivir  (TAMIFLU ) 75 MG capsule, Take 1 capsule (75 mg total) by mouth 2 times daily for 5 days., Disp: 10 capsule, Rfl: 0   oxyCODONE  (OXY IR/ROXICODONE ) 5 MG immediate release tablet, Take 1 tablet (5 mg total) by mouth every 4 (four) hours as needed for breakthrough pain after surgery., Disp: 10 tablet, Rfl: 0   pantoprazole  (PROTONIX ) 20 MG tablet, Take 1 tablet (20 mg total) by mouth daily., Disp: 30 tablet, Rfl: 0   pantoprazole  (PROTONIX ) 20 MG tablet, Take 1 tablet (20 mg total) by mouth daily., Disp: 90 tablet, Rfl: 3   pantoprazole  (PROTONIX ) 40 MG tablet, Take 1 tablet (40 mg total) by mouth in the morning., Disp: 90 tablet, Rfl: 3   pantoprazole  (PROTONIX ) 40 MG tablet, Take 1 tablet (40 mg total) by mouth in the morning., Disp: 90 tablet, Rfl: 3   pantoprazole  (PROTONIX ) 40 MG tablet, Take 1 tablet (40 mg total) by mouth every morning., Disp: 90 tablet, Rfl: 3   pravastatin  (PRAVACHOL ) 20 MG tablet, Take 1 tablet (20 mg total) by mouth at bedtime., Disp: 90 tablet, Rfl: 3   predniSONE  (DELTASONE ) 20 MG tablet, Take 1 tablet (20 mg total) by mouth 2 (two) times daily for 5 days, Disp: 10 tablet, Rfl: 0   pseudoephedrine  (SUDAFED) 30 MG tablet, Take 1-2 tablets by mouth every 4-6 hours as needed for congestion., Disp: 30 tablet, Rfl: 0   rOPINIRole  (REQUIP ) 0.5 MG tablet,  Take 0.5 mg by mouth daily as needed (Restless legs). , Disp: , Rfl:    rOPINIRole  (REQUIP ) 0.5 MG tablet, TAKE 1 TABLET (0.5 MG TOTAL) BY MOUTH AT BEDTIME., Disp: 90 tablet, Rfl: 3   rOPINIRole  (REQUIP ) 0.5 MG tablet, Take 1 tablet (0.5 mg total) by mouth at bedtime., Disp: 90 tablet, Rfl: 3   rOPINIRole  (REQUIP ) 0.5 MG tablet, Take 1 tablet (0.5 mg total) by mouth at bedtime., Disp: 90 tablet, Rfl: 3   Semaglutide , 2 MG/DOSE, (OZEMPIC , 2 MG/DOSE,) 8 MG/3ML SOPN, Inject 2 mg into the skin once a week., Disp: 9 mL, Rfl: 3   Semaglutide , 2 MG/DOSE, (OZEMPIC , 2 MG/DOSE,) 8 MG/3ML SOPN, Inject 2 mg into the skin once a week., Disp: 3 mL, Rfl: 11   Semaglutide -Weight Management (WEGOVY ) 0.5 MG/0.5ML SOAJ, Inject 0.5 mL (0.5 mg total) under the skin every 7 days., Disp: 2 mL, Rfl: 0   tirzepatide  (MOUNJARO ) 15 MG/0.5ML Pen, Inject 15 mg into the skin once a week., Disp: 2 mL, Rfl: 3   tirzepatide  (MOUNJARO ) 5 MG/0.5ML Pen, Inject 5 mg into the skin every 7 days, Disp: 2 mL, Rfl: 1   tirzepatide  (MOUNJARO ) 7.5 MG/0.5ML Pen, Inject 7.5 mg into the skin once a week., Disp: 2 mL, Rfl: 1   traMADol  (ULTRAM ) 50 MG tablet, Take 1-2 tablets (50-100 mg total) by mouth every 6 (six) hours as needed for up to 5 days, Disp: 40 tablet, Rfl: 1   Vitamin D , Ergocalciferol , (DRISDOL ) 1.25 MG (50000 UNIT) CAPS capsule, Take 50,000 Units by mouth every 7 (seven) days. Sunday, Disp: , Rfl:    Vitamin D , Ergocalciferol , (DRISDOL )  1.25 MG (50000 UNIT) CAPS capsule, Take 1 capsule (50,000 Units total) by mouth once a week., Disp: 30 capsule, Rfl: 0   Vitamin D , Ergocalciferol , (DRISDOL ) 1.25 MG (50000 UNIT) CAPS capsule, Take 1 capsule (50,000 Units total) by mouth once a week., Disp: 36 capsule, Rfl: 0  Social History   Tobacco Use  Smoking Status Former   Current packs/day: 0.00   Types: Cigarettes   Quit date: 12/30/2006   Years since quitting: 17.8  Smokeless Tobacco Never    Allergies  Allergen Reactions    Cephalexin  Swelling   Hydrocodone  Nausea And Vomiting   Rifampin Nausea And Vomiting, Rash and Other (See Comments)    Nausea, Vomiting   Sulfa Antibiotics Hives   Imitrex [Sumatriptan] Nausea And Vomiting   Augmentin  [Amoxicillin -Pot Clavulanate] Nausea And Vomiting   Metformin And Related Other (See Comments)    GI upset   Penicillins Swelling    Reaction: 2018   Objective:  There were no vitals filed for this visit. There is no height or weight on file to calculate BMI. Constitutional Well developed. Well nourished.  Vascular Dorsalis pedis pulses palpable bilaterally. Posterior tibial pulses palpable bilaterally. Capillary refill normal to all digits.  No cyanosis or clubbing noted. Pedal hair growth normal.  Neurologic Normal speech. Oriented to person, place, and time. Epicritic sensation to light touch grossly present bilaterally.  Dermatologic Nails well groomed and normal in appearance. No open wounds. No skin lesions.  Orthopedic: Pain along the course of the posterior tibial tendon including the insertion pain with resisted plantarflexion inversion of the foot no pain with dorsiflexion eversion of the foot.   Radiographs: IMPRESSION: 1. Synovitis in the sinus tarsi with marrow edema along the talar aspect. Findings can be seen in the setting of sinus tarsi syndrome. 2. Chronic anterior talofibular ligament and calcaneofibular ligament sprain. 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 Assessment:   1. Posterior tibial tendinitis, right       Plan:  Patient was evaluated and treated and all questions answered.  Right posterior tibial tendinitis/sinus tarsi syndrome -All questions and concerns were discussed with the patient extensive detail cam boot help decrease some of the pain that she was having she still has some residual pain she would benefit from a steroid injection I discussed the risk of rupture associate with this she states understand  like to proceed -A steroid injection was performed at right medial foot at point of maximal tenderness using 1% plain Lidocaine  and 10 mg of Kenalog . This was well tolerated. -MRI was reviewed with the patient which did not show any posterior tibial tendon abnormality.  Some component of sinus tarsi syndrome which her pain is resolved - In the future we can discuss PRP option   Pes planovalgus -I explained to patient the etiology of pes planovalgus and relationship with posterior grade tendinitis and various treatment options were discussed.  Given patient foot structure in the setting of posterior tibial tendinitis I believe patient will benefit from custom-made orthotics to help control the hindfoot motion support the arch of the foot and take the stress away from plantar fascial.  Patient agrees with the plan like to proceed with orthotics -Orthotics are functioning well      No follow-ups on file.

## 2024-11-16 ENCOUNTER — Other Ambulatory Visit: Payer: Self-pay

## 2024-11-16 ENCOUNTER — Other Ambulatory Visit (HOSPITAL_BASED_OUTPATIENT_CLINIC_OR_DEPARTMENT_OTHER): Payer: Self-pay

## 2024-11-16 ENCOUNTER — Encounter: Payer: Self-pay | Admitting: Pharmacist

## 2024-11-16 MED ORDER — ZEPBOUND 2.5 MG/0.5ML ~~LOC~~ SOAJ
2.5000 mg | SUBCUTANEOUS | 0 refills | Status: DC
  Filled 2024-11-16: qty 2, 28d supply, fill #0

## 2024-11-19 ENCOUNTER — Other Ambulatory Visit: Payer: Self-pay

## 2024-11-22 ENCOUNTER — Other Ambulatory Visit (HOSPITAL_BASED_OUTPATIENT_CLINIC_OR_DEPARTMENT_OTHER): Payer: Self-pay

## 2024-11-22 ENCOUNTER — Other Ambulatory Visit (HOSPITAL_COMMUNITY): Payer: Self-pay

## 2024-11-22 MED ORDER — AZITHROMYCIN 500 MG PO TABS
500.0000 mg | ORAL_TABLET | Freq: Every day | ORAL | 0 refills | Status: AC
  Filled 2024-11-22 (×2): qty 5, 5d supply, fill #0

## 2024-11-24 DIAGNOSIS — G4733 Obstructive sleep apnea (adult) (pediatric): Secondary | ICD-10-CM | POA: Diagnosis not present

## 2024-12-07 ENCOUNTER — Other Ambulatory Visit (HOSPITAL_BASED_OUTPATIENT_CLINIC_OR_DEPARTMENT_OTHER): Payer: Self-pay

## 2024-12-07 ENCOUNTER — Other Ambulatory Visit (HOSPITAL_COMMUNITY): Payer: Self-pay

## 2024-12-07 ENCOUNTER — Other Ambulatory Visit: Payer: Self-pay

## 2024-12-07 MED ORDER — OZEMPIC (2 MG/DOSE) 8 MG/3ML ~~LOC~~ SOPN
2.0000 mg | PEN_INJECTOR | SUBCUTANEOUS | 11 refills | Status: AC
  Filled 2024-12-07: qty 3, 28d supply, fill #0
  Filled 2024-12-30 – 2024-12-31 (×2): qty 3, 28d supply, fill #1

## 2024-12-20 ENCOUNTER — Other Ambulatory Visit (HOSPITAL_BASED_OUTPATIENT_CLINIC_OR_DEPARTMENT_OTHER): Payer: Self-pay

## 2024-12-21 ENCOUNTER — Other Ambulatory Visit (HOSPITAL_COMMUNITY): Payer: Self-pay

## 2024-12-21 MED ORDER — PANTOPRAZOLE SODIUM 40 MG PO TBEC
40.0000 mg | DELAYED_RELEASE_TABLET | Freq: Every morning | ORAL | 3 refills | Status: AC
  Filled 2024-12-30 – 2025-01-31 (×2): qty 90, 90d supply, fill #0

## 2024-12-21 MED ORDER — FAMOTIDINE 40 MG PO TABS
40.0000 mg | ORAL_TABLET | Freq: Every day | ORAL | 3 refills | Status: AC
  Filled 2025-01-31: qty 90, 90d supply, fill #0

## 2024-12-22 ENCOUNTER — Other Ambulatory Visit (HOSPITAL_BASED_OUTPATIENT_CLINIC_OR_DEPARTMENT_OTHER): Payer: Self-pay

## 2024-12-22 DIAGNOSIS — Z0000 Encounter for general adult medical examination without abnormal findings: Secondary | ICD-10-CM | POA: Diagnosis not present

## 2024-12-22 MED ORDER — VRAYLAR 1.5 MG PO CAPS
1.5000 mg | ORAL_CAPSULE | Freq: Every day | ORAL | 0 refills | Status: DC
  Filled 2024-12-22: qty 30, 30d supply, fill #0

## 2024-12-24 DIAGNOSIS — G4733 Obstructive sleep apnea (adult) (pediatric): Secondary | ICD-10-CM | POA: Diagnosis not present

## 2024-12-30 ENCOUNTER — Other Ambulatory Visit (HOSPITAL_COMMUNITY): Payer: Self-pay

## 2024-12-31 ENCOUNTER — Encounter: Payer: Self-pay | Admitting: Pharmacist

## 2024-12-31 ENCOUNTER — Encounter (HOSPITAL_COMMUNITY): Payer: Self-pay | Admitting: Pharmacist

## 2024-12-31 ENCOUNTER — Other Ambulatory Visit (HOSPITAL_BASED_OUTPATIENT_CLINIC_OR_DEPARTMENT_OTHER): Payer: Self-pay

## 2024-12-31 ENCOUNTER — Other Ambulatory Visit: Payer: Self-pay

## 2024-12-31 ENCOUNTER — Other Ambulatory Visit (HOSPITAL_COMMUNITY): Payer: Self-pay

## 2024-12-31 MED ORDER — ALBUTEROL SULFATE HFA 108 (90 BASE) MCG/ACT IN AERS
2.0000 | INHALATION_SPRAY | RESPIRATORY_TRACT | 2 refills | Status: AC | PRN
  Filled 2024-12-31: qty 6.7, 25d supply, fill #0

## 2024-12-31 MED ORDER — ONDANSETRON HCL 4 MG PO TABS
4.0000 mg | ORAL_TABLET | Freq: Three times a day (TID) | ORAL | 0 refills | Status: AC | PRN
  Filled 2024-12-31: qty 20, 7d supply, fill #0

## 2025-01-04 ENCOUNTER — Other Ambulatory Visit (HOSPITAL_COMMUNITY): Payer: Self-pay

## 2025-01-05 ENCOUNTER — Other Ambulatory Visit (HOSPITAL_BASED_OUTPATIENT_CLINIC_OR_DEPARTMENT_OTHER): Payer: Self-pay

## 2025-01-12 ENCOUNTER — Other Ambulatory Visit: Payer: Self-pay | Admitting: Family Medicine

## 2025-01-12 DIAGNOSIS — Z1231 Encounter for screening mammogram for malignant neoplasm of breast: Secondary | ICD-10-CM

## 2025-01-19 ENCOUNTER — Other Ambulatory Visit (HOSPITAL_BASED_OUTPATIENT_CLINIC_OR_DEPARTMENT_OTHER): Payer: Self-pay

## 2025-01-19 MED ORDER — VRAYLAR 1.5 MG PO CAPS
1.5000 mg | ORAL_CAPSULE | Freq: Every day | ORAL | 0 refills | Status: AC
  Filled 2025-01-19: qty 30, 30d supply, fill #0

## 2025-01-24 ENCOUNTER — Other Ambulatory Visit (HOSPITAL_COMMUNITY): Payer: Self-pay

## 2025-01-31 ENCOUNTER — Other Ambulatory Visit (HOSPITAL_COMMUNITY): Payer: Self-pay

## 2025-01-31 ENCOUNTER — Other Ambulatory Visit (HOSPITAL_BASED_OUTPATIENT_CLINIC_OR_DEPARTMENT_OTHER): Payer: Self-pay

## 2025-02-01 ENCOUNTER — Other Ambulatory Visit (HOSPITAL_COMMUNITY): Payer: Self-pay

## 2025-02-01 ENCOUNTER — Other Ambulatory Visit: Payer: Self-pay

## 2025-02-01 ENCOUNTER — Other Ambulatory Visit (HOSPITAL_BASED_OUTPATIENT_CLINIC_OR_DEPARTMENT_OTHER): Payer: Self-pay

## 2025-02-01 MED ORDER — SEMAGLUTIDE-WEIGHT MANAGEMENT 25 MG PO TABS
25.0000 mg | ORAL_TABLET | Freq: Every day | ORAL | 3 refills | Status: AC
  Filled 2025-02-01: qty 30, 30d supply, fill #0

## 2025-02-09 ENCOUNTER — Ambulatory Visit
